# Patient Record
Sex: Female | Born: 1955
Health system: Southern US, Community
[De-identification: ages and names within clinical notes are randomized; demographics above are authoritative.]

## PROBLEM LIST (undated history)

## (undated) DIAGNOSIS — E039 Hypothyroidism, unspecified: Secondary | ICD-10-CM

## (undated) DIAGNOSIS — E7800 Pure hypercholesterolemia, unspecified: Secondary | ICD-10-CM

## (undated) DIAGNOSIS — K219 Gastro-esophageal reflux disease without esophagitis: Secondary | ICD-10-CM

## (undated) DIAGNOSIS — Z8719 Personal history of other diseases of the digestive system: Secondary | ICD-10-CM

## (undated) DIAGNOSIS — Z87448 Personal history of other diseases of urinary system: Secondary | ICD-10-CM

## (undated) DIAGNOSIS — O00109 Unspecified tubal pregnancy without intrauterine pregnancy: Secondary | ICD-10-CM

## (undated) DIAGNOSIS — L570 Actinic keratosis: Secondary | ICD-10-CM

## (undated) DIAGNOSIS — D1771 Benign lipomatous neoplasm of kidney: Secondary | ICD-10-CM

## (undated) DIAGNOSIS — R011 Cardiac murmur, unspecified: Secondary | ICD-10-CM

## (undated) DIAGNOSIS — I10 Essential (primary) hypertension: Secondary | ICD-10-CM

## (undated) DIAGNOSIS — K760 Fatty (change of) liver, not elsewhere classified: Secondary | ICD-10-CM

## (undated) DIAGNOSIS — D352 Benign neoplasm of pituitary gland: Secondary | ICD-10-CM

## (undated) DIAGNOSIS — E220 Acromegaly and pituitary gigantism: Secondary | ICD-10-CM

## (undated) DIAGNOSIS — Z973 Presence of spectacles and contact lenses: Secondary | ICD-10-CM

## (undated) HISTORY — DX: Actinic keratosis: L57.0

## (undated) HISTORY — DX: Unspecified tubal pregnancy without intrauterine pregnancy: O00.109

## (undated) HISTORY — DX: Pure hypercholesterolemia, unspecified: E78.00

## (undated) HISTORY — DX: Benign lipomatous neoplasm of kidney: D17.71

## (undated) HISTORY — DX: Benign neoplasm of pituitary gland: D35.2

## (undated) HISTORY — DX: Essential (primary) hypertension: I10

## (undated) HISTORY — DX: Hypothyroidism, unspecified: E03.9

## (undated) HISTORY — PX: DILATION AND CURETTAGE OF UTERUS: SHX78

## (undated) HISTORY — DX: Gastro-esophageal reflux disease without esophagitis: K21.9

## (undated) HISTORY — PX: APPENDECTOMY: SHX54

---

## 2001-01-26 HISTORY — PX: ABDOMINAL HYSTERECTOMY: SHX81

## 2003-01-27 HISTORY — PX: CHOLECYSTECTOMY: SHX55

## 2003-08-28 ENCOUNTER — Other Ambulatory Visit: Payer: Self-pay

## 2004-01-27 HISTORY — PX: OTHER SURGICAL HISTORY: SHX169

## 2004-04-15 ENCOUNTER — Inpatient Hospital Stay: Payer: Self-pay | Admitting: Internal Medicine

## 2004-06-19 ENCOUNTER — Ambulatory Visit: Payer: Self-pay | Admitting: Internal Medicine

## 2004-07-01 ENCOUNTER — Ambulatory Visit: Payer: Self-pay | Admitting: Neurosurgery

## 2004-12-04 ENCOUNTER — Ambulatory Visit: Payer: Self-pay | Admitting: Internal Medicine

## 2005-07-13 ENCOUNTER — Ambulatory Visit: Payer: Self-pay | Admitting: Internal Medicine

## 2005-07-14 ENCOUNTER — Ambulatory Visit: Payer: Self-pay | Admitting: Internal Medicine

## 2005-11-23 ENCOUNTER — Ambulatory Visit: Payer: Self-pay | Admitting: Neurosurgery

## 2006-03-30 ENCOUNTER — Ambulatory Visit: Payer: Self-pay | Admitting: Internal Medicine

## 2006-05-11 ENCOUNTER — Ambulatory Visit: Payer: Self-pay | Admitting: Surgery

## 2006-05-11 ENCOUNTER — Other Ambulatory Visit: Payer: Self-pay

## 2006-06-01 ENCOUNTER — Ambulatory Visit: Payer: Self-pay | Admitting: Surgery

## 2006-08-03 ENCOUNTER — Ambulatory Visit: Payer: Self-pay | Admitting: Internal Medicine

## 2007-01-10 ENCOUNTER — Ambulatory Visit: Payer: Self-pay | Admitting: Internal Medicine

## 2007-01-11 ENCOUNTER — Inpatient Hospital Stay: Payer: Self-pay | Admitting: Specialist

## 2007-02-17 ENCOUNTER — Ambulatory Visit: Payer: Self-pay | Admitting: Gastroenterology

## 2007-08-02 ENCOUNTER — Ambulatory Visit: Payer: Self-pay | Admitting: Neurosurgery

## 2007-08-25 ENCOUNTER — Ambulatory Visit: Payer: Self-pay | Admitting: Internal Medicine

## 2007-08-26 ENCOUNTER — Ambulatory Visit: Payer: Self-pay | Admitting: Internal Medicine

## 2008-03-22 ENCOUNTER — Ambulatory Visit: Payer: Self-pay | Admitting: Internal Medicine

## 2008-09-20 ENCOUNTER — Ambulatory Visit: Payer: Self-pay | Admitting: Unknown Physician Specialty

## 2008-10-24 ENCOUNTER — Ambulatory Visit: Payer: Self-pay | Admitting: Internal Medicine

## 2009-03-01 ENCOUNTER — Ambulatory Visit: Payer: Self-pay | Admitting: Internal Medicine

## 2009-03-26 ENCOUNTER — Ambulatory Visit: Payer: Self-pay | Admitting: Internal Medicine

## 2009-04-26 ENCOUNTER — Ambulatory Visit: Payer: Self-pay | Admitting: Internal Medicine

## 2009-05-26 ENCOUNTER — Ambulatory Visit: Payer: Self-pay | Admitting: Internal Medicine

## 2009-06-26 ENCOUNTER — Ambulatory Visit: Payer: Self-pay | Admitting: Internal Medicine

## 2009-07-26 ENCOUNTER — Ambulatory Visit: Payer: Self-pay | Admitting: Internal Medicine

## 2009-08-30 ENCOUNTER — Ambulatory Visit: Payer: Self-pay | Admitting: Internal Medicine

## 2009-09-26 ENCOUNTER — Ambulatory Visit: Payer: Self-pay | Admitting: Internal Medicine

## 2009-11-06 ENCOUNTER — Ambulatory Visit: Payer: Self-pay | Admitting: Internal Medicine

## 2009-12-04 ENCOUNTER — Ambulatory Visit: Payer: Self-pay | Admitting: Gastroenterology

## 2010-01-23 ENCOUNTER — Ambulatory Visit: Payer: Self-pay | Admitting: Internal Medicine

## 2010-07-28 ENCOUNTER — Ambulatory Visit: Payer: Self-pay | Admitting: Urology

## 2010-09-10 ENCOUNTER — Ambulatory Visit: Payer: Self-pay

## 2010-11-27 ENCOUNTER — Ambulatory Visit: Payer: Self-pay | Admitting: Internal Medicine

## 2011-04-22 ENCOUNTER — Emergency Department: Payer: Self-pay | Admitting: *Deleted

## 2011-04-22 LAB — CBC
HCT: 38.9 % (ref 35.0–47.0)
HGB: 13.3 g/dL (ref 12.0–16.0)
MCH: 31.4 pg (ref 26.0–34.0)
MCHC: 34.2 g/dL (ref 32.0–36.0)
RBC: 4.23 10*6/uL (ref 3.80–5.20)
RDW: 14.1 % (ref 11.5–14.5)

## 2011-04-22 LAB — BASIC METABOLIC PANEL
Anion Gap: 11 (ref 7–16)
Calcium, Total: 9.4 mg/dL (ref 8.5–10.1)
EGFR (Non-African Amer.): 51 — ABNORMAL LOW
Glucose: 100 mg/dL — ABNORMAL HIGH (ref 65–99)
Sodium: 143 mmol/L (ref 136–145)

## 2011-04-22 LAB — CK TOTAL AND CKMB (NOT AT ARMC): CK-MB: 1.6 ng/mL (ref 0.5–3.6)

## 2011-04-22 LAB — TROPONIN I: Troponin-I: <0.02 ng/mL

## 2011-10-27 ENCOUNTER — Telehealth: Payer: Self-pay | Admitting: Internal Medicine

## 2011-10-27 NOTE — Telephone Encounter (Signed)
Pt called wanting to know if you could call her in something for a sinus infection or does she need to be seen [facility]

## 2011-10-28 NOTE — Telephone Encounter (Signed)
Pt is wondering about refill request and she just went over to the walk-in clinic today.

## 2011-10-28 NOTE — Telephone Encounter (Signed)
I do rec an evaluation.  I can see her tomorrow afternoon at 2:30 - to be worked in for this problem.  I would like for her to bring in a list of her meds and while she is here if we can go ahead and give her a new pt packet.  Thanks.

## 2011-10-29 ENCOUNTER — Telehealth: Payer: Self-pay

## 2011-10-29 MED ORDER — ESTROGENS CONJUGATED 0.625 MG PO TABS: 0.6250 mg | ORAL_TABLET | Freq: Every day | ORAL | Status: DC

## 2011-10-29 NOTE — Telephone Encounter (Signed)
RX faxed- [facility]

## 2011-10-29 NOTE — Telephone Encounter (Signed)
Left a message for patient to return our call.

## 2011-10-29 NOTE — Telephone Encounter (Signed)
What meds does she need.  She has an appt scheduled with me, so i am ok to refill her regular meds until her appt. thanks

## 2011-11-05 NOTE — Telephone Encounter (Signed)
Did a follow up call on the patient medication request. Left vm for patient to call me back.

## 2011-11-24 ENCOUNTER — Other Ambulatory Visit: Payer: Self-pay | Admitting: *Deleted

## 2011-11-24 ENCOUNTER — Ambulatory Visit (INDEPENDENT_AMBULATORY_CARE_PROVIDER_SITE_OTHER): Payer: PRIVATE HEALTH INSURANCE | Admitting: Internal Medicine

## 2011-11-24 ENCOUNTER — Encounter: Payer: Self-pay | Admitting: Internal Medicine

## 2011-11-24 VITALS — BP 100/62 | HR 60 | Temp 98.2°F | Ht 65.0 in | Wt 176.0 lb

## 2011-11-24 DIAGNOSIS — Z139 Encounter for screening, unspecified: Secondary | ICD-10-CM

## 2011-11-24 DIAGNOSIS — D3000 Benign neoplasm of unspecified kidney: Secondary | ICD-10-CM

## 2011-11-24 DIAGNOSIS — D1771 Benign lipomatous neoplasm of kidney: Secondary | ICD-10-CM

## 2011-11-24 DIAGNOSIS — K7689 Other specified diseases of liver: Secondary | ICD-10-CM

## 2011-11-24 DIAGNOSIS — E7800 Pure hypercholesterolemia, unspecified: Secondary | ICD-10-CM

## 2011-11-24 DIAGNOSIS — K760 Fatty (change of) liver, not elsewhere classified: Secondary | ICD-10-CM

## 2011-11-24 DIAGNOSIS — E039 Hypothyroidism, unspecified: Secondary | ICD-10-CM

## 2011-11-24 DIAGNOSIS — D352 Benign neoplasm of pituitary gland: Secondary | ICD-10-CM

## 2011-11-24 DIAGNOSIS — I10 Essential (primary) hypertension: Secondary | ICD-10-CM

## 2011-11-24 MED ORDER — ESTRADIOL 0.5 MG PO TABS: 0.5000 mg | ORAL_TABLET | Freq: Every day | ORAL | Status: DC

## 2011-11-24 MED ORDER — PRAVASTATIN SODIUM 40 MG PO TABS: 40.0000 mg | ORAL_TABLET | Freq: Every day | ORAL | Status: DC

## 2011-11-24 NOTE — Patient Instructions (Addendum)
It ws nice seeing you today.  I am glad you have been doing well.  We will get you mammogram scheduled and I will give you the form for your labs.  We will notify you of the results - when they are available.

## 2011-11-25 ENCOUNTER — Telehealth: Payer: Self-pay | Admitting: *Deleted

## 2011-11-25 NOTE — Telephone Encounter (Signed)
[facility] pharmacy with refill request.

## 2011-12-05 ENCOUNTER — Encounter: Payer: Self-pay | Admitting: Internal Medicine

## 2011-12-05 DIAGNOSIS — D1771 Benign lipomatous neoplasm of kidney: Secondary | ICD-10-CM | POA: Insufficient documentation

## 2011-12-05 DIAGNOSIS — K760 Fatty (change of) liver, not elsewhere classified: Secondary | ICD-10-CM | POA: Insufficient documentation

## 2011-12-05 DIAGNOSIS — E039 Hypothyroidism, unspecified: Secondary | ICD-10-CM | POA: Insufficient documentation

## 2011-12-05 DIAGNOSIS — D352 Benign neoplasm of pituitary gland: Secondary | ICD-10-CM | POA: Insufficient documentation

## 2011-12-05 NOTE — Assessment & Plan Note (Signed)
Blood pressure under good control.  Same meds.  Check metabolic panel with next labs.    

## 2011-12-05 NOTE — Assessment & Plan Note (Signed)
On Synthroid.  Check tsh with next labs.  

## 2011-12-05 NOTE — Progress Notes (Signed)
  Subjective:    Patient ID: Marie Colon, female    DOB: [birth year], 56 y.o.   MRN: [id number]  HPI 56 year old female with past history of hypercholesterolemia, hypertension, hypothyroidism, abnormal liver function and a pituitary macroadenoma with associated acromegaly (elevated growth hormone).  She comes in today to follow up on these issues as well as for a complete physical exam.  Overally doing well.  Recently had a sinus infection.  Treated with a Zpak.  Doing better. No increased cough or congestion currently.  Breathing stable.   Still seeing Dr Tedd Sias. Last visit 8/13.  Has follow up planned in 2/14.  Labs stable - per report.  Wants to change premarin to estradiol.  Cheaper.  Handling stress relatively well.  Overall she feels she is doing well.   Past Medical History  Diagnosis Date  . Hypertension   . Hypercholesterolemia   . Hypothyroidism   . Pituitary macroadenoma     s/p removal (elevated growth hormone)  . GERD (gastroesophageal reflux disease)   . Tubal pregnancy     s/p rupture  . Angiomyolipoma of kidney     evaluated by Dr Lonna Cobb    Review of Systems Patient denies any headache, lightheadedness or dizziness.  No significant sinus or allergy symptoms currently.   No chest pain, tightness or palpitations.  No increased shortness of breath, cough or congestion.  Reflux controlled.   No nausea or vomiting.  No abdominal pain or cramping.  No bowel change, such as diarrhea, constipation, BRBPR or melana.  No urine change.        Objective:   Physical Exam Filed Vitals:   11/24/11 0855  BP: 100/62  Pulse: 60  Temp: 98.2 F (34.60 C)   56 year old female in no acute distress.   HEENT:  Nares- clear.  Oropharynx - without lesions. NECK:  Supple.  Nontender.  No audible bruit.  HEART:  Appears to be regular. LUNGS:  No crackles or wheezing audible.  Respirations even and unlabored.  RADIAL PULSE:  Equal bilaterally.    BREASTS:  No nipple discharge or nipple  retraction present.  Could not appreciate any distinct nodules or axillary adenopathy.  ABDOMEN:  Soft, nontender.  Bowel sounds present and normal.  No audible abdominal bruit.  GU:  Normal external genitalia.  Vaginal vault without lesions.  S/P hysterectomy.  Could not appreciate any adnexal masses or tenderness.   RECTAL:  Heme negative.   EXTREMITIES:  No increased edema present.  DP pulses palpable and equal bilaterally.           Assessment & Plan:  CARDIOVASCULAR.  Currently asymptomatic.  Continue risk factor modification.  Stress echo 07/29/11-  Normal stress ECHO with no evidence of ischemia.    INCREASED PSYCHOSOCIAL STRESSORS.  Doing well.  Follow.   GYN.  Wants to remain on estrogen.  Wants to change premarin to Estradiol.  Will change to .5mg  of Estradiol.  Follow.     HEALTH MAINTENANCE.  Last mammogram 11/27/10 - BiRADS I.  Schedule mammogram when due.  Is s/p hysterectomy.  Will notify me when agreeable for bone density.

## 2011-12-05 NOTE — Assessment & Plan Note (Signed)
Liver function has been stable.  Follows with GI.  Abdominal ultrasound 12/04/09 - fatty liver.  Follow liver function.

## 2011-12-05 NOTE — Assessment & Plan Note (Signed)
Found incidentally on a previous CT/ultrasound.  Last CT (07/28/10) revealed - lower pole right renal angiomyolipoma.  Reviewed by Dr Stoioff.  Rec follow up periodic ultrasound.   

## 2011-12-05 NOTE — Assessment & Plan Note (Addendum)
S/P removal (Dr Dwyane Luo) at [facility].  Elevated growth hormone.  History of acromegaly.  Followed by Dr Tedd Sias.  On Cabergoline.  Doing well.  Last MRI (pituitary) - revealed no evidence of residual or recurrent disease - (09/10/10).  Has follow up planned with Dr Tedd Sias 2/14.

## 2011-12-05 NOTE — Assessment & Plan Note (Signed)
On Pravastatin.  Low cholesterol diet and exercise.  Follow lipid panel and liver function.    

## 2011-12-14 ENCOUNTER — Encounter: Payer: Self-pay | Admitting: Internal Medicine

## 2011-12-14 DIAGNOSIS — Z8371 Family history of colonic polyps: Secondary | ICD-10-CM

## 2011-12-14 DIAGNOSIS — K2970 Gastritis, unspecified, without bleeding: Secondary | ICD-10-CM

## 2011-12-18 ENCOUNTER — Telehealth: Payer: Self-pay | Admitting: Internal Medicine

## 2011-12-18 NOTE — Telephone Encounter (Signed)
Triamterene tctz 37.5-25 90 tab [facility]

## 2011-12-18 NOTE — Telephone Encounter (Signed)
metoprolol tartrate 50mg  180 tab Take one tablet by mouth 2 times day [facility]

## 2011-12-21 ENCOUNTER — Ambulatory Visit: Payer: Self-pay | Admitting: Internal Medicine

## 2011-12-21 ENCOUNTER — Telehealth: Payer: Self-pay | Admitting: General Practice

## 2011-12-21 NOTE — Telephone Encounter (Signed)
Pt called stating she needs Rx for Metroprolol and Traimterene/HCTZ sent to [facility]. Also states that if possible could she go ahead and get refills for the remainder of her meds as well. Please send Faxed meds to [phone].

## 2011-12-22 ENCOUNTER — Other Ambulatory Visit: Payer: Self-pay | Admitting: *Deleted

## 2011-12-22 ENCOUNTER — Telehealth: Payer: Self-pay | Admitting: *Deleted

## 2011-12-22 MED ORDER — TRIAMTERENE-HCTZ 37.5-25 MG PO CAPS: 1.0000 | ORAL_CAPSULE | Freq: Every day | ORAL | Status: DC

## 2011-12-22 MED ORDER — LEVOTHYROXINE SODIUM 100 MCG PO TABS: 100.0000 ug | ORAL_TABLET | Freq: Every day | ORAL | Status: DC

## 2011-12-22 MED ORDER — PRAVASTATIN SODIUM 40 MG PO TABS: 40.0000 mg | ORAL_TABLET | Freq: Every day | ORAL | Status: DC

## 2011-12-22 MED ORDER — LANSOPRAZOLE 15 MG PO CPDR: 15.0000 mg | DELAYED_RELEASE_CAPSULE | Freq: Every day | ORAL | Status: DC

## 2011-12-22 MED ORDER — ESTRADIOL 0.5 MG PO TABS: 0.5000 mg | ORAL_TABLET | Freq: Every day | ORAL | Status: DC

## 2011-12-22 MED ORDER — METOPROLOL TARTRATE 50 MG PO TABS: 50.0000 mg | ORAL_TABLET | Freq: Two times a day (BID) | ORAL | Status: DC

## 2011-12-22 NOTE — Telephone Encounter (Signed)
Refilled Lopressor 50 mg bid and Dyazide 37.5 - 25 mg daily

## 2011-12-22 NOTE — Telephone Encounter (Signed)
Refilled patient scripts

## 2011-12-23 ENCOUNTER — Telehealth: Payer: Self-pay | Admitting: Internal Medicine

## 2011-12-23 MED ORDER — METOPROLOL TARTRATE 50 MG PO TABS: 50.0000 mg | ORAL_TABLET | Freq: Two times a day (BID) | ORAL | Status: DC

## 2011-12-23 MED ORDER — TRIAMTERENE-HCTZ 37.5-25 MG PO CAPS: 1.0000 | ORAL_CAPSULE | Freq: Every day | ORAL | Status: DC

## 2011-12-23 NOTE — Telephone Encounter (Signed)
Called and gave lab results to patient.  

## 2011-12-23 NOTE — Telephone Encounter (Signed)
Notify pt mammo ok.  

## 2012-01-21 ENCOUNTER — Encounter: Payer: Self-pay | Admitting: Internal Medicine

## 2012-02-03 ENCOUNTER — Ambulatory Visit (INDEPENDENT_AMBULATORY_CARE_PROVIDER_SITE_OTHER): Payer: 59 | Admitting: Adult Health

## 2012-02-03 ENCOUNTER — Encounter: Payer: Self-pay | Admitting: Adult Health

## 2012-02-03 VITALS — BP 109/74 | HR 60 | Temp 97.8°F | Resp 16 | Ht 64.0 in | Wt 180.0 lb

## 2012-02-03 DIAGNOSIS — N390 Urinary tract infection, site not specified: Secondary | ICD-10-CM

## 2012-02-03 DIAGNOSIS — R109 Unspecified abdominal pain: Secondary | ICD-10-CM

## 2012-02-03 LAB — POCT URINALYSIS DIPSTICK
Blood, UA: NEGATIVE
Glucose, UA: NEGATIVE
Ketones, UA: NEGATIVE
Spec Grav, UA: 1.02

## 2012-02-03 MED ORDER — CIPROFLOXACIN HCL 500 MG PO TABS: ORAL_TABLET | ORAL | Status: DC

## 2012-02-03 MED ORDER — POLYETHYLENE GLYCOL 3350 17 GM/SCOOP PO POWD: ORAL | Status: DC

## 2012-02-03 NOTE — Progress Notes (Signed)
Subjective:    Patient ID: Marie Colon, female    DOB: [birth year], 57 y.o.   MRN: [id number]  HPI  Patient presents to clinic with c/o mild right flank pain. This morning pain has started to radiate towards the front. Denies hx of kidney stones or UTI. She reports a kidney infection back when she was in her early 50s. Reports slight nausea last night. Denies fever, chills. Patient has not had a BM in 4 days. She is voiding without any difficulty.    Current Outpatient Prescriptions on File Prior to Visit  Medication Sig Dispense Refill  . aspirin 81 MG chewable tablet Chew 81 mg by mouth daily.      . cabergoline (DOSTINEX) 0.5 MG tablet Take 0.5 mg by mouth Nightly.       . docusate sodium (COLACE) 100 MG capsule Take 100 mg by mouth daily.      . lansoprazole (PREVACID) 15 MG capsule Take 1 capsule (15 mg total) by mouth daily.  90 capsule  3  . levothyroxine (SYNTHROID, LEVOTHROID) 100 MCG tablet Take 1 tablet (100 mcg total) by mouth daily.  90 tablet  3  . metoprolol (LOPRESSOR) 50 MG tablet Take 1 tablet (50 mg total) by mouth 2 (two) times daily.  60 tablet  3  . Multiple Vitamin (MULTIVITAMIN) tablet Take 1 tablet by mouth daily.      . pravastatin (PRAVACHOL) 40 MG tablet Take 1 tablet (40 mg total) by mouth daily.  90 tablet  3  . ranitidine (ZANTAC) 75 MG tablet Take 75 mg by mouth at bedtime.      . triamterene-hydrochlorothiazide (DYAZIDE) 37.5-25 MG per capsule Take 1 each (1 capsule total) by mouth daily.  30 capsule  3  . estradiol (ESTRACE) 0.5 MG tablet Take 1 tablet (0.5 mg total) by mouth daily.  90 tablet  3  . pravastatin (PRAVACHOL) 40 MG tablet Take 1 tablet (40 mg total) by mouth daily.  90 tablet  2     Review of Systems  Constitutional: Positive for appetite change. Negative for fever and chills.  HENT: Negative.   Eyes: Negative.   Respiratory: Negative.   Cardiovascular: Negative.   Gastrointestinal: Positive for nausea. Negative for vomiting.    Genitourinary: Positive for frequency and flank pain. Negative for dysuria, urgency and pelvic pain.  Musculoskeletal: Negative.        Denies any activity that may have contributed to a pulled or sprained muscle.  Neurological: Negative.   Psychiatric/Behavioral: Negative.     Pulse 60  Temp 97.8 F (36.6 C) (Oral)  Ht 5\' 4"  (1.626 m)  Wt 180 lb (81.647 kg)  BMI 30.90 kg/m2  SpO2 100%      Objective:   Physical Exam  Constitutional: She appears well-developed and well-nourished. No distress.  HENT:  Head: Normocephalic and atraumatic.  Eyes: Pupils are equal, round, and reactive to light.  Cardiovascular: Normal rate, regular rhythm and normal heart sounds.  Exam reveals no gallop.   No murmur heard. Pulmonary/Chest: Effort normal and breath sounds normal. She has no wheezes. She has no rales.  Abdominal: Soft. Bowel sounds are normal. There is tenderness.       Right lower quadrant tenderness upon palpation. Last BM was Saturday.  Musculoskeletal: Normal range of motion.  Skin: Skin is warm and dry.  Psychiatric: She has a normal mood and affect. Her behavior is normal. Judgment and thought content normal.       Assessment & Plan:

## 2012-02-03 NOTE — Assessment & Plan Note (Signed)
Kidney infection (?) Patient has not had a BM in 4 days. Her mild symptoms could be from bowel fullness. Suggested miralax to help get her bowels moving and see if this helps. Also gave patient prescription for Cipro in the event she develops a fever, chills, intensification of pain. UA dipstick was negative. Urine also sent for culture.

## 2012-02-03 NOTE — Patient Instructions (Addendum)
  Miralax is safe to take daily. It is not habit forming and it is completely tasteless. Add the appropriate dose to your fluid of choice. Try to drink fluid throughout the day to also help with constipation.  I have given you a prescription for Cipro 500 mg. Fill this if you start to develop a fever, chills, nausea, vomiting.   If you do start to take the Cipro, please call if your symptoms do not improve within 3-4 days.

## 2012-02-05 LAB — URINE CULTURE
Colony Count: NO GROWTH
Organism ID, Bacteria: NO GROWTH

## 2012-03-01 ENCOUNTER — Other Ambulatory Visit: Payer: Self-pay | Admitting: Internal Medicine

## 2012-03-02 LAB — BASIC METABOLIC PANEL
BUN: 16 mg/dL (ref 6–24)
Calcium: 9.6 mg/dL (ref 8.7–10.2)
Creatinine, Ser: 1.14 mg/dL — ABNORMAL HIGH (ref 0.57–1.00)
GFR calc non Af Amer: 54 mL/min/{1.73_m2} — ABNORMAL LOW (ref >59)
Glucose: 92 mg/dL (ref 65–99)
Potassium: 3.7 mmol/L (ref 3.5–5.2)

## 2012-03-02 LAB — CBC WITH DIFFERENTIAL
Basos: 0 % (ref 0–3)
Eos: 2 % (ref 0–5)
Hemoglobin: 13.9 g/dL (ref 11.1–15.9)
Immature Grans (Abs): 0 10*3/uL (ref 0.0–0.1)
Lymphs: 25 % (ref 14–46)
Monocytes: 9 % (ref 4–12)
Neutrophils Relative %: 64 % (ref 40–74)
RBC: 4.44 x10E6/uL (ref 3.77–5.28)

## 2012-03-02 LAB — HEPATIC FUNCTION PANEL
ALT: 68 IU/L — ABNORMAL HIGH (ref 0–32)
AST: 44 IU/L — ABNORMAL HIGH (ref 0–40)
Total Bilirubin: 0.3 mg/dL (ref 0.0–1.2)

## 2012-03-02 LAB — LIPID PANEL W/O CHOL/HDL RATIO
Cholesterol, Total: 216 mg/dL — ABNORMAL HIGH (ref 100–199)
HDL: 40 mg/dL (ref >39)
Triglycerides: 227 mg/dL — ABNORMAL HIGH (ref 0–149)
VLDL Cholesterol Cal: 45 mg/dL — ABNORMAL HIGH (ref 5–40)

## 2012-03-02 LAB — TSH: TSH: 3.85 u[IU]/mL (ref 0.450–4.500)

## 2012-03-06 ENCOUNTER — Telehealth: Payer: Self-pay | Admitting: Internal Medicine

## 2012-03-06 NOTE — Telephone Encounter (Signed)
Notified of labs via my chart 

## 2012-03-12 ENCOUNTER — Other Ambulatory Visit: Payer: Self-pay

## 2012-03-25 ENCOUNTER — Ambulatory Visit (INDEPENDENT_AMBULATORY_CARE_PROVIDER_SITE_OTHER): Payer: 59 | Admitting: Internal Medicine

## 2012-03-25 ENCOUNTER — Encounter: Payer: Self-pay | Admitting: Internal Medicine

## 2012-03-25 VITALS — BP 120/74 | HR 58 | Temp 98.4°F | Ht 65.0 in | Wt 179.8 lb

## 2012-03-25 DIAGNOSIS — K7689 Other specified diseases of liver: Secondary | ICD-10-CM

## 2012-03-25 DIAGNOSIS — E7800 Pure hypercholesterolemia, unspecified: Secondary | ICD-10-CM

## 2012-03-25 DIAGNOSIS — E039 Hypothyroidism, unspecified: Secondary | ICD-10-CM

## 2012-03-25 DIAGNOSIS — D352 Benign neoplasm of pituitary gland: Secondary | ICD-10-CM

## 2012-03-25 DIAGNOSIS — K760 Fatty (change of) liver, not elsewhere classified: Secondary | ICD-10-CM

## 2012-03-25 DIAGNOSIS — I10 Essential (primary) hypertension: Secondary | ICD-10-CM

## 2012-03-27 ENCOUNTER — Encounter: Payer: Self-pay | Admitting: Internal Medicine

## 2012-03-27 NOTE — Assessment & Plan Note (Signed)
Found incidentally on a previous CT/ultrasound.  Last CT (07/28/10) revealed - lower pole right renal angiomyolipoma.  Reviewed by Dr Stoioff.  Rec follow up periodic ultrasound.   

## 2012-03-27 NOTE — Assessment & Plan Note (Signed)
On Synthroid.  TSH 03/01/12 - wnl.

## 2012-03-27 NOTE — Assessment & Plan Note (Signed)
On Pravastatin.  Low cholesterol diet and exercise.  Follow lipid panel and liver function.    

## 2012-03-27 NOTE — Progress Notes (Signed)
Subjective:    Patient ID: Marie Colon, female    DOB: July 27, 1955, 57 y.o.   MRN: [id number]  HPI 57 year old female with past history of hypercholesterolemia, hypertension, hypothyroidism, abnormal liver function and a pituitary macroadenoma with associated acromegaly (elevated growth hormone).  She comes in today for a scheduled follow up.  Overally doing well.  No increased cough or congestion currently.  Breathing stable.   Still seeing Dr Tedd Sias.  Just evaluated 2/14.  Labs stable. Handling stress relatively well.  Overall she feels she is doing well.  She reports having a rash under her breast.  Itches sometimes.     Past Medical History  Diagnosis Date  . Hypertension   . Hypercholesterolemia   . Hypothyroidism   . Pituitary macroadenoma     s/p removal (elevated growth hormone)  . GERD (gastroesophageal reflux disease)   . Tubal pregnancy     s/p rupture  . Angiomyolipoma of kidney     evaluated by Dr Lonna Cobb    Outpatient Encounter Prescriptions as of 03/25/2012  Medication Sig Dispense Refill  . aspirin 81 MG chewable tablet Chew 81 mg by mouth daily.      . cabergoline (DOSTINEX) 0.5 MG tablet Take 0.5 mg by mouth Nightly.       . docusate sodium (COLACE) 100 MG capsule Take 100 mg by mouth daily.      Marland Kitchen estradiol (ESTRACE) 0.5 MG tablet Take 1 tablet (0.5 mg total) by mouth daily.  90 tablet  3  . lansoprazole (PREVACID) 15 MG capsule Take 1 capsule (15 mg total) by mouth daily.  90 capsule  3  . levothyroxine (SYNTHROID, LEVOTHROID) 100 MCG tablet Take 1 tablet (100 mcg total) by mouth daily.  90 tablet  3  . metoprolol (LOPRESSOR) 50 MG tablet Take 1 tablet (50 mg total) by mouth 2 (two) times daily.  60 tablet  3  . Multiple Vitamin (MULTIVITAMIN) tablet Take 1 tablet by mouth daily.      . pravastatin (PRAVACHOL) 40 MG tablet Take 1 tablet (40 mg total) by mouth daily.  90 tablet  2  . ranitidine (ZANTAC) 75 MG tablet Take 75 mg by mouth at bedtime.      .  triamterene-hydrochlorothiazide (DYAZIDE) 37.5-25 MG per capsule Take 1 each (1 capsule total) by mouth daily.  30 capsule  3  . ciprofloxacin (CIPRO) 500 MG tablet Take 1 table twice daily for 7 days.  14 tablet  0  . polyethylene glycol powder (GLYCOLAX/MIRALAX) powder Take with your choice of fluid. This is safe to use on a daily basis if needed.  3350 g  1  . pravastatin (PRAVACHOL) 40 MG tablet Take 1 tablet (40 mg total) by mouth daily.  90 tablet  3   No facility-administered encounter medications on file as of 03/25/2012.    Review of Systems Patient denies any headache, lightheadedness or dizziness.  No significant sinus or allergy symptoms currently.   No chest pain, tightness or palpitations.  No increased shortness of breath, cough or congestion.  Reflux controlled.   No nausea or vomiting.  No abdominal pain or cramping.  No bowel change, such as diarrhea, constipation, BRBPR or melana.  No urine change.        Objective:   Physical Exam  Filed Vitals:   03/25/12 0806  BP: 120/74  Pulse: 58  Temp: 98.4 F (36.9 C)   Blood pressure recheck:  116/74, pulse 51  57 year old female in  no acute distress.   HEENT:  Nares- clear.  Oropharynx - without lesions. NECK:  Supple.  Nontender.  No audible bruit.  HEART:  Appears to be regular. LUNGS:  No crackles or wheezing audible.  Respirations even and unlabored.  RADIAL PULSE:  Equal bilaterally.    BREASTS:  Minimal erythematous based rash beneath both breasts.  Appears to be c/w yeast.   ABDOMEN:  Soft, nontender.  Bowel sounds present and normal.  No audible abdominal bruit.  EXTREMITIES:  No increased edema present.  DP pulses palpable and equal bilaterally.           Assessment & Plan:  CARDIOVASCULAR.  Currently asymptomatic.  Continue risk factor modification.  Stress echo 07/29/11-  Normal stress ECHO with no evidence of ischemia.    INCREASED PSYCHOSOCIAL STRESSORS.  Doing well.  Follow.   GYN.  Wants to remain on  estrogen.  On estradiol now.  Follow.  RASH.  Appears to be consistent with a yeast infection.  Nystatin cream and powder as directed.  Follow.       HEALTH MAINTENANCE.  Last mammogram 12/21/11 - BiRADS II.  Is s/p hysterectomy.  Will notify me when agreeable for bone density.

## 2012-03-27 NOTE — Assessment & Plan Note (Signed)
S/P removal (Dr Dwyane Luo) at [facility].  Elevated growth hormone.  History of acromegaly.  Followed by Dr Tedd Sias.  On Cabergoline.  Doing well.  Last MRI (pituitary) - revealed no evidence of residual or recurrent disease - (09/10/10).  Just evaluated by Dr Tedd Sias.  Stable.

## 2012-03-27 NOTE — Assessment & Plan Note (Signed)
Blood pressure under good control.  Same meds.  Follow metabolic panel.  Cr 03/01/12 - 1.14.

## 2012-03-27 NOTE — Assessment & Plan Note (Signed)
Liver function has been stable.  Follows with GI.  Abdominal ultrasound 12/04/09 - fatty liver.  Follow liver function.  Liver panel just checked 03/01/12 revealed ast 44 and alt 65.

## 2012-04-12 ENCOUNTER — Encounter: Payer: Self-pay | Admitting: Internal Medicine

## 2012-04-12 NOTE — Telephone Encounter (Signed)
Patient to be worked in tomorrow 04/13/12 at 10:45 am. Patient notified.

## 2012-04-13 ENCOUNTER — Other Ambulatory Visit: Payer: Self-pay | Admitting: *Deleted

## 2012-04-13 ENCOUNTER — Ambulatory Visit (INDEPENDENT_AMBULATORY_CARE_PROVIDER_SITE_OTHER): Payer: 59 | Admitting: Internal Medicine

## 2012-04-13 ENCOUNTER — Encounter: Payer: Self-pay | Admitting: Internal Medicine

## 2012-04-13 VITALS — BP 110/70 | HR 64 | Temp 98.3°F | Ht 65.0 in | Wt 182.2 lb

## 2012-04-13 DIAGNOSIS — I10 Essential (primary) hypertension: Secondary | ICD-10-CM

## 2012-04-13 MED ORDER — TRIAMTERENE-HCTZ 37.5-25 MG PO CAPS: 1.0000 | ORAL_CAPSULE | Freq: Every day | ORAL | Status: DC

## 2012-04-13 MED ORDER — AZITHROMYCIN 250 MG PO TABS: ORAL_TABLET | ORAL | Status: DC

## 2012-04-13 MED ORDER — METOPROLOL TARTRATE 50 MG PO TABS: 50.0000 mg | ORAL_TABLET | Freq: Two times a day (BID) | ORAL | Status: DC

## 2012-04-13 NOTE — Progress Notes (Signed)
Subjective:    Patient ID: Marie Colon, female    DOB: 1955/10/14, 57 y.o.   MRN: [id number]  Sinusitis  57 year old female with past history of hypercholesterolemia, hypertension, hypothyroidism, abnormal liver function and a pituitary macroadenoma with associated acromegaly (elevated growth hormone).  She comes in today as a work in with concerns regarding a possible sinus infection.  States symptoms started several days ago.  Increased sinus pressure.  Ears feel stopped up.  Hurts into her teeth.  Tickling in her throat.  Minimal cough.  No increased chest congestion or sob.  Has been taking an otc sinus/allergy pill.     Past Medical History  Diagnosis Date  . Hypertension   . Hypercholesterolemia   . Hypothyroidism   . Pituitary macroadenoma     s/p removal (elevated growth hormone)  . GERD (gastroesophageal reflux disease)   . Tubal pregnancy     s/p rupture  . Angiomyolipoma of kidney     evaluated by Dr Lonna Cobb    Outpatient Encounter Prescriptions as of 04/13/2012  Medication Sig Dispense Refill  . aspirin 81 MG chewable tablet Chew 81 mg by mouth daily.      . cabergoline (DOSTINEX) 0.5 MG tablet Take 0.5 mg by mouth Nightly.       . docusate sodium (COLACE) 100 MG capsule Take 100 mg by mouth daily.      Marland Kitchen estradiol (ESTRACE) 0.5 MG tablet Take 1 tablet (0.5 mg total) by mouth daily.  90 tablet  3  . lansoprazole (PREVACID) 15 MG capsule Take 1 capsule (15 mg total) by mouth daily.  90 capsule  3  . levothyroxine (SYNTHROID, LEVOTHROID) 100 MCG tablet Take 1 tablet (100 mcg total) by mouth daily.  90 tablet  3  . Multiple Vitamin (MULTIVITAMIN) tablet Take 1 tablet by mouth daily.      . polyethylene glycol powder (GLYCOLAX/MIRALAX) powder Take with your choice of fluid. This is safe to use on a daily basis if needed.  3350 g  1  . pravastatin (PRAVACHOL) 40 MG tablet Take 1 tablet (40 mg total) by mouth daily.  90 tablet  3  . ranitidine (ZANTAC) 150 MG capsule Take  150 mg by mouth every evening.      . [DISCONTINUED] metoprolol (LOPRESSOR) 50 MG tablet Take 1 tablet (50 mg total) by mouth 2 (two) times daily.  60 tablet  3  . [DISCONTINUED] triamterene-hydrochlorothiazide (DYAZIDE) 37.5-25 MG per capsule Take 1 each (1 capsule total) by mouth daily.  30 capsule  3  . azithromycin (ZITHROMAX) 250 MG tablet Take 2 pills x 1 day and then 1 pill q day x 4 more days.  6 tablet  0  . ciprofloxacin (CIPRO) 500 MG tablet Take 1 table twice daily for 7 days.  14 tablet  0  . [DISCONTINUED] pravastatin (PRAVACHOL) 40 MG tablet Take 1 tablet (40 mg total) by mouth daily.  90 tablet  2  . [DISCONTINUED] ranitidine (ZANTAC) 75 MG tablet Take 75 mg by mouth at bedtime.       No facility-administered encounter medications on file as of 04/13/2012.    Review of Systems Patient denies any headache, lightheadedness or dizziness.  Does report the sinus congestion and pressure as outlined.  Increased nasal congestion.   No chest pain, tightness or palpitations.  No increased shortness of breath.  Minimal cough.  No chest congestion.  Reflux controlled.   No nausea or vomiting.  No abdominal pain or cramping.  Decreased appetite.  No bowel change, such as diarrhea, constipation, BRBPR or melana.  No urine change.        Objective:   Physical Exam  Filed Vitals:   04/13/12 1042  BP: 110/70  Pulse: 64  Temp: 98.3 F (31.69 C)   57 year old female in no acute distress.   HEENT:  Nares- slightly erythematous turbinates.  Increased tenderness to palpation over the maxillary sinus > frontal sinus.  Oropharynx - without lesions. NECK:  Supple.  Minimal tenderness to palpation.  No audible bruit.  HEART:  Appears to be regular. LUNGS:  No crackles or wheezing audible.  Respirations even and unlabored.  RADIAL PULSE:  Equal bilaterally.             Assessment & Plan:  SINUSITIS.  Takes Zpak and this works well for her.  Will treat with Zpak as directed.  Saline nasal flushes  and nasonex as directed.  mucinex in the am and robitussin in the evening.  Rest.  Fluids.  Explained to her if symptoms changed or worsened - she was to be reevaluated.    CARDIOVASCULAR.  Currently asymptomatic.  Continue risk factor modification.  Stress echo 07/29/11-  Normal stress ECHO with no evidence of ischemia.   HEALTH MAINTENANCE.  Last mammogram 12/21/11 - BiRADS II.  Is s/p hysterectomy.  Will notify me when agreeable for bone density.

## 2012-04-13 NOTE — Assessment & Plan Note (Signed)
Blood pressure under good control.  Same meds.  Follow metabolic panel.  Cr 03/01/12 - 1.14.

## 2012-04-13 NOTE — Patient Instructions (Signed)
I am going to give you a Zpak to take as directed.  Use your saline nasal spray - flush nose at least 2-3x/day.   nasonex - 2 sprays each nostril one time per day - do in the evening.  mucinex in the am and robitussin in the evening.  Let me know if any problems.

## 2012-04-13 NOTE — Telephone Encounter (Signed)
Sent in to pharmacy.  

## 2012-06-21 ENCOUNTER — Encounter: Payer: Self-pay | Admitting: Internal Medicine

## 2012-06-21 ENCOUNTER — Telehealth: Payer: Self-pay | Admitting: *Deleted

## 2012-06-21 ENCOUNTER — Ambulatory Visit (INDEPENDENT_AMBULATORY_CARE_PROVIDER_SITE_OTHER): Payer: 59 | Admitting: Internal Medicine

## 2012-06-21 VITALS — BP 112/80 | HR 73 | Temp 98.9°F | Ht 65.0 in | Wt 175.2 lb

## 2012-06-21 DIAGNOSIS — R109 Unspecified abdominal pain: Secondary | ICD-10-CM

## 2012-06-21 LAB — CBC WITH DIFFERENTIAL/PLATELET
Basophils Absolute: 0 10*3/uL (ref 0.0–0.1)
HCT: 43.1 % (ref 36.0–46.0)
Lymphs Abs: 1.3 10*3/uL (ref 0.7–4.0)
MCV: 93.1 fl (ref 78.0–100.0)
Monocytes Absolute: 0.5 10*3/uL (ref 0.1–1.0)
Platelets: 210 10*3/uL (ref 150.0–400.0)
RDW: 14.3 % (ref 11.5–14.6)

## 2012-06-21 MED ORDER — ONDANSETRON HCL 8 MG PO TABS: 8.0000 mg | ORAL_TABLET | Freq: Two times a day (BID) | ORAL | Status: DC | PRN

## 2012-06-21 NOTE — Telephone Encounter (Signed)
Pt stopped by office, had nausea and loose, "runny" stools yesterday, increased fatigue. This morning, has had 4 loose stools, almost black, drinking small amounts of water. Complaining of bloating and upper abdominal discomfort. Appointment scheduled with Dr. Lorin Picket today at 10:15. BP 100/70, HR 74, temp 98.2.

## 2012-06-23 ENCOUNTER — Telehealth: Payer: Self-pay

## 2012-06-23 NOTE — Telephone Encounter (Signed)
MyChart Message: Your complete blood count was normal. (hemoglobin and white blood cell count normal). Let me know if you continue to have problems.   Left message for patient to call office back

## 2012-06-27 ENCOUNTER — Encounter: Payer: Self-pay | Admitting: Internal Medicine

## 2012-06-27 NOTE — Progress Notes (Signed)
Subjective:    Patient ID: Marie Colon, female    DOB: 1955/11/09, 57 y.o.   MRN: [id number]  Diarrhea   57 year old female with past history of hypercholesterolemia, hypertension, hypothyroidism, abnormal liver function and a pituitary macroadenoma with associated acromegaly (elevated growth hormone).  She comes in today as a work in with concerns regarding nausea and diarrhea.  States symptoms started yesterday.  Had nausea and diarrhea.  Took some pepto bismol.  Had approximately eight stools yesterday.  Decreased appetite.  Felt bloated.  Nausea is better today.  Has eaten some yogurt. Still with diarrhea.  She was concerned because her stool turned dark.  She states it is now getting back to a lighter brown.      Past Medical History  Diagnosis Date  . Hypertension   . Hypercholesterolemia   . Hypothyroidism   . Pituitary macroadenoma     s/p removal (elevated growth hormone)  . GERD (gastroesophageal reflux disease)   . Tubal pregnancy     s/p rupture  . Angiomyolipoma of kidney     evaluated by Dr Lonna Cobb    Outpatient Encounter Prescriptions as of 06/21/2012  Medication Sig Dispense Refill  . aspirin 81 MG chewable tablet Chew 81 mg by mouth daily.      . cabergoline (DOSTINEX) 0.5 MG tablet Take 0.5 mg by mouth Nightly.       . docusate sodium (COLACE) 100 MG capsule Take 100 mg by mouth daily.      Marland Kitchen estradiol (ESTRACE) 0.5 MG tablet Take 1 tablet (0.5 mg total) by mouth daily.  90 tablet  3  . levothyroxine (SYNTHROID, LEVOTHROID) 100 MCG tablet Take 1 tablet (100 mcg total) by mouth daily.  90 tablet  3  . metoprolol (LOPRESSOR) 50 MG tablet Take 1 tablet (50 mg total) by mouth 2 (two) times daily.  60 tablet  5  . Multiple Vitamin (MULTIVITAMIN) tablet Take 1 tablet by mouth daily.      . polyethylene glycol powder (GLYCOLAX/MIRALAX) powder Take with your choice of fluid. This is safe to use on a daily basis if needed.  3350 g  1  . pravastatin (PRAVACHOL) 40 MG tablet  Take 1 tablet (40 mg total) by mouth daily.  90 tablet  3  . ranitidine (ZANTAC) 150 MG capsule Take 150 mg by mouth every evening.      . triamterene-hydrochlorothiazide (DYAZIDE) 37.5-25 MG per capsule Take 1 each (1 capsule total) by mouth daily.  30 capsule  5  . [DISCONTINUED] azithromycin (ZITHROMAX) 250 MG tablet Take 2 pills x 1 day and then 1 pill q day x 4 more days.  6 tablet  0  . [DISCONTINUED] ciprofloxacin (CIPRO) 500 MG tablet Take 1 table twice daily for 7 days.  14 tablet  0  . lansoprazole (PREVACID) 15 MG capsule Take 1 capsule (15 mg total) by mouth daily.  90 capsule  3  . ondansetron (ZOFRAN) 8 MG tablet Take 1 tablet (8 mg total) by mouth every 12 (twelve) hours as needed for nausea.  10 tablet  0   No facility-administered encounter medications on file as of 06/21/2012.    Review of Systems  Gastrointestinal: Positive for diarrhea.  Patient denies any headache, lightheadedness or dizziness.  No significant sinus or allergy symptoms currently.   No chest pain, tightness or palpitations.  No increased shortness of breath, cough or congestion.  Reflux controlled.   No vomiting. Nausea is better today.  Still with decreased  appetite. No abdominal pain or cramping.  Has felt bloated.   No BRBPR or melana.  Diarrhea as outlined.          Objective:   Physical Exam  Filed Vitals:   06/21/12 1037  BP: 112/80  Pulse: 73  Temp: 98.9 F (76.3 C)   57 year old female in no acute distress.  NECK:  Supple.  Nontender.  HEART:  Appears to be regular. LUNGS:  No crackles or wheezing audible.  Respirations even and unlabored.   ABDOMEN:  Soft, nontender.  Bowel sounds present and normal.  No audible abdominal bruit.  RECTAL:  Heme negative.           Assessment & Plan:  PROBABLE VIRAL GASTROENTERITIS.  No vomiting. Nausea has improved.  Some persistent diarrhea.  No blood.  The black stool probably came from the pepto bismol.  Bland foods.  Advance slowly.  Stay hydrated.   Kaopectate as directed.  Follow.  Check cbc. Notify me if persistent problems.   CARDIOVASCULAR.  Currently asymptomatic.  Continue risk factor modification.  Stress echo 07/29/11-  Normal stress ECHO with no evidence of ischemia.    HEALTH MAINTENANCE.  Last mammogram 12/21/11 - BiRADS II.  Is s/p hysterectomy.  Will notify me when agreeable for bone density.

## 2012-08-04 ENCOUNTER — Encounter: Payer: Self-pay | Admitting: Internal Medicine

## 2012-08-04 ENCOUNTER — Ambulatory Visit (INDEPENDENT_AMBULATORY_CARE_PROVIDER_SITE_OTHER): Payer: 59 | Admitting: Internal Medicine

## 2012-08-04 VITALS — BP 110/70 | HR 69 | Temp 98.8°F | Ht 65.0 in | Wt 175.5 lb

## 2012-08-04 DIAGNOSIS — I10 Essential (primary) hypertension: Secondary | ICD-10-CM

## 2012-08-04 DIAGNOSIS — K760 Fatty (change of) liver, not elsewhere classified: Secondary | ICD-10-CM

## 2012-08-04 DIAGNOSIS — K7689 Other specified diseases of liver: Secondary | ICD-10-CM

## 2012-08-04 DIAGNOSIS — E039 Hypothyroidism, unspecified: Secondary | ICD-10-CM

## 2012-08-04 DIAGNOSIS — E7800 Pure hypercholesterolemia, unspecified: Secondary | ICD-10-CM

## 2012-08-04 DIAGNOSIS — D353 Benign neoplasm of craniopharyngeal duct: Secondary | ICD-10-CM

## 2012-08-04 DIAGNOSIS — D352 Benign neoplasm of pituitary gland: Secondary | ICD-10-CM

## 2012-08-04 NOTE — Assessment & Plan Note (Addendum)
Liver function has been stable.  Follows with GI.  Abdominal ultrasound 12/04/09 - fatty liver.  Follow liver function.  Liver panel just checked 07/20/12 revealed ast 47 and alt 83.

## 2012-08-04 NOTE — Progress Notes (Signed)
Subjective:    Patient ID: Marie Colon, female    DOB: 1955-06-12, 57 y.o.   MRN: [id number]  HPI 57 year old female with past history of hypercholesterolemia, hypertension, hypothyroidism, abnormal liver function and a pituitary macroadenoma with associated acromegaly (elevated growth hormone).  She comes in today for a scheduled follow up.   No increased cough or congestion.  Breathing stable.  Still seeing Dr Tedd Sias.  Due to follow up at the end of august.  Handling stress relatively well.  Overall she feels she is doing well.  Eating and drinking well.  Bowels stable.     Past Medical History  Diagnosis Date  . Hypertension   . Hypercholesterolemia   . Hypothyroidism   . Pituitary macroadenoma     s/p removal (elevated growth hormone)  . GERD (gastroesophageal reflux disease)   . Tubal pregnancy     s/p rupture  . Angiomyolipoma of kidney     evaluated by Dr Lonna Cobb    Current Outpatient Prescriptions on File Prior to Visit  Medication Sig Dispense Refill  . aspirin 81 MG chewable tablet Chew 81 mg by mouth daily.      . cabergoline (DOSTINEX) 0.5 MG tablet Take 0.5 mg by mouth Nightly.       . docusate sodium (COLACE) 100 MG capsule Take 100 mg by mouth daily.      Marland Kitchen estradiol (ESTRACE) 0.5 MG tablet Take 1 tablet (0.5 mg total) by mouth daily.  90 tablet  3  . lansoprazole (PREVACID) 15 MG capsule Take 1 capsule (15 mg total) by mouth daily.  90 capsule  3  . levothyroxine (SYNTHROID, LEVOTHROID) 100 MCG tablet Take 1 tablet (100 mcg total) by mouth daily.  90 tablet  3  . metoprolol (LOPRESSOR) 50 MG tablet Take 1 tablet (50 mg total) by mouth 2 (two) times daily.  60 tablet  5  . Multiple Vitamin (MULTIVITAMIN) tablet Take 1 tablet by mouth daily.      . polyethylene glycol powder (GLYCOLAX/MIRALAX) powder Take with your choice of fluid. This is safe to use on a daily basis if needed.  3350 g  1  . pravastatin (PRAVACHOL) 40 MG tablet Take 1 tablet (40 mg total) by mouth  daily.  90 tablet  3  . ranitidine (ZANTAC) 150 MG capsule Take 150 mg by mouth every evening.      . triamterene-hydrochlorothiazide (DYAZIDE) 37.5-25 MG per capsule Take 1 each (1 capsule total) by mouth daily.  30 capsule  5   No current facility-administered medications on file prior to visit.    Review of Systems Patient denies any headache, lightheadedness or dizziness.  No significant sinus or allergy symptoms currently.   No chest pain, tightness or palpitations.  No increased shortness of breath, cough or congestion.  Still some occasional reflux.  Overall controlled.  No nausea or vomiting.  No abdominal pain or cramping.  No bowel change, such as diarrhea, constipation, BRBPR or melana.  No urine change.        Objective:   Physical Exam  Filed Vitals:   08/04/12 0803  BP: 110/70  Pulse: 69  Temp: 98.8 F (41.27 C)   57 year old female in no acute distress.   HEENT:  Nares- clear.  Oropharynx - without lesions. NECK:  Supple.  Nontender.  No audible bruit.  HEART:  Appears to be regular. LUNGS:  No crackles or wheezing audible.  Respirations even and unlabored.  RADIAL PULSE:  Equal bilaterally.  ABDOMEN:  Soft, nontender.  Bowel sounds present and normal.  No audible abdominal bruit.  EXTREMITIES:  No increased edema present.  DP pulses palpable and equal bilaterally.           Assessment & Plan:  CARDIOVASCULAR.  Currently asymptomatic.  Continue risk factor modification.  Stress echo 07/29/11-  Normal stress ECHO with no evidence of ischemia.    INCREASED PSYCHOSOCIAL STRESSORS.  Doing well.  Follow.   GYN.  Stable.        HEALTH MAINTENANCE.  Physical 11/24/11.  Last mammogram 12/21/11 - BiRADS II.  Is s/p hysterectomy.

## 2012-08-04 NOTE — Assessment & Plan Note (Signed)
Found incidentally on a previous CT/ultrasound.  Last CT (07/28/10) revealed - lower pole right renal angiomyolipoma.  Reviewed by Dr Stoioff.  Rec follow up periodic ultrasound.   

## 2012-08-07 ENCOUNTER — Encounter: Payer: Self-pay | Admitting: Internal Medicine

## 2012-08-07 NOTE — Assessment & Plan Note (Signed)
On Synthroid.  TSH 03/01/12 - wnl.

## 2012-08-07 NOTE — Assessment & Plan Note (Signed)
S/P removal (Dr Dwyane Luo) at [facility].  Elevated growth hormone.  History of acromegaly.  Followed by Dr Tedd Sias.  On Cabergoline.  Doing well.  Last MRI (pituitary) - revealed no evidence of residual or recurrent disease - (09/10/10).  Seeing Dr Tedd Sias.  Stable.

## 2012-08-07 NOTE — Assessment & Plan Note (Signed)
Blood pressure under good control.  Same meds.  Follow metabolic panel.   

## 2012-08-07 NOTE — Assessment & Plan Note (Signed)
On Pravastatin.  Low cholesterol diet and exercise.  Follow lipid panel and liver function.    

## 2012-08-08 ENCOUNTER — Encounter: Payer: Self-pay | Admitting: Adult Health

## 2012-08-08 ENCOUNTER — Ambulatory Visit (INDEPENDENT_AMBULATORY_CARE_PROVIDER_SITE_OTHER): Payer: 59 | Admitting: Adult Health

## 2012-08-08 ENCOUNTER — Other Ambulatory Visit: Payer: Self-pay | Admitting: *Deleted

## 2012-08-08 VITALS — BP 108/68 | HR 69 | Temp 98.2°F | Resp 12 | Wt 177.0 lb

## 2012-08-08 DIAGNOSIS — L989 Disorder of the skin and subcutaneous tissue, unspecified: Secondary | ICD-10-CM

## 2012-08-08 DIAGNOSIS — R238 Other skin changes: Secondary | ICD-10-CM | POA: Insufficient documentation

## 2012-08-08 MED ORDER — METOPROLOL TARTRATE 50 MG PO TABS: 50.0000 mg | ORAL_TABLET | Freq: Two times a day (BID) | ORAL | Status: DC

## 2012-08-08 MED ORDER — TRIAMTERENE-HCTZ 37.5-25 MG PO CAPS: 1.0000 | ORAL_CAPSULE | Freq: Every day | ORAL | Status: DC

## 2012-08-08 NOTE — Progress Notes (Signed)
  Subjective:    Patient ID: Marie Colon, female    DOB: [birth year], 57 y.o.   MRN: [id number]  HPI  Patient is a pleasant 57 year old female who presents to clinic with area on her left breast that is irritated and itchy. She first noticed this a few days ago. She has been scratching the area on the left outer portion of her breast. She denies drainage from her nipple, pain or feeling any lumps.   Current Outpatient Prescriptions on File Prior to Visit  Medication Sig Dispense Refill  . aspirin 81 MG chewable tablet Chew 81 mg by mouth daily.      . cabergoline (DOSTINEX) 0.5 MG tablet Take 0.5 mg by mouth Nightly.       . docusate sodium (COLACE) 100 MG capsule Take 100 mg by mouth daily.      Marland Kitchen estradiol (ESTRACE) 0.5 MG tablet Take 1 tablet (0.5 mg total) by mouth daily.  90 tablet  3  . lansoprazole (PREVACID) 15 MG capsule Take 1 capsule (15 mg total) by mouth daily.  90 capsule  3  . levothyroxine (SYNTHROID, LEVOTHROID) 100 MCG tablet Take 1 tablet (100 mcg total) by mouth daily.  90 tablet  3  . metoprolol (LOPRESSOR) 50 MG tablet Take 1 tablet (50 mg total) by mouth 2 (two) times daily.  60 tablet  5  . Multiple Vitamin (MULTIVITAMIN) tablet Take 1 tablet by mouth daily.      . polyethylene glycol powder (GLYCOLAX/MIRALAX) powder Take with your choice of fluid. This is safe to use on a daily basis if needed.  3350 g  1  . pravastatin (PRAVACHOL) 40 MG tablet Take 1 tablet (40 mg total) by mouth daily.  90 tablet  3  . ranitidine (ZANTAC) 150 MG capsule Take 150 mg by mouth every evening.      . triamterene-hydrochlorothiazide (DYAZIDE) 37.5-25 MG per capsule Take 1 each (1 capsule total) by mouth daily.  30 capsule  5   No current facility-administered medications on file prior to visit.    Review of Systems  Skin:       Irritation on left breast. Itching.   BP 108/68  Pulse 69  Temp(Src) 98.2 F (36.8 C) (Oral)  Resp 12  Wt 177 lb (80.287 kg)  BMI 29.45 kg/m2  SpO2  97%    Objective:   Physical Exam  Genitourinary: No breast swelling, tenderness, discharge or bleeding.  There is not breast dimpling. The left breast is larger than the right. There are no palpable nodules. Skin: on left outer breast with slight erythema, micro ecchymosis;  no swelling.       Assessment & Plan:

## 2012-08-08 NOTE — Patient Instructions (Addendum)
  You have a benign skin irritation on your left breast.   There was no lumps noted.  Apply benadryl cream or hydrocortisone cream to the affected area twice daily.  If there is no improvement please call the office.

## 2012-08-08 NOTE — Assessment & Plan Note (Signed)
Dime size skin irritation on the left outer breast. Patient has been scratching the area and has formed micro-ecchymotic area from broken capillaries. There is no induration of the skin, no palpable lesions, nodules. Apply benadryl for itch or hydrocortisone cream to the affected area twice daily. Please call if no improvement within 1 week.

## 2012-08-18 ENCOUNTER — Encounter: Payer: Self-pay | Admitting: Internal Medicine

## 2012-09-21 ENCOUNTER — Ambulatory Visit: Payer: Self-pay | Admitting: Gastroenterology

## 2012-09-22 LAB — PATHOLOGY REPORT

## 2012-10-28 ENCOUNTER — Encounter: Payer: Self-pay | Admitting: Adult Health

## 2012-10-28 ENCOUNTER — Ambulatory Visit (INDEPENDENT_AMBULATORY_CARE_PROVIDER_SITE_OTHER): Payer: 59 | Admitting: Adult Health

## 2012-10-28 VITALS — BP 100/68 | HR 64 | Temp 98.5°F | Resp 12 | Wt 175.0 lb

## 2012-10-28 DIAGNOSIS — J329 Chronic sinusitis, unspecified: Secondary | ICD-10-CM

## 2012-10-28 MED ORDER — GUAIFENESIN-CODEINE 100-10 MG/5ML PO SOLN: 5.0000 mL | Freq: Three times a day (TID) | ORAL | Status: DC | PRN

## 2012-10-28 MED ORDER — FLUTICASONE PROPIONATE 50 MCG/ACT NA SUSP: 2.0000 | Freq: Every day | NASAL | Status: DC

## 2012-10-28 MED ORDER — AZITHROMYCIN 250 MG PO TABS: ORAL_TABLET | ORAL | Status: DC

## 2012-10-28 NOTE — Assessment & Plan Note (Signed)
Start azithromycin and Flonase. Saline spray to irrigate sinuses. May continue to use Mucinex and over-the-counter Robitussin for cough. Robitussin a.c. prescribed for severe cough. Return to clinic if no improvement by Tuesday.

## 2012-10-28 NOTE — Patient Instructions (Addendum)
Start Azithromycin today as directed.  Flonase nasal spray 2 sprays into each nostril daily  Continue saline spray  Robitussin AC for severe cough. Can cause sedation. Do not drive, drink alcohol while taking   Sinusitis is a condition that can cause a stuffy nose, pain in the face, and yellow or green discharge (mucus) from the nose. The sinuses are hollow areas in the bones of the face. They have a thin lining that normally makes a small amount of mucus. When this lining gets infected, it swells and makes extra mucus. This causes symptoms.   Sinusitis can occur when a person gets sick with a cold. The germs causing the cold can also infect the sinuses. Many times, a person feels like his or her cold is getting better. But then he or she gets sinusitis and begins to feel sick again.  What are the symptoms of sinusitis? - Common symptoms of sinusitis include:  Stuffy or blocked nose  Thick yellow or green discharge from the nose  Pain in the teeth  Pain or pressure in the face - This often feels worse when a person bends forward.   People with sinusitis can also have other symptoms that include:  Fever  Cough  Trouble smelling  Ear pressure or fullness  Headache  Bad breath  Feeling tired   Most of the time, symptoms start to improve in 7 to 10 days.  See your doctor or nurse if your symptoms last more than 7 days, or if your symptoms get better at first but then get worse.  Sometimes, sinusitis can lead to serious problems. See your doctor or nurse right away (do not wait 7 days) if you have:  Fever higher than 102.59F (39.2C)  Sudden and severe pain in the face and head  Trouble seeing or seeing double  Trouble thinking clearly  Swelling or redness around 1 or both eyes  Trouble breathing or a stiff neck   Is there anything I can do on my own to feel better? - Yes. To reduce your symptoms, you can: Take an over-the-counter pain reliever to reduce the pain  Rinse  your nose and sinuses with salt water a few times a day - Ask your doctor or nurse about the best way to do this.  Use a decongestant nose spray - These sprays are sold in a pharmacy. But do not use decongestant nose sprays for more than 2 to 3 days in a row. Using them more than 3 days in a row can make symptoms worse.   You should NOT take an antihistamine for sinusitis. Common antihistamines include diphenhydramine (sample brand name: Benadryl), chlorpheniramine (sample brand name: Chlor-Trimeton), loratadine (sample brand name: Claritin), and cetirizine (sample brand name: Zyrtec). They can treat allergies, but not sinus infections, and could increase your discomfort by drying the lining of your nose and sinuses, or making you tired.   Your doctor might also prescribe a steroid nose spray to reduce the swelling in your nose. (Steroid nose sprays do not contain the same steroids that athletes take to build muscle.)  How is sinusitis treated? - Most of the time, sinusitis does not need to be treated with antibiotic medicines. This is because most sinusitis is caused by viruses - not bacteria - and antibiotics do not kill viruses. Many people get over sinus infections without antibiotics.  Some people with sinusitis do need treatment with antibiotics. If your symptoms have not improved after 7 to 10 days, ask your  doctor if you should take antibiotics. Your doctor might recommend that you wait 1 more week to see if your symptoms improve. But if you have symptoms such as a fever or a lot of pain, he or she might prescribe antibiotics. It is important to follow your doctor's instructions about taking your antibiotics.

## 2012-10-28 NOTE — Progress Notes (Signed)
  Subjective:    Patient ID: Marie Colon, female    DOB: 05-12-1955, 57 y.o.   MRN: [id number]  HPI  Pt is pleasant 57 yo female, presents to clinic for c/o cough, nasal congestion and bilateral ear pressure since yesterday.  Pt reports "crackling" in ears when she coughs.  Pt denies SOB, fever, or chills. Pt states she took 1 Mucinex-D yesterday with no relief. Patient's daughter is getting married a week from tomorrow. She wants to make sure that she is well for the wedding.   Current Outpatient Prescriptions on File Prior to Visit  Medication Sig Dispense Refill  . aspirin 81 MG chewable tablet Chew 81 mg by mouth daily.      . cabergoline (DOSTINEX) 0.5 MG tablet Take 0.5 mg by mouth Nightly.       . docusate sodium (COLACE) 100 MG capsule Take 100 mg by mouth daily.      Marland Kitchen estradiol (ESTRACE) 0.5 MG tablet Take 1 tablet (0.5 mg total) by mouth daily.  90 tablet  3  . lansoprazole (PREVACID) 15 MG capsule Take 1 capsule (15 mg total) by mouth daily.  90 capsule  3  . levothyroxine (SYNTHROID, LEVOTHROID) 100 MCG tablet Take 1 tablet (100 mcg total) by mouth daily.  90 tablet  3  . metoprolol (LOPRESSOR) 50 MG tablet Take 1 tablet (50 mg total) by mouth 2 (two) times daily.  60 tablet  5  . Multiple Vitamin (MULTIVITAMIN) tablet Take 1 tablet by mouth daily.      . polyethylene glycol powder (GLYCOLAX/MIRALAX) powder Take with your choice of fluid. This is safe to use on a daily basis if needed.  3350 g  1  . pravastatin (PRAVACHOL) 40 MG tablet Take 1 tablet (40 mg total) by mouth daily.  90 tablet  3  . ranitidine (ZANTAC) 150 MG capsule Take 150 mg by mouth every evening.      . triamterene-hydrochlorothiazide (DYAZIDE) 37.5-25 MG per capsule Take 1 each (1 capsule total) by mouth daily.  30 capsule  5   No current facility-administered medications on file prior to visit.     Review of Systems  Constitutional: Negative.  Negative for fever and chills.  HENT: Positive for ear  pain, congestion and postnasal drip.   Respiratory: Positive for cough. Negative for shortness of breath and wheezing.   Cardiovascular: Negative.  Negative for chest pain.  Skin: Negative.   Psychiatric/Behavioral: Negative.        Objective:   Physical Exam  Constitutional: She is oriented to person, place, and time. She appears well-developed and well-nourished.  HENT:  Mouth/Throat: Oropharynx is clear and moist and mucous membranes are normal.  Post oropharyngeal drainage  Cardiovascular: Normal rate, regular rhythm and normal heart sounds.   Pulmonary/Chest: Breath sounds normal. She has no wheezes.  Neurological: She is alert and oriented to person, place, and time.  Skin: Skin is warm and dry.  Psychiatric: She has a normal mood and affect. Her behavior is normal. Judgment and thought content normal.    BP 100/68  Pulse 64  Temp(Src) 98.5 F (36.9 C) (Oral)  Resp 12  Wt 175 lb (79.379 kg)  BMI 29.12 kg/m2  SpO2 97%       Assessment & Plan:

## 2012-11-15 ENCOUNTER — Telehealth: Payer: Self-pay | Admitting: Internal Medicine

## 2012-11-15 NOTE — Telephone Encounter (Signed)
Pt notified liver panel stable via my chart.

## 2012-11-17 NOTE — Telephone Encounter (Signed)
Mailed unread message to pt  

## 2012-11-28 ENCOUNTER — Encounter: Payer: Self-pay | Admitting: Internal Medicine

## 2012-11-28 DIAGNOSIS — K2970 Gastritis, unspecified, without bleeding: Secondary | ICD-10-CM | POA: Insufficient documentation

## 2012-11-28 DIAGNOSIS — Z8371 Family history of colonic polyps: Secondary | ICD-10-CM | POA: Insufficient documentation

## 2012-12-01 ENCOUNTER — Encounter: Payer: Self-pay | Admitting: Internal Medicine

## 2012-12-03 NOTE — Telephone Encounter (Signed)
Responded to pt. See other mychart message.

## 2012-12-08 ENCOUNTER — Ambulatory Visit (INDEPENDENT_AMBULATORY_CARE_PROVIDER_SITE_OTHER): Payer: 59 | Admitting: Internal Medicine

## 2012-12-08 ENCOUNTER — Encounter: Payer: Self-pay | Admitting: Internal Medicine

## 2012-12-08 VITALS — BP 110/70 | HR 62 | Temp 98.7°F | Ht 63.0 in | Wt 182.0 lb

## 2012-12-08 DIAGNOSIS — Z803 Family history of malignant neoplasm of breast: Secondary | ICD-10-CM

## 2012-12-08 DIAGNOSIS — D352 Benign neoplasm of pituitary gland: Secondary | ICD-10-CM

## 2012-12-08 DIAGNOSIS — D3000 Benign neoplasm of unspecified kidney: Secondary | ICD-10-CM

## 2012-12-08 DIAGNOSIS — Z8371 Family history of colonic polyps: Secondary | ICD-10-CM

## 2012-12-08 DIAGNOSIS — I10 Essential (primary) hypertension: Secondary | ICD-10-CM

## 2012-12-08 DIAGNOSIS — K760 Fatty (change of) liver, not elsewhere classified: Secondary | ICD-10-CM

## 2012-12-08 DIAGNOSIS — K2970 Gastritis, unspecified, without bleeding: Secondary | ICD-10-CM

## 2012-12-08 DIAGNOSIS — K7689 Other specified diseases of liver: Secondary | ICD-10-CM

## 2012-12-08 DIAGNOSIS — Z1239 Encounter for other screening for malignant neoplasm of breast: Secondary | ICD-10-CM

## 2012-12-08 DIAGNOSIS — E039 Hypothyroidism, unspecified: Secondary | ICD-10-CM

## 2012-12-08 DIAGNOSIS — E7800 Pure hypercholesterolemia, unspecified: Secondary | ICD-10-CM

## 2012-12-08 NOTE — Progress Notes (Signed)
Subjective:    Patient ID: Marie Colon, female    DOB: 17-Feb-1955, 57 y.o.   MRN: [id number]  HPI 57 year old female with past history of hypercholesterolemia, hypertension, hypothyroidism, abnormal liver function and a pituitary macroadenoma with associated acromegaly (elevated growth hormone).  She comes in today to follow up on these issues as well as for a complete physical exam.    No increased cough or congestion.  Breathing stable.  Still seeing Dr Tedd Sias.  Handling stress relatively well.  Overall she feels she is doing well.  Eating and drinking well.  Bowels stable.  Just saw GI.  Instructed to take prevacid 30mg  q day.  She has not started this yet.  Plans to start.  Still has some increased gas.  Discussed taking a probiotic.  Liver panel stable.     Past Medical History  Diagnosis Date  . Hypertension   . Hypercholesterolemia   . Hypothyroidism   . Pituitary macroadenoma     s/p removal (elevated growth hormone)  . GERD (gastroesophageal reflux disease)   . Tubal pregnancy     s/p rupture  . Angiomyolipoma of kidney     evaluated by Dr Lonna Cobb    Current Outpatient Prescriptions on File Prior to Visit  Medication Sig Dispense Refill  . aspirin 81 MG chewable tablet Chew 81 mg by mouth daily.      . cabergoline (DOSTINEX) 0.5 MG tablet Take 0.5 mg by mouth Nightly.       . docusate sodium (COLACE) 100 MG capsule Take 100 mg by mouth daily.      Marland Kitchen estradiol (ESTRACE) 0.5 MG tablet Take 1 tablet (0.5 mg total) by mouth daily.  90 tablet  3  . lansoprazole (PREVACID) 15 MG capsule Take 1 capsule (15 mg total) by mouth daily.  90 capsule  3  . levothyroxine (SYNTHROID, LEVOTHROID) 100 MCG tablet Take 1 tablet (100 mcg total) by mouth daily.  90 tablet  3  . metoprolol (LOPRESSOR) 50 MG tablet Take 1 tablet (50 mg total) by mouth 2 (two) times daily.  60 tablet  5  . Multiple Vitamin (MULTIVITAMIN) tablet Take 1 tablet by mouth daily.      . polyethylene glycol powder  (GLYCOLAX/MIRALAX) powder Take with your choice of fluid. This is safe to use on a daily basis if needed.  3350 g  1  . pravastatin (PRAVACHOL) 40 MG tablet Take 1 tablet (40 mg total) by mouth daily.  90 tablet  3  . triamterene-hydrochlorothiazide (DYAZIDE) 37.5-25 MG per capsule Take 1 each (1 capsule total) by mouth daily.  30 capsule  5   No current facility-administered medications on file prior to visit.    Review of Systems Patient denies any headache, lightheadedness or dizziness.  No significant sinus or allergy symptoms currently.   No chest pain, tightness or palpitations.  No increased shortness of breath, cough or congestion.  Still some occasional reflux.  Overall controlled.  No nausea or vomiting.  No abdominal pain or cramping.  Some increased gas.  No bowel change, such as diarrhea, constipation, BRBPR or melana.  No urine change.         Objective:   Physical Exam  Filed Vitals:   12/08/12 0832  BP: 110/70  Pulse: 62  Temp: 98.7 F (71.62 C)   57 year old female in no acute distress.   HEENT:  Nares- clear.  Oropharynx - without lesions. NECK:  Supple.  Nontender.  No audible bruit.  HEART:  Appears to be regular. LUNGS:  No crackles or wheezing audible.  Respirations even and unlabored.  RADIAL PULSE:  Equal bilaterally.    BREASTS:  No nipple discharge or nipple retraction present.  Could not appreciate any distinct nodules or axillary adenopathy.  ABDOMEN:  Soft, nontender.  Bowel sounds present and normal.  No audible abdominal bruit.  GU:  Normal external genitalia.  Vaginal vault without lesions.  S/p hysterectomy.   Could not appreciate any adnexal masses or tenderness.   RECTAL:  Heme negative.   EXTREMITIES:  No increased edema present.  DP pulses palpable and equal bilaterally.          Assessment & Plan:  CARDIOVASCULAR.  Currently asymptomatic.  Continue risk factor modification.  Stress echo 07/29/11-  Normal stress ECHO with no evidence of ischemia.     INCREASED PSYCHOSOCIAL STRESSORS.  Doing well.  Follow.   GYN.  Stable.        HEALTH MAINTENANCE.  Physical today.  Last mammogram 12/21/11 - BiRADS II.  Schedule a follow up mammogram.  Will see if we can schedule a diagnostic mammogram.  Is s/p hysterectomy.

## 2012-12-08 NOTE — Progress Notes (Signed)
Pre-visit discussion using our clinic review tool. No additional management support is needed unless otherwise documented below in the visit note.  

## 2012-12-11 ENCOUNTER — Encounter: Payer: Self-pay | Admitting: Internal Medicine

## 2012-12-11 DIAGNOSIS — Z803 Family history of malignant neoplasm of breast: Secondary | ICD-10-CM | POA: Insufficient documentation

## 2012-12-11 NOTE — Assessment & Plan Note (Signed)
Found incidentally on a previous CT/ultrasound.  Last CT (07/28/10) revealed - lower pole right renal angiomyolipoma.  Reviewed by Dr Stoioff.  Rec follow up periodic ultrasound.   

## 2012-12-11 NOTE — Assessment & Plan Note (Signed)
On Synthroid.  TSH 03/01/12 - wnl.  Follow.

## 2012-12-11 NOTE — Assessment & Plan Note (Signed)
EGD as outlined.  Just saw GI.  Instructed to take prevacid 30mg  q day.  Was only on 15mg .  Also can try align.  See if this helps with gas.  Follow.

## 2012-12-11 NOTE — Assessment & Plan Note (Signed)
S/P removal (Dr Dwyane Luo) at [facility].  Elevated growth hormone.  History of acromegaly.  Followed by Dr Tedd Sias.  On Cabergoline.  Doing well.  Last MRI (pituitary) - revealed no evidence of residual or recurrent disease - (09/10/10).  Seeing Dr Tedd Sias.  Stable.

## 2012-12-11 NOTE — Assessment & Plan Note (Signed)
Colonoscopy as outlined.  F/u colonoscopy in five years.   

## 2012-12-11 NOTE — Assessment & Plan Note (Signed)
Blood pressure under good control.  Same meds.  Follow metabolic panel.   

## 2012-12-11 NOTE — Assessment & Plan Note (Signed)
On Pravastatin.  Low cholesterol diet and exercise.  Follow lipid panel and liver function.    

## 2012-12-11 NOTE — Assessment & Plan Note (Signed)
Two sisters recently diagnosed with breast cancer.  Will see if we can schedule a diagnostic mammogram.

## 2012-12-11 NOTE — Assessment & Plan Note (Signed)
Liver function has been stable.  Follows with GI.  Abdominal ultrasound 12/04/09 - fatty liver.  Follow liver function.  Liver panel just checked revealed ast 43 and alt 62.  Stable.  Follow.  Per GI, she will let them know when she is able to have abdominal ultrasound.

## 2012-12-12 ENCOUNTER — Encounter: Payer: Self-pay | Admitting: Internal Medicine

## 2012-12-28 ENCOUNTER — Ambulatory Visit: Payer: Self-pay | Admitting: Internal Medicine

## 2012-12-28 LAB — HM MAMMOGRAPHY

## 2012-12-29 ENCOUNTER — Encounter: Payer: Self-pay | Admitting: Internal Medicine

## 2013-01-09 ENCOUNTER — Ambulatory Visit: Payer: Self-pay | Admitting: Internal Medicine

## 2013-01-09 LAB — HM MAMMOGRAPHY: HM Mammogram: NEGATIVE

## 2013-01-11 ENCOUNTER — Encounter: Payer: Self-pay | Admitting: Internal Medicine

## 2013-02-06 ENCOUNTER — Other Ambulatory Visit: Payer: Self-pay | Admitting: Internal Medicine

## 2013-03-09 ENCOUNTER — Other Ambulatory Visit: Payer: Self-pay | Admitting: Internal Medicine

## 2013-03-10 LAB — LIPID PANEL W/O CHOL/HDL RATIO
Cholesterol, Total: 206 mg/dL — ABNORMAL HIGH (ref 100–199)
HDL: 42 mg/dL (ref >39)
LDL Calculated: 123 mg/dL — ABNORMAL HIGH (ref 0–99)
TRIGLYCERIDES: 204 mg/dL — AB (ref 0–149)
VLDL Cholesterol Cal: 41 mg/dL — ABNORMAL HIGH (ref 5–40)

## 2013-03-10 LAB — CBC WITH DIFFERENTIAL
BASOS: 0 %
Basophils Absolute: 0 10*3/uL (ref 0.0–0.2)
EOS: 2 %
Eosinophils Absolute: 0.1 10*3/uL (ref 0.0–0.4)
HEMATOCRIT: 41.1 % (ref 34.0–46.6)
Hemoglobin: 14.3 g/dL (ref 11.1–15.9)
Immature Grans (Abs): 0 10*3/uL (ref 0.0–0.1)
Immature Granulocytes: 0 %
LYMPHS ABS: 1.6 10*3/uL (ref 0.7–3.1)
Lymphs: 29 %
MCH: 30.7 pg (ref 26.6–33.0)
MCHC: 34.8 g/dL (ref 31.5–35.7)
MCV: 88 fL (ref 79–97)
Monocytes Absolute: 0.5 10*3/uL (ref 0.1–0.9)
Monocytes: 8 %
Neutrophils Absolute: 3.4 10*3/uL (ref 1.4–7.0)
Neutrophils Relative %: 61 %
PLATELETS: 210 10*3/uL (ref 150–379)
RBC: 4.66 x10E6/uL (ref 3.77–5.28)
RDW: 14 % (ref 12.3–15.4)
WBC: 5.6 10*3/uL (ref 3.4–10.8)

## 2013-03-10 LAB — BASIC METABOLIC PANEL
BUN / CREAT RATIO: 16 (ref 9–23)
BUN: 19 mg/dL (ref 6–24)
CO2: 25 mmol/L (ref 18–29)
CREATININE: 1.21 mg/dL — AB (ref 0.57–1.00)
Calcium: 9.9 mg/dL (ref 8.7–10.2)
Chloride: 100 mmol/L (ref 97–108)
GFR calc Af Amer: 57 mL/min/{1.73_m2} — ABNORMAL LOW (ref >59)
GFR, EST NON AFRICAN AMERICAN: 50 mL/min/{1.73_m2} — AB (ref >59)
Glucose: 97 mg/dL (ref 65–99)
Potassium: 3.7 mmol/L (ref 3.5–5.2)
Sodium: 143 mmol/L (ref 134–144)

## 2013-03-14 ENCOUNTER — Encounter: Payer: Self-pay | Admitting: Internal Medicine

## 2013-03-16 NOTE — Telephone Encounter (Signed)
Mailed unread MyChart message

## 2013-03-27 ENCOUNTER — Other Ambulatory Visit: Payer: Self-pay | Admitting: Internal Medicine

## 2013-03-27 ENCOUNTER — Encounter: Payer: Self-pay | Admitting: Internal Medicine

## 2013-03-27 MED ORDER — METOPROLOL TARTRATE 50 MG PO TABS: ORAL_TABLET | ORAL | Status: DC

## 2013-03-27 MED ORDER — TRIAMTERENE-HCTZ 37.5-25 MG PO CAPS: ORAL_CAPSULE | ORAL | Status: DC

## 2013-03-27 MED ORDER — LEVOTHYROXINE SODIUM 100 MCG PO TABS: 100.0000 ug | ORAL_TABLET | Freq: Every day | ORAL | Status: DC

## 2013-04-04 ENCOUNTER — Ambulatory Visit (INDEPENDENT_AMBULATORY_CARE_PROVIDER_SITE_OTHER): Payer: 59 | Admitting: Internal Medicine

## 2013-04-04 ENCOUNTER — Encounter: Payer: Self-pay | Admitting: Internal Medicine

## 2013-04-04 VITALS — BP 136/82 | HR 70 | Temp 98.3°F | Ht 63.0 in | Wt 181.0 lb

## 2013-04-04 DIAGNOSIS — D3000 Benign neoplasm of unspecified kidney: Secondary | ICD-10-CM

## 2013-04-04 DIAGNOSIS — K7689 Other specified diseases of liver: Secondary | ICD-10-CM

## 2013-04-04 DIAGNOSIS — I10 Essential (primary) hypertension: Secondary | ICD-10-CM

## 2013-04-04 DIAGNOSIS — Z8371 Family history of colonic polyps: Secondary | ICD-10-CM

## 2013-04-04 DIAGNOSIS — D1771 Benign lipomatous neoplasm of kidney: Secondary | ICD-10-CM

## 2013-04-04 DIAGNOSIS — D353 Benign neoplasm of craniopharyngeal duct: Secondary | ICD-10-CM

## 2013-04-04 DIAGNOSIS — Z83719 Family history of colon polyps, unspecified: Secondary | ICD-10-CM

## 2013-04-04 DIAGNOSIS — E039 Hypothyroidism, unspecified: Secondary | ICD-10-CM

## 2013-04-04 DIAGNOSIS — E7800 Pure hypercholesterolemia, unspecified: Secondary | ICD-10-CM

## 2013-04-04 DIAGNOSIS — D352 Benign neoplasm of pituitary gland: Secondary | ICD-10-CM

## 2013-04-04 DIAGNOSIS — K2990 Gastroduodenitis, unspecified, without bleeding: Secondary | ICD-10-CM

## 2013-04-04 DIAGNOSIS — N289 Disorder of kidney and ureter, unspecified: Secondary | ICD-10-CM

## 2013-04-04 DIAGNOSIS — K2970 Gastritis, unspecified, without bleeding: Secondary | ICD-10-CM

## 2013-04-04 DIAGNOSIS — K760 Fatty (change of) liver, not elsewhere classified: Secondary | ICD-10-CM

## 2013-04-04 DIAGNOSIS — Z803 Family history of malignant neoplasm of breast: Secondary | ICD-10-CM

## 2013-04-04 NOTE — Progress Notes (Signed)
Pre-visit discussion using our clinic review tool. No additional management support is needed unless otherwise documented below in the visit note.  

## 2013-04-04 NOTE — Progress Notes (Signed)
Subjective:    Patient ID: Marie Colon, female    DOB: 06-Jan-1956, 58 y.o.   MRN: [id number]  HPI 58 year old female with past history of hypercholesterolemia, hypertension, hypothyroidism, abnormal liver function and a pituitary macroadenoma with associated acromegaly (elevated growth hormone).  She comes in today for a scheduled follow up.  No increased cough or congestion.  Breathing stable.  Still seeing Dr Gabriel Carina.  Handling stress relatively well.  Overall she feels she is doing well.  Eating and drinking well.  Bowels stable.  Taking a probiotic.  Following with GI.  Taking prevacid 30mg  q day.     Past Medical History  Diagnosis Date  . Hypertension   . Hypercholesterolemia   . Hypothyroidism   . Pituitary macroadenoma     s/p removal (elevated growth hormone)  . GERD (gastroesophageal reflux disease)   . Tubal pregnancy     s/p rupture  . Angiomyolipoma of kidney     evaluated by Dr Bernardo Heater    Current Outpatient Prescriptions on File Prior to Visit  Medication Sig Dispense Refill  . aspirin 81 MG chewable tablet Chew 81 mg by mouth daily.      . cabergoline (DOSTINEX) 0.5 MG tablet Take 0.5 mg by mouth Nightly.       . docusate sodium (COLACE) 100 MG capsule Take 100 mg by mouth daily.      Marland Kitchen levothyroxine (SYNTHROID, LEVOTHROID) 100 MCG tablet Take 1 tablet (100 mcg total) by mouth daily.  90 tablet  1  . metoprolol (LOPRESSOR) 50 MG tablet TAKE ONE TABLET BY MOUTH 2 TIMES A DAY  180 tablet  1  . Multiple Vitamin (MULTIVITAMIN) tablet Take 1 tablet by mouth daily.      . polyethylene glycol powder (GLYCOLAX/MIRALAX) powder Take with your choice of fluid. This is safe to use on a daily basis if needed.  3350 g  1  . pravastatin (PRAVACHOL) 40 MG tablet TAKE ONE TABLET BY MOUTH ONCE DAILY  90 tablet  1  . triamterene-hydrochlorothiazide (DYAZIDE) 37.5-25 MG per capsule TAKE ONE CAPSULE BY MOUTH DAILY  90 capsule  1   No current facility-administered medications on file  prior to visit.    Review of Systems Patient denies any headache, lightheadedness or dizziness.  No significant sinus or allergy symptoms currently.   No chest pain, tightness or palpitations.  No increased shortness of breath, cough or congestion.  No acid reflux reported.   No nausea or vomiting.  No abdominal pain or cramping.   No bowel change, such as diarrhea, constipation, BRBPR or melana.  Bowels better on probiotic.   No urine change.         Objective:   Physical Exam  Filed Vitals:   04/04/13 0800  BP: 136/82  Pulse: 70  Temp: 98.3 F (36.8 C)   Blood pressure recheck:  50/33  58 year old female in no acute distress.   HEENT:  Nares- clear.  Oropharynx - without lesions. NECK:  Supple.  Nontender.  No audible bruit.  HEART:  Appears to be regular. LUNGS:  No crackles or wheezing audible.  Respirations even and unlabored.  RADIAL PULSE:  Equal bilaterally.   ABDOMEN:  Soft, nontender.  Bowel sounds present and normal.  No audible abdominal bruit.    EXTREMITIES:  No increased edema present.  DP pulses palpable and equal bilaterally.          Assessment & Plan:  CARDIOVASCULAR.  Currently asymptomatic.  Continue risk  factor modification.  Stress echo 07/29/11-  Normal stress ECHO with no evidence of ischemia.    INCREASED PSYCHOSOCIAL STRESSORS.  Doing well.  Follow.   GYN.  Stable.        HEALTH MAINTENANCE.  Physical 12/08/12.  Last mammogram 01/09/13 - BiRADS I.   Is s/p hysterectomy.

## 2013-04-05 ENCOUNTER — Encounter: Payer: Self-pay | Admitting: Internal Medicine

## 2013-04-05 DIAGNOSIS — N289 Disorder of kidney and ureter, unspecified: Secondary | ICD-10-CM | POA: Insufficient documentation

## 2013-04-05 NOTE — Assessment & Plan Note (Signed)
S/P removal (Dr Jorene Minors) at [facility].  Elevated growth hormone.  History of acromegaly.  Followed by Dr Gabriel Carina.  On Cabergoline.  Doing well.  Last MRI (pituitary) - revealed no evidence of residual or recurrent disease - (09/10/10).  Seeing Dr Gabriel Carina.  Stable.  Discussed f/u with Dr Tommi Rumps.  She will check with her insurance.

## 2013-04-05 NOTE — Assessment & Plan Note (Signed)
EGD as outlined.  Followed by GI.  On prevacid 30mg  q day.  No acid reflux problems reported today.  Follow.

## 2013-04-05 NOTE — Assessment & Plan Note (Signed)
Liver function has been stable.  Follows with GI.  Abdominal ultrasound 12/04/09 - fatty liver.  Follow liver function  Has been stable.

## 2013-04-05 NOTE — Assessment & Plan Note (Signed)
Blood pressure under good control.  Same meds.  Follow metabolic panel.   

## 2013-04-05 NOTE — Assessment & Plan Note (Signed)
Mammogram 01/09/13 - Birads I.    

## 2013-04-05 NOTE — Assessment & Plan Note (Signed)
Colonoscopy as outlined.  F/u colonoscopy in five years.

## 2013-04-05 NOTE — Assessment & Plan Note (Signed)
On Pravastatin.  Low cholesterol diet and exercise.  Follow lipid panel and liver function.    

## 2013-04-05 NOTE — Assessment & Plan Note (Signed)
Found incidentally on a previous CT/ultrasound.  Last CT (07/28/10) revealed - lower pole right renal angiomyolipoma.  Reviewed by Dr Stoioff.  Rec follow up periodic ultrasound.   

## 2013-04-05 NOTE — Assessment & Plan Note (Signed)
Cr recently checked and was slightly elevated.  Was instructed to stay hydrated.  Recent Cr 1.15.  Stable.  Follow.

## 2013-04-05 NOTE — Assessment & Plan Note (Signed)
On Synthroid.  Follow TSH.   

## 2013-04-20 ENCOUNTER — Encounter: Payer: Self-pay | Admitting: Internal Medicine

## 2013-04-24 ENCOUNTER — Encounter: Payer: Self-pay | Admitting: Internal Medicine

## 2013-08-07 ENCOUNTER — Other Ambulatory Visit: Payer: Self-pay | Admitting: Internal Medicine

## 2013-08-30 LAB — HEPATIC FUNCTION PANEL
ALT: 92 U/L — AB (ref 7–35)
AST: 58 U/L — AB (ref 13–35)
Alkaline Phosphatase: 84 U/L (ref 25–125)
BILIRUBIN DIRECT: 0.12 mg/dL (ref 0.01–0.4)
BILIRUBIN, TOTAL: 0.5 mg/dL

## 2013-08-30 LAB — LIPID PANEL
CHOLESTEROL: 202 mg/dL — AB (ref 0–200)
HDL: 40 mg/dL (ref 35–70)
LDL Cholesterol: 114 mg/dL
Triglycerides: 240 mg/dL — AB (ref 40–160)

## 2013-08-30 LAB — BASIC METABOLIC PANEL
BUN: 16 mg/dL (ref 4–21)
CREATININE: 1.2 mg/dL — AB (ref 0.5–1.1)
Glucose: 90 mg/dL
POTASSIUM: 3.8 mmol/L (ref 3.4–5.3)
Sodium: 142 mmol/L (ref 137–147)

## 2013-09-01 ENCOUNTER — Encounter: Payer: Self-pay | Admitting: Internal Medicine

## 2013-09-01 ENCOUNTER — Telehealth: Payer: Self-pay | Admitting: Internal Medicine

## 2013-09-01 NOTE — Telephone Encounter (Signed)
Reviewed pts [employer] labs.  My chart message sent with results.  Low carb diet and exercise.  Has been w/up for liver.

## 2013-09-04 NOTE — Telephone Encounter (Signed)
Unread mychart message mailed to patient 

## 2013-09-11 ENCOUNTER — Ambulatory Visit (INDEPENDENT_AMBULATORY_CARE_PROVIDER_SITE_OTHER): Payer: 59 | Admitting: Internal Medicine

## 2013-09-11 ENCOUNTER — Encounter: Payer: Self-pay | Admitting: Internal Medicine

## 2013-09-11 VITALS — BP 110/80 | HR 64 | Temp 98.5°F | Ht 63.0 in | Wt 177.2 lb

## 2013-09-11 DIAGNOSIS — I10 Essential (primary) hypertension: Secondary | ICD-10-CM

## 2013-09-11 DIAGNOSIS — D353 Benign neoplasm of craniopharyngeal duct: Secondary | ICD-10-CM

## 2013-09-11 DIAGNOSIS — K760 Fatty (change of) liver, not elsewhere classified: Secondary | ICD-10-CM

## 2013-09-11 DIAGNOSIS — Z803 Family history of malignant neoplasm of breast: Secondary | ICD-10-CM

## 2013-09-11 DIAGNOSIS — E7800 Pure hypercholesterolemia, unspecified: Secondary | ICD-10-CM

## 2013-09-11 DIAGNOSIS — D3000 Benign neoplasm of unspecified kidney: Secondary | ICD-10-CM

## 2013-09-11 DIAGNOSIS — N289 Disorder of kidney and ureter, unspecified: Secondary | ICD-10-CM

## 2013-09-11 DIAGNOSIS — E039 Hypothyroidism, unspecified: Secondary | ICD-10-CM

## 2013-09-11 DIAGNOSIS — Z83719 Family history of colon polyps, unspecified: Secondary | ICD-10-CM

## 2013-09-11 DIAGNOSIS — D352 Benign neoplasm of pituitary gland: Secondary | ICD-10-CM

## 2013-09-11 DIAGNOSIS — Z8371 Family history of colonic polyps: Secondary | ICD-10-CM

## 2013-09-11 DIAGNOSIS — K7689 Other specified diseases of liver: Secondary | ICD-10-CM

## 2013-09-11 MED ORDER — METOPROLOL TARTRATE 50 MG PO TABS: ORAL_TABLET | ORAL | Status: DC

## 2013-09-11 MED ORDER — LANSOPRAZOLE 30 MG PO CPDR: 30.0000 mg | DELAYED_RELEASE_CAPSULE | Freq: Every day | ORAL | Status: DC

## 2013-09-11 MED ORDER — LEVOTHYROXINE SODIUM 100 MCG PO TABS: 100.0000 ug | ORAL_TABLET | Freq: Every day | ORAL | Status: DC

## 2013-09-11 MED ORDER — TRIAMTERENE-HCTZ 37.5-25 MG PO CAPS: ORAL_CAPSULE | ORAL | Status: DC

## 2013-09-11 NOTE — Assessment & Plan Note (Signed)
Cr recently checked and was stable - 1.18.   Was instructed to stay hydrated.  Follow.

## 2013-09-11 NOTE — Assessment & Plan Note (Signed)
Colonoscopy 09/21/12.   F/u colonoscopy in five years.

## 2013-09-11 NOTE — Assessment & Plan Note (Signed)
S/P removal (Dr Jorene Minors) at [facility].  Elevated growth hormone.  History of acromegaly.  Followed by Dr Gabriel Carina.  On Cabergoline.  Doing well.  Last MRI (pituitary) - revealed no evidence of residual or recurrent disease - (09/10/10).  Seeing Dr Gabriel Carina.  Stable.  Have discussed f/u with Dr Tommi Rumps.  She is scheduled for f/u with Dr Gabriel Carina next month.

## 2013-09-11 NOTE — Progress Notes (Signed)
Subjective:    Patient ID: Marie Colon, female    DOB: 08-Apr-1955, 58 y.o.   MRN: [id number]  HPI 58 year old female with past history of hypercholesterolemia, hypertension, hypothyroidism, abnormal liver function and a pituitary macroadenoma with associated acromegaly (elevated growth hormone).  She comes in today for a scheduled follow up.  No increased cough or congestion.  Breathing stable.  Still seeing Dr Gabriel Carina.  Due to follow up with her next month.  Handling stress relatively well.  Overall she feels she is doing well.  Eating and drinking well.  Triglycerides are elevated.  She prefers referral back to [facility] for nutrition f/u and education.   Bowels stable.  Taking a probiotic.  Following with GI.  Taking prevacid 30mg  q day.  Recent liver enzymes are elevated.  Has been worked up and followed by GI.     Past Medical History  Diagnosis Date  . Hypertension   . Hypercholesterolemia   . Hypothyroidism   . Pituitary macroadenoma     s/p removal (elevated growth hormone)  . GERD (gastroesophageal reflux disease)   . Tubal pregnancy     s/p rupture  . Angiomyolipoma of kidney     evaluated by Dr Bernardo Heater    Current Outpatient Prescriptions on File Prior to Visit  Medication Sig Dispense Refill  . aspirin 81 MG chewable tablet Chew 81 mg by mouth daily.      . cabergoline (DOSTINEX) 0.5 MG tablet Take 0.5 mg by mouth Nightly.       . docusate sodium (COLACE) 100 MG capsule Take 100 mg by mouth daily.      . Multiple Vitamin (MULTIVITAMIN) tablet Take 1 tablet by mouth daily.      . polyethylene glycol powder (GLYCOLAX/MIRALAX) powder Take with your choice of fluid. This is safe to use on a daily basis if needed.  3350 g  1  . pravastatin (PRAVACHOL) 40 MG tablet TAKE ONE TABLET BY MOUTH ONCE DAILY  90 tablet  1  . Probiotic Product (PROBIOTIC DAILY PO) Take by mouth.       No current facility-administered medications on file prior to visit.    Review of Systems Patient  denies any headache, lightheadedness or dizziness.  No significant sinus or allergy symptoms currently.   No chest pain, tightness or palpitations.  No increased shortness of breath, cough or congestion.  No acid reflux reported.   No nausea or vomiting.  No abdominal pain or cramping.   No bowel change, such as diarrhea, constipation, BRBPR or melana.  Bowels better on probiotic.   No urine change.  Triglycerides elevated.  Discussed diet and exercise.        Objective:   Physical Exam  Filed Vitals:   09/11/13 0759  BP: 110/80  Pulse: 64  Temp: 98.5 F (36.9 C)   Blood pressure recheck:  37/12  58 year old female in no acute distress.   HEENT:  Nares- clear.  Oropharynx - without lesions. NECK:  Supple.  Nontender.  No audible bruit.  HEART:  Appears to be regular. LUNGS:  No crackles or wheezing audible.  Respirations even and unlabored.  RADIAL PULSE:  Equal bilaterally.   ABDOMEN:  Soft, nontender.  Bowel sounds present and normal.  No audible abdominal bruit.    EXTREMITIES:  No increased edema present.  DP pulses palpable and equal bilaterally.          Assessment & Plan:  CARDIOVASCULAR.  Currently asymptomatic.  Continue  risk factor modification.  Stress echo 07/29/11-  Normal stress ECHO with no evidence of ischemia.    INCREASED PSYCHOSOCIAL STRESSORS.  Doing well.  Follow.   GYN.  Stable.        HEALTH MAINTENANCE.  Physical 12/08/12.  Last mammogram 01/09/13 - BiRADS I.   Is s/p hysterectomy.

## 2013-09-11 NOTE — Assessment & Plan Note (Signed)
On Synthroid.  Follow TSH.

## 2013-09-11 NOTE — Assessment & Plan Note (Signed)
Mammogram 01/09/13 - Birads I.

## 2013-09-11 NOTE — Assessment & Plan Note (Signed)
Blood pressure under good control.  Same meds.  Follow metabolic panel.   

## 2013-09-11 NOTE — Assessment & Plan Note (Signed)
Liver function has been stable.  Follows with GI.  Recent liver panel check revealed AST/ALT increased (58/92).  Will recheck soon just to confirm stable.

## 2013-09-11 NOTE — Assessment & Plan Note (Signed)
Triglycerides increased (240 most recent check).  Discussed diet and exercise.  She request referral to [facility] for further evaluation and treatment recommendations.  Form completed.

## 2013-09-11 NOTE — Assessment & Plan Note (Signed)
Found incidentally on a previous CT/ultrasound.  Last CT (07/28/10) revealed - lower pole right renal angiomyolipoma.  Reviewed by Dr Bernardo Heater.  Rec follow up periodic ultrasound.

## 2013-09-11 NOTE — Progress Notes (Signed)
Pre visit review using our clinic review tool, if applicable. No additional management support is needed unless otherwise documented below in the visit note. 

## 2013-09-20 ENCOUNTER — Encounter: Payer: Self-pay | Admitting: Internal Medicine

## 2013-09-26 ENCOUNTER — Ambulatory Visit: Payer: Self-pay | Admitting: Internal Medicine

## 2013-10-26 ENCOUNTER — Ambulatory Visit: Payer: Self-pay | Admitting: Internal Medicine

## 2013-11-25 LAB — HEPATIC FUNCTION PANEL
ALT: 74 U/L — AB (ref 7–35)
AST: 45 U/L — AB (ref 13–35)
Alkaline Phosphatase: 88 U/L (ref 25–125)
Bilirubin, Direct: 0.07 mg/dL (ref 0.01–0.4)
Bilirubin, Total: 0.3 mg/dL

## 2013-11-28 ENCOUNTER — Encounter: Payer: Self-pay | Admitting: Internal Medicine

## 2013-11-28 ENCOUNTER — Telehealth: Payer: Self-pay | Admitting: Internal Medicine

## 2013-11-28 NOTE — Telephone Encounter (Signed)
Pt notified of liver panel results via my chart.

## 2013-11-30 NOTE — Telephone Encounter (Signed)
Unread mychart message mailed to patient 

## 2013-12-15 ENCOUNTER — Ambulatory Visit (INDEPENDENT_AMBULATORY_CARE_PROVIDER_SITE_OTHER): Payer: 59 | Admitting: Internal Medicine

## 2013-12-15 ENCOUNTER — Encounter: Payer: Self-pay | Admitting: Internal Medicine

## 2013-12-15 VITALS — BP 110/70 | HR 64 | Temp 98.9°F | Wt 175.8 lb

## 2013-12-15 DIAGNOSIS — Z8371 Family history of colonic polyps: Secondary | ICD-10-CM

## 2013-12-15 DIAGNOSIS — K760 Fatty (change of) liver, not elsewhere classified: Secondary | ICD-10-CM

## 2013-12-15 DIAGNOSIS — I10 Essential (primary) hypertension: Secondary | ICD-10-CM

## 2013-12-15 DIAGNOSIS — E7800 Pure hypercholesterolemia, unspecified: Secondary | ICD-10-CM

## 2013-12-15 DIAGNOSIS — E039 Hypothyroidism, unspecified: Secondary | ICD-10-CM

## 2013-12-15 DIAGNOSIS — D3000 Benign neoplasm of unspecified kidney: Secondary | ICD-10-CM

## 2013-12-15 DIAGNOSIS — Z6833 Body mass index (BMI) 33.0-33.9, adult: Secondary | ICD-10-CM | POA: Insufficient documentation

## 2013-12-15 DIAGNOSIS — D352 Benign neoplasm of pituitary gland: Secondary | ICD-10-CM

## 2013-12-15 DIAGNOSIS — E669 Obesity, unspecified: Secondary | ICD-10-CM

## 2013-12-15 NOTE — Progress Notes (Signed)
Pre visit review using our clinic review tool, if applicable. No additional management support is needed unless otherwise documented below in the visit note. 

## 2013-12-15 NOTE — Progress Notes (Signed)
Subjective:    Patient ID: Marie Colon, female    DOB: 13-May-1955, 58 y.o.   MRN: [id number]  HPI 58 year old female with past history of hypercholesterolemia, hypertension, hypothyroidism, abnormal liver function and a pituitary macroadenoma with associated acromegaly (elevated growth hormone).  She comes in today to follow up on these issues as well as for a complete physical exam.   No increased cough or congestion.  Breathing stable.  Recently saw Dr Gabriel Carina.  Planning for MRI 01/02/14.  Handling stress relatively well.  Overall she feels she is doing well.  Eating and drinking well.  Trying to watch her diet.  Plans to do better.  Plans to start walking more.   Bowels stable.  Following with GI.   Recent liver enzymes are still elevated but improved.  Has been worked up and followed by GI.     Past Medical History  Diagnosis Date  . Hypertension   . Hypercholesterolemia   . Hypothyroidism   . Pituitary macroadenoma     s/p removal (elevated growth hormone)  . GERD (gastroesophageal reflux disease)   . Tubal pregnancy     s/p rupture  . Angiomyolipoma of kidney     evaluated by Dr Bernardo Heater    Current Outpatient Prescriptions on File Prior to Visit  Medication Sig Dispense Refill  . aspirin 81 MG chewable tablet Chew 81 mg by mouth daily.    . cabergoline (DOSTINEX) 0.5 MG tablet Take 0.5 mg by mouth Nightly.     . docusate sodium (COLACE) 100 MG capsule Take 100 mg by mouth daily.    . lansoprazole (PREVACID) 30 MG capsule Take 1 capsule (30 mg total) by mouth daily at 12 noon. 90 capsule 1  . levothyroxine (SYNTHROID, LEVOTHROID) 100 MCG tablet Take 1 tablet (100 mcg total) by mouth daily. 90 tablet 3  . metoprolol (LOPRESSOR) 50 MG tablet TAKE ONE TABLET BY MOUTH 2 TIMES A DAY 180 tablet 3  . Multiple Vitamin (MULTIVITAMIN) tablet Take 1 tablet by mouth daily.    . polyethylene glycol powder (GLYCOLAX/MIRALAX) powder Take with your choice of fluid. This is safe to use on a daily  basis if needed. 3350 g 1  . pravastatin (PRAVACHOL) 40 MG tablet TAKE ONE TABLET BY MOUTH ONCE DAILY 90 tablet 1  . Probiotic Product (PROBIOTIC DAILY PO) Take by mouth.    . triamterene-hydrochlorothiazide (DYAZIDE) 37.5-25 MG per capsule TAKE ONE CAPSULE BY MOUTH DAILY 90 capsule 1   No current facility-administered medications on file prior to visit.    Review of Systems Patient denies any headache, lightheadedness or dizziness.  No significant sinus or allergy symptoms currently.   No chest pain, tightness or palpitations.  No increased shortness of breath, cough or congestion.  No acid reflux reported.   No nausea or vomiting.  No abdominal pain or cramping.   No bowel change, such as diarrhea, constipation, BRBPR or melana.  No urine change.  Discussed diet and exercise.   Plans to start walking more.       Objective:   Physical Exam  Filed Vitals:   12/15/13 0802  BP: 110/70  Pulse: 64  Temp: 98.9 F (37.2 C)   Blood pressure recheck:  41/59  58 year old female in no acute distress.   HEENT:  Nares- clear.  Oropharynx - without lesions. NECK:  Supple.  Nontender.  No audible bruit.  HEART:  Appears to be regular. LUNGS:  No crackles or wheezing audible.  Respirations even and unlabored.  RADIAL PULSE:  Equal bilaterally.    BREASTS:  No nipple discharge or nipple retraction present.  Could not appreciate any distinct nodules or axillary adenopathy.  ABDOMEN:  Soft, nontender.  Bowel sounds present and normal.  No audible abdominal bruit.  GU:  Not performed.    EXTREMITIES:  No increased edema present.  DP pulses palpable and equal bilaterally.          Assessment & Plan:  CARDIOVASCULAR.  Currently asymptomatic.  Continue risk factor modification.  Stress echo 07/29/11-  Normal stress ECHO with no evidence of ischemia.    INCREASED PSYCHOSOCIAL STRESSORS.  Doing well.  Follow.   GYN.  Stable.    Essential hypertension Blood pressure doing well.  Follow.  Follow  metabolic panel.   Fatty liver Diet and exercise.  Follow liver function.  Has been worked up by GI.   Pituitary macroadenoma Planning for MRI 01/02/14.  Seeing Dr Gabriel Carina.   Hypothyroidism, unspecified hypothyroidism type On thyroid replacement.  Follow. Tsh.   Angiomyolipoma of kidney, unspecified laterality Found incidentally on a previous CT/ultrasound.  Last CT (07/28/10) revealed lower pole right renal angiomyloipoma.  Reviewed by Dr Bernardo Heater.  Recommended f/u periodic ultrasound.   6. Hypercholesteremia Low cholesterol diet and exercise.  Plans to start exercising more.  On pravastatin.  Follow lipid panel and liver function tests.  Lab Results  Component Value Date   CHOL 202* 08/30/2013   HDL 40 08/30/2013   LDLCALC 114 08/30/2013   TRIG 240* 08/30/2013   7. Family history of colonic polyps Colonoscopy 09/21/12 - normal.  Recommended f/u colonoscopy in five years.    8. Obesity (BMI 30-39.9) Diet and exercise.        HEALTH MAINTENANCE.  Physical today.  Last mammogram 01/09/13 - BiRADS I.  She will schedule f/u mammogram.   Is s/p hysterectomy.

## 2013-12-18 ENCOUNTER — Other Ambulatory Visit: Payer: Self-pay | Admitting: Internal Medicine

## 2013-12-18 ENCOUNTER — Encounter: Payer: Self-pay | Admitting: Internal Medicine

## 2013-12-27 ENCOUNTER — Encounter: Payer: Self-pay | Admitting: Internal Medicine

## 2014-01-02 ENCOUNTER — Ambulatory Visit: Payer: Self-pay

## 2014-01-17 ENCOUNTER — Encounter: Payer: Self-pay | Admitting: Internal Medicine

## 2014-01-17 LAB — LIPID PANEL
Cholesterol: 202 mg/dL — AB (ref 0–200)
HDL: 41 mg/dL (ref 35–70)
LDL Cholesterol: 118 mg/dL
Triglycerides: 216 mg/dL — AB (ref 40–160)

## 2014-01-17 LAB — HEPATIC FUNCTION PANEL
ALT: 73 U/L — AB (ref 7–35)
AST: 46 U/L — AB (ref 13–35)
Alkaline Phosphatase: 86 U/L (ref 25–125)
BILIRUBIN DIRECT: 0.1 mg/dL (ref 0.01–0.4)
Bilirubin, Total: 0.3 mg/dL

## 2014-01-17 LAB — BASIC METABOLIC PANEL
BUN: 11 mg/dL (ref 4–21)
Creatinine: 1 mg/dL (ref 0.5–1.1)
GLUCOSE: 95 mg/dL
POTASSIUM: 3.8 mmol/L (ref 3.4–5.3)
Sodium: 144 mmol/L (ref 137–147)

## 2014-01-17 LAB — TSH: TSH: 1.5 u[IU]/mL (ref 0.41–5.90)

## 2014-01-28 ENCOUNTER — Telehealth: Payer: Self-pay | Admitting: Internal Medicine

## 2014-01-28 NOTE — Telephone Encounter (Signed)
Pt notified of lab results via mychart. 

## 2014-01-31 ENCOUNTER — Ambulatory Visit: Payer: Self-pay | Admitting: Internal Medicine

## 2014-01-31 LAB — HM MAMMOGRAPHY

## 2014-02-01 ENCOUNTER — Encounter: Payer: Self-pay | Admitting: Internal Medicine

## 2014-02-02 ENCOUNTER — Encounter: Payer: Self-pay | Admitting: Internal Medicine

## 2014-02-08 ENCOUNTER — Ambulatory Visit: Payer: Self-pay | Admitting: Internal Medicine

## 2014-02-08 LAB — HM MAMMOGRAPHY: HM Mammogram: NEGATIVE

## 2014-02-09 ENCOUNTER — Encounter: Payer: Self-pay | Admitting: Internal Medicine

## 2014-03-26 ENCOUNTER — Other Ambulatory Visit: Payer: Self-pay | Admitting: Internal Medicine

## 2014-04-26 ENCOUNTER — Encounter: Payer: Self-pay | Admitting: Internal Medicine

## 2014-04-27 ENCOUNTER — Ambulatory Visit (INDEPENDENT_AMBULATORY_CARE_PROVIDER_SITE_OTHER): Payer: 59 | Admitting: Internal Medicine

## 2014-04-27 ENCOUNTER — Encounter: Payer: Self-pay | Admitting: Internal Medicine

## 2014-04-27 VITALS — BP 117/73 | HR 66 | Temp 98.0°F | Ht 63.0 in | Wt 178.1 lb

## 2014-04-27 DIAGNOSIS — Z803 Family history of malignant neoplasm of breast: Secondary | ICD-10-CM

## 2014-04-27 DIAGNOSIS — Z83719 Family history of colon polyps, unspecified: Secondary | ICD-10-CM

## 2014-04-27 DIAGNOSIS — D3000 Benign neoplasm of unspecified kidney: Secondary | ICD-10-CM

## 2014-04-27 DIAGNOSIS — I10 Essential (primary) hypertension: Secondary | ICD-10-CM

## 2014-04-27 DIAGNOSIS — E7800 Pure hypercholesterolemia, unspecified: Secondary | ICD-10-CM

## 2014-04-27 DIAGNOSIS — K760 Fatty (change of) liver, not elsewhere classified: Secondary | ICD-10-CM | POA: Diagnosis not present

## 2014-04-27 DIAGNOSIS — E039 Hypothyroidism, unspecified: Secondary | ICD-10-CM | POA: Diagnosis not present

## 2014-04-27 DIAGNOSIS — L989 Disorder of the skin and subcutaneous tissue, unspecified: Secondary | ICD-10-CM | POA: Diagnosis not present

## 2014-04-27 DIAGNOSIS — Z8371 Family history of colonic polyps: Secondary | ICD-10-CM

## 2014-04-27 DIAGNOSIS — E669 Obesity, unspecified: Secondary | ICD-10-CM

## 2014-04-27 DIAGNOSIS — Z0000 Encounter for general adult medical examination without abnormal findings: Secondary | ICD-10-CM

## 2014-04-27 DIAGNOSIS — D352 Benign neoplasm of pituitary gland: Secondary | ICD-10-CM

## 2014-04-27 NOTE — Progress Notes (Signed)
Patient ID: Marie Colon, female   DOB: 1955/05/04, 59 y.o.   MRN: [id number]   Subjective:    Patient ID: Marie Colon, female    DOB: 1955-03-18, 59 y.o.   MRN: [id number]  HPI  Patient here for a scheduled follow up.  Some increased stress with work.  Also with her granddaughter.  Born premature.  Is back in the hospital now.  She feels she is handling things well.  No cardiac symptoms with increased activity or exertion.  Breathing stable.  Discussed diet and exercise.  Bowels stable.  No acid reflux.  No abdominal pain.     Past Medical History  Diagnosis Date  . Hypertension   . Hypercholesterolemia   . Hypothyroidism   . Pituitary macroadenoma     s/p removal (elevated growth hormone)  . GERD (gastroesophageal reflux disease)   . Tubal pregnancy     s/p rupture  . Angiomyolipoma of kidney     evaluated by Dr Bernardo Heater    Current Outpatient Prescriptions on File Prior to Visit  Medication Sig Dispense Refill  . aspirin 81 MG chewable tablet Chew 81 mg by mouth daily.    . cabergoline (DOSTINEX) 0.5 MG tablet Take 0.5 mg by mouth Nightly.     . docusate sodium (COLACE) 100 MG capsule Take 100 mg by mouth daily.    . lansoprazole (PREVACID) 30 MG capsule Take 1 capsule (30 mg total) by mouth daily at 12 noon. 90 capsule 1  . levothyroxine (SYNTHROID, LEVOTHROID) 100 MCG tablet Take 1 tablet (100 mcg total) by mouth daily. 90 tablet 3  . metoprolol (LOPRESSOR) 50 MG tablet TAKE ONE TABLET BY MOUTH 2 TIMES A DAY 180 tablet 3  . Multiple Vitamin (MULTIVITAMIN) tablet Take 1 tablet by mouth daily.    . polyethylene glycol powder (GLYCOLAX/MIRALAX) powder Take with your choice of fluid. This is safe to use on a daily basis if needed. 3350 g 1  . pravastatin (PRAVACHOL) 40 MG tablet TAKE ONE TABLET BY MOUTH ONCE DAILY 90 tablet 1  . Probiotic Product (PROBIOTIC DAILY PO) Take by mouth.    . triamterene-hydrochlorothiazide (DYAZIDE) 37.5-25 MG per capsule TAKE ONE CAPSULE BY  MOUTH DAILY 90 capsule 1   No current facility-administered medications on file prior to visit.    Review of Systems  Constitutional: Negative for appetite change and unexpected weight change.  HENT: Negative for congestion and sinus pressure.   Respiratory: Negative for cough, chest tightness and shortness of breath.   Cardiovascular: Negative for chest pain and palpitations.  Gastrointestinal: Negative for nausea, vomiting, abdominal pain and diarrhea.  Neurological: Negative for dizziness, light-headedness and headaches.       Objective:     Blood pressure recheck:  110/74  Physical Exam  Constitutional: She appears well-developed and well-nourished. No distress.  HENT:  Nose: Nose normal.  Mouth/Throat: Oropharynx is clear and moist.  Neck: Neck supple. No thyromegaly present.  Cardiovascular: Normal rate and regular rhythm.   Pulmonary/Chest: Breath sounds normal. No respiratory distress. She has no wheezes.  Abdominal: Soft. Bowel sounds are normal. There is no tenderness.  Musculoskeletal: She exhibits no edema or tenderness.  Lymphadenopathy:    She has no cervical adenopathy.  Skin: No rash noted. No erythema.    BP 117/73 mmHg  Pulse 66  Temp(Src) 98 F (36.7 C) (Oral)  Ht 5\' 3"  (1.6 m)  Wt 178 lb 2 oz (80.797 kg)  BMI 31.56 kg/m2  SpO2 99% Wt Readings  from Last 3 Encounters:  04/27/14 178 lb 2 oz (80.797 kg)  12/15/13 175 lb 12 oz (79.72 kg)  09/11/13 177 lb 4 oz (80.4 kg)     Lab Results  Component Value Date   WBC 5.6 03/09/2013   HGB 14.3 03/09/2013   HCT 41.1 03/09/2013   PLT 210 03/09/2013   GLUCOSE 97 03/09/2013   CHOL 202* 01/17/2014   TRIG 216* 01/17/2014   HDL 41 01/17/2014   LDLCALC 118 01/17/2014   ALT 73* 01/17/2014   AST 46* 01/17/2014   NA 144 01/17/2014   K 3.8 01/17/2014   CL 100 03/09/2013   CREATININE 1.0 01/17/2014   BUN 11 01/17/2014   CO2 25 03/09/2013   TSH 1.50 01/17/2014       Assessment & Plan:   Problem  List Items Addressed This Visit    Angiomyolipoma of kidney    Found incidentally on previous CT/ultrasound.  2013 abdominal ultrasound commented - kidneys normal.  Follow with periodic ultrasound.        Family history of breast cancer    Mammogram 02/08/14 - Birads I.       Family history of colonic polyps    Colonoscopy 09/21/12 - normal.  Recommended f/u colonoscopy in five years.        Fatty liver    Liver function has been stable.  Follows with GI.  See their notes for details.        Health care maintenance    Physical 12/15/13.  Mammogram 02/08/14 - Birads I.   Colonoscopy 09/21/12 - normal.  Recommended f/u colonoscopy in 5 years.        Hypercholesteremia    Low cholesterol diet and exercise.  Follow lipid panel.  Check lipid panel with next labs.        Hypertension    Blood pressure under good control.  Same medication regimen.  Follow pressures.  Follow metabolic panel.        Hypothyroidism    On thyroid replacement.  Dr Gabriel Carina just checked.  Continue to follow.        Obesity (BMI 30-39.9)    Diet and exercise.        Pituitary macroadenoma    S/p removal (Dr Derrill Memo) at [facility].  Elevated growth hormone.  History of acromegaly.  Followed by Dr Gabriel Carina.  On Cabergoline.  Doing well.  She is following with periodic MRI.  States due 12/2015.        Scalp lesion - Primary    Persistent scalp lesions.  Refer to dermatology for evaluation.       Relevant Orders   Ambulatory referral to Dermatology       Einar Pheasant, MD

## 2014-04-27 NOTE — Progress Notes (Signed)
Pre visit review using our clinic review tool, if applicable. No additional management support is needed unless otherwise documented below in the visit note. 

## 2014-04-29 ENCOUNTER — Encounter: Payer: Self-pay | Admitting: Internal Medicine

## 2014-04-29 DIAGNOSIS — L989 Disorder of the skin and subcutaneous tissue, unspecified: Secondary | ICD-10-CM | POA: Insufficient documentation

## 2014-04-29 DIAGNOSIS — Z0000 Encounter for general adult medical examination without abnormal findings: Secondary | ICD-10-CM | POA: Insufficient documentation

## 2014-04-29 NOTE — Assessment & Plan Note (Signed)
Physical 12/15/13.  Mammogram 02/08/14 - Birads I.   Colonoscopy 09/21/12 - normal.  Recommended f/u colonoscopy in 5 years.

## 2014-04-29 NOTE — Assessment & Plan Note (Signed)
Colonoscopy 09/21/12 - normal.  Recommended f/u colonoscopy in five years.

## 2014-04-29 NOTE — Assessment & Plan Note (Signed)
Mammogram 02/08/14 - Birads I.

## 2014-04-29 NOTE — Assessment & Plan Note (Signed)
Liver function has been stable.  Follows with GI.  See their notes for details.

## 2014-04-29 NOTE — Assessment & Plan Note (Signed)
S/p removal (Dr Derrill Memo) at [facility].  Elevated growth hormone.  History of acromegaly.  Followed by Dr Gabriel Carina.  On Cabergoline.  Doing well.  She is following with periodic MRI.  States due 12/2015.

## 2014-04-29 NOTE — Assessment & Plan Note (Signed)
Blood pressure under good control.  Same medication regimen.  Follow pressures.  Follow metabolic panel.  

## 2014-04-29 NOTE — Assessment & Plan Note (Signed)
Persistent scalp lesions.  Refer to dermatology for evaluation.

## 2014-04-29 NOTE — Assessment & Plan Note (Signed)
Found incidentally on previous CT/ultrasound.  2013 abdominal ultrasound commented - kidneys normal.  Follow with periodic ultrasound.

## 2014-04-29 NOTE — Assessment & Plan Note (Signed)
Diet and exercise.   

## 2014-04-29 NOTE — Assessment & Plan Note (Signed)
On thyroid replacement.  Dr Gabriel Carina just checked.  Continue to follow.

## 2014-04-29 NOTE — Assessment & Plan Note (Signed)
Low cholesterol diet and exercise.  Follow lipid panel.  Check lipid panel with next labs.

## 2014-05-04 ENCOUNTER — Ambulatory Visit: Payer: 59 | Admitting: Internal Medicine

## 2014-06-06 ENCOUNTER — Encounter: Payer: Self-pay | Admitting: Internal Medicine

## 2014-06-12 ENCOUNTER — Telehealth: Payer: Self-pay | Admitting: *Deleted

## 2014-06-12 NOTE — Telephone Encounter (Signed)
Pt states that she needs a referral to Dr. Lucky Cowboy (changed insurance to [facility]). She was seen in April & next appt in September. Needs referral to cover both.

## 2014-06-12 NOTE — Telephone Encounter (Signed)
Need to know reason for f/u, then will get order for referral placed.  Thanks

## 2014-06-13 NOTE — Telephone Encounter (Signed)
Apparently this was an error & documented in the wrong chart. Please disregard this phone note.

## 2014-06-13 NOTE — Telephone Encounter (Signed)
LMTCB

## 2014-06-25 ENCOUNTER — Telehealth: Payer: Self-pay | Admitting: Internal Medicine

## 2014-06-25 NOTE — Telephone Encounter (Signed)
Pt notified of lab results via my chart.  Keep f/u with GI

## 2014-07-20 ENCOUNTER — Other Ambulatory Visit: Payer: Self-pay | Admitting: Internal Medicine

## 2014-07-23 ENCOUNTER — Other Ambulatory Visit: Payer: Self-pay | Admitting: Internal Medicine

## 2014-08-27 ENCOUNTER — Ambulatory Visit (INDEPENDENT_AMBULATORY_CARE_PROVIDER_SITE_OTHER): Payer: 59 | Admitting: Internal Medicine

## 2014-08-27 ENCOUNTER — Encounter: Payer: Self-pay | Admitting: Internal Medicine

## 2014-08-27 VITALS — BP 110/70 | HR 56 | Temp 98.6°F | Ht 63.0 in | Wt 175.4 lb

## 2014-08-27 DIAGNOSIS — E669 Obesity, unspecified: Secondary | ICD-10-CM

## 2014-08-27 DIAGNOSIS — D3000 Benign neoplasm of unspecified kidney: Secondary | ICD-10-CM

## 2014-08-27 DIAGNOSIS — D352 Benign neoplasm of pituitary gland: Secondary | ICD-10-CM

## 2014-08-27 DIAGNOSIS — Z83719 Family history of colon polyps, unspecified: Secondary | ICD-10-CM

## 2014-08-27 DIAGNOSIS — Z803 Family history of malignant neoplasm of breast: Secondary | ICD-10-CM

## 2014-08-27 DIAGNOSIS — E7800 Pure hypercholesterolemia, unspecified: Secondary | ICD-10-CM

## 2014-08-27 DIAGNOSIS — Z0000 Encounter for general adult medical examination without abnormal findings: Secondary | ICD-10-CM

## 2014-08-27 DIAGNOSIS — K760 Fatty (change of) liver, not elsewhere classified: Secondary | ICD-10-CM | POA: Diagnosis not present

## 2014-08-27 DIAGNOSIS — L989 Disorder of the skin and subcutaneous tissue, unspecified: Secondary | ICD-10-CM

## 2014-08-27 DIAGNOSIS — N289 Disorder of kidney and ureter, unspecified: Secondary | ICD-10-CM

## 2014-08-27 DIAGNOSIS — Z8371 Family history of colonic polyps: Secondary | ICD-10-CM

## 2014-08-27 DIAGNOSIS — I10 Essential (primary) hypertension: Secondary | ICD-10-CM | POA: Diagnosis not present

## 2014-08-27 DIAGNOSIS — E039 Hypothyroidism, unspecified: Secondary | ICD-10-CM | POA: Diagnosis not present

## 2014-08-27 MED ORDER — METOPROLOL TARTRATE 50 MG PO TABS: ORAL_TABLET | ORAL | Status: DC

## 2014-08-27 MED ORDER — TRIAMTERENE-HCTZ 37.5-25 MG PO CAPS: 1.0000 | ORAL_CAPSULE | Freq: Every day | ORAL | Status: DC

## 2014-08-27 MED ORDER — LEVOTHYROXINE SODIUM 100 MCG PO TABS: 100.0000 ug | ORAL_TABLET | Freq: Every day | ORAL | Status: DC

## 2014-08-27 MED ORDER — PRAVASTATIN SODIUM 40 MG PO TABS: 40.0000 mg | ORAL_TABLET | Freq: Every day | ORAL | Status: DC

## 2014-08-27 NOTE — Progress Notes (Signed)
Patient ID: Anastasio Champion, female   DOB: 1955-12-31, 59 y.o.   MRN: [id number]   Subjective:    Patient ID: Anastasio Champion, female    DOB: Feb 26, 1955, 59 y.o.   MRN: [id number]  HPI  Patient here for a scheduled follow up.  Tries to stay active.  No sob.  Eating and drinking well.  No abdominal pain or cramping.  Bowels stable.  Has not followed up with GI recently.  Due f/u for her kidney.  Handling stress well.  Discussed diet and exercise.    Past Medical History  Diagnosis Date  . Hypertension   . Hypercholesterolemia   . Hypothyroidism   . Pituitary macroadenoma     s/p removal (elevated growth hormone)  . GERD (gastroesophageal reflux disease)   . Tubal pregnancy     s/p rupture  . Angiomyolipoma of kidney     evaluated by Dr Bernardo Heater    Outpatient Encounter Prescriptions as of 08/27/2014  Medication Sig  . aspirin 81 MG chewable tablet Chew 81 mg by mouth daily.  . cabergoline (DOSTINEX) 0.5 MG tablet Take 0.5 mg by mouth Nightly.   . docusate sodium (COLACE) 100 MG capsule Take 100 mg by mouth daily.  . lansoprazole (PREVACID) 30 MG capsule TAKE 1 CAPSULE BY MOUTH DAILY AT 12 NOON.  Marland Kitchen levothyroxine (SYNTHROID, LEVOTHROID) 100 MCG tablet Take 1 tablet (100 mcg total) by mouth daily.  . metoprolol (LOPRESSOR) 50 MG tablet TAKE ONE TABLET BY MOUTH 2 TIMES A DAY  . Multiple Vitamin (MULTIVITAMIN) tablet Take 1 tablet by mouth daily.  . polyethylene glycol powder (GLYCOLAX/MIRALAX) powder Take with your choice of fluid. This is safe to use on a daily basis if needed.  . pravastatin (PRAVACHOL) 40 MG tablet Take 1 tablet (40 mg total) by mouth daily.  . Probiotic Product (PROBIOTIC DAILY PO) Take by mouth.  . triamterene-hydrochlorothiazide (DYAZIDE) 37.5-25 MG per capsule Take 1 each (1 capsule total) by mouth daily.  . [DISCONTINUED] levothyroxine (SYNTHROID, LEVOTHROID) 100 MCG tablet Take 1 tablet (100 mcg total) by mouth daily.  . [DISCONTINUED] metoprolol (LOPRESSOR) 50  MG tablet TAKE ONE TABLET BY MOUTH 2 TIMES A DAY  . [DISCONTINUED] pravastatin (PRAVACHOL) 40 MG tablet TAKE ONE TABLET BY MOUTH ONCE DAILY  . [DISCONTINUED] triamterene-hydrochlorothiazide (DYAZIDE) 37.5-25 MG per capsule TAKE ONE CAPSULE BY MOUTH DAILY   No facility-administered encounter medications on file as of 08/27/2014.    Review of Systems  Constitutional: Negative for appetite change and unexpected weight change.  HENT: Negative for congestion and sinus pressure.   Respiratory: Negative for cough, chest tightness and shortness of breath.   Cardiovascular: Negative for chest pain, palpitations and leg swelling.  Gastrointestinal: Negative for nausea, vomiting, abdominal pain and diarrhea.  Genitourinary: Negative for dysuria and difficulty urinating.  Skin: Negative for color change and rash.       Saw dermatology for scalp lesion.   Neurological: Negative for dizziness, light-headedness and headaches.  Psychiatric/Behavioral: Negative for dysphoric mood and agitation.       Objective:    Physical Exam  Constitutional: She appears well-developed and well-nourished. No distress.  HENT:  Nose: Nose normal.  Mouth/Throat: Oropharynx is clear and moist.  Neck: Neck supple. No thyromegaly present.  Cardiovascular: Normal rate and regular rhythm.   Pulmonary/Chest: Breath sounds normal. No respiratory distress. She has no wheezes.  Abdominal: Soft. Bowel sounds are normal. There is no tenderness.  Musculoskeletal: She exhibits no edema or tenderness.  Lymphadenopathy:  She has no cervical adenopathy.  Skin: No rash noted. No erythema.  Healing lesion - scalp.  Seeing dermatology.   Psychiatric: She has a normal mood and affect. Her behavior is normal.    BP 110/70 mmHg  Pulse 56  Temp(Src) 98.6 F (37 C) (Oral)  Ht 5\' 3"  (1.6 m)  Wt 175 lb 6 oz (79.55 kg)  BMI 31.07 kg/m2  SpO2 96% Wt Readings from Last 3 Encounters:  08/27/14 175 lb 6 oz (79.55 kg)  04/27/14 178  lb 2 oz (80.797 kg)  12/15/13 175 lb 12 oz (79.72 kg)     Lab Results  Component Value Date   WBC 5.6 03/09/2013   HGB 14.3 03/09/2013   HCT 41.1 03/09/2013   PLT 210 03/09/2013   GLUCOSE 97 03/09/2013   CHOL 202* 01/17/2014   TRIG 216* 01/17/2014   HDL 41 01/17/2014   LDLCALC 118 01/17/2014   ALT 73* 01/17/2014   AST 46* 01/17/2014   NA 144 01/17/2014   K 3.8 01/17/2014   CL 100 03/09/2013   CREATININE 1.0 01/17/2014   BUN 11 01/17/2014   CO2 25 03/09/2013   TSH 1.50 01/17/2014       Assessment & Plan:   Problem List Items Addressed This Visit    Angiomyolipoma of kidney    Found incidentally on previous CT/ultrasound.  2013 ultrasound commented - kidneys normal.  Urology recommended f/u with periodic ultrasound.  Will recheck.  She desires to do the ultrasound, instead of referral back to urology.       Relevant Orders   US Abdomen Complete   Family history of breast cancer    Mammogram 02/08/14 - Birads I.       Family history of colonic polyps    Colonoscopy 09/21/12 - normal.  Recommended f/u colonoscopy in five years.       Fatty liver    Liver function tests have been stable.  Was following with GI.  Has not seen recently.  Discussed referral back.  She wants to hold at this point.  Check abdominal ultrasound.  AST  55 and ALT 84 (05/2014).        Relevant Orders   US Abdomen Complete   Health care maintenance    Physical 12/15/13.  Mammogram 02/08/14 - Birads I.  Colonoscopy 09/21/12 - normal.  Recommended f/u in 5 years.        Hypercholesteremia    On pravastatin.  Low cholesterol diet and exercise.  Follow lipid panel and liver function tests.       Relevant Medications   metoprolol (LOPRESSOR) 50 MG tablet   pravastatin (PRAVACHOL) 40 MG tablet   triamterene-hydrochlorothiazide (DYAZIDE) 37.5-25 MG per capsule   Hypertension - Primary    Blood pressure under good control.  Continue same medication regimen.  Follow pressures.  Follow metabolic  panel.  Cr just checked 1.16.  Follow.       Relevant Medications   metoprolol (LOPRESSOR) 50 MG tablet   pravastatin (PRAVACHOL) 40 MG tablet   triamterene-hydrochlorothiazide (DYAZIDE) 37.5-25 MG per capsule   Hypothyroidism    On thyroid replacement.  Follow tsh.        Relevant Medications   levothyroxine (SYNTHROID, LEVOTHROID) 100 MCG tablet   metoprolol (LOPRESSOR) 50 MG tablet   Obesity (BMI 30-39.9)    Diet and exercise.       Pituitary macroadenoma    S/p removal (Dr Derrill Memo) at [facility].  Elevated growth hormone.  History of  acromegaly.  Followed by Dr Gabriel Carina.  On cabergoline.  Dong well.        Renal insufficiency    Cr 05/2014 stable - 1.16.  Stay hydrated.       Scalp lesion    Removed.  Saw dermatology.  Continues f/u.          I spent 25 minutes with the patient and more than 50% of the time was spent in consultation regarding the above.     Einar Pheasant, MD

## 2014-08-27 NOTE — Progress Notes (Signed)
Pre visit review using our clinic review tool, if applicable. No additional management support is needed unless otherwise documented below in the visit note. 

## 2014-08-28 ENCOUNTER — Encounter: Payer: Self-pay | Admitting: Internal Medicine

## 2014-08-28 NOTE — Assessment & Plan Note (Signed)
Physical 12/15/13.  Mammogram 02/08/14 - Birads I.  Colonoscopy 09/21/12 - normal.  Recommended f/u in 5 years.

## 2014-08-28 NOTE — Assessment & Plan Note (Signed)
On thyroid replacement.  Follow tsh.  

## 2014-08-28 NOTE — Assessment & Plan Note (Signed)
On pravastatin.  Low cholesterol diet and exercise.  Follow lipid panel and liver function tests.   

## 2014-08-28 NOTE — Assessment & Plan Note (Signed)
Removed.  Saw dermatology.  Continues f/u.

## 2014-08-28 NOTE — Assessment & Plan Note (Signed)
Cr 05/2014 stable - 1.16.  Stay hydrated.

## 2014-08-28 NOTE — Assessment & Plan Note (Signed)
S/p removal (Dr Derrill Memo) at [facility].  Elevated growth hormone.  History of acromegaly.  Followed by Dr Gabriel Carina.  On cabergoline.  Dong well.

## 2014-08-28 NOTE — Assessment & Plan Note (Signed)
Liver function tests have been stable.  Was following with GI.  Has not seen recently.  Discussed referral back.  She wants to hold at this point.  Check abdominal ultrasound.  AST  55 and ALT 84 (05/2014).

## 2014-08-28 NOTE — Assessment & Plan Note (Signed)
Blood pressure under good control.  Continue same medication regimen.  Follow pressures.  Follow metabolic panel.  Cr just checked 1.16.  Follow.

## 2014-08-28 NOTE — Assessment & Plan Note (Signed)
Colonoscopy 09/21/12 - normal.  Recommended f/u colonoscopy in five years.

## 2014-08-28 NOTE — Assessment & Plan Note (Signed)
Mammogram 02/08/14 - Birads I.

## 2014-08-28 NOTE — Assessment & Plan Note (Signed)
Diet and exercise.   

## 2014-08-28 NOTE — Assessment & Plan Note (Signed)
Found incidentally on previous CT/ultrasound.  2013 ultrasound commented - kidneys normal.  Urology recommended f/u with periodic ultrasound.  Will recheck.  She desires to do the ultrasound, instead of referral back to urology.

## 2014-09-11 ENCOUNTER — Ambulatory Visit
Admission: RE | Admit: 2014-09-11 | Discharge: 2014-09-11 | Disposition: A | Discharge status: Home / Self Care | Payer: 59 | Source: Ambulatory Visit | Attending: Internal Medicine | Admitting: Internal Medicine

## 2014-09-11 DIAGNOSIS — D1771 Benign lipomatous neoplasm of kidney: Secondary | ICD-10-CM | POA: Diagnosis not present

## 2014-09-11 DIAGNOSIS — D3000 Benign neoplasm of unspecified kidney: Secondary | ICD-10-CM

## 2014-09-11 DIAGNOSIS — K760 Fatty (change of) liver, not elsewhere classified: Secondary | ICD-10-CM | POA: Diagnosis not present

## 2014-09-12 ENCOUNTER — Encounter: Payer: Self-pay | Admitting: Internal Medicine

## 2014-12-14 LAB — BASIC METABOLIC PANEL
BUN: 14 mg/dL (ref 4–21)
Creatinine: 1.1 mg/dL (ref 0.5–1.1)
Glucose: 97 mg/dL
Potassium: 3.7 mmol/L (ref 3.4–5.3)
SODIUM: 142 mmol/L (ref 137–147)

## 2014-12-14 LAB — HEPATIC FUNCTION PANEL
ALT: 67 U/L — AB (ref 7–35)
AST: 49 U/L — AB (ref 13–35)
Alkaline Phosphatase: 89 U/L (ref 25–125)
BILIRUBIN DIRECT: 0.1 mg/dL (ref 0.01–0.4)
Bilirubin, Total: 0.3 mg/dL

## 2014-12-14 LAB — LIPID PANEL
CHOLESTEROL: 216 mg/dL — AB (ref 0–200)
HDL: 40 mg/dL (ref 35–70)
LDL CALC: 133 mg/dL
TRIGLYCERIDES: 216 mg/dL — AB (ref 40–160)

## 2014-12-14 LAB — TSH: TSH: 3.8 u[IU]/mL (ref 0.41–5.90)

## 2014-12-18 ENCOUNTER — Encounter: Payer: Self-pay | Admitting: Internal Medicine

## 2014-12-31 ENCOUNTER — Other Ambulatory Visit: Payer: Self-pay | Admitting: Internal Medicine

## 2015-01-03 ENCOUNTER — Ambulatory Visit (INDEPENDENT_AMBULATORY_CARE_PROVIDER_SITE_OTHER): Payer: 59 | Admitting: Internal Medicine

## 2015-01-03 ENCOUNTER — Encounter: Payer: Self-pay | Admitting: Internal Medicine

## 2015-01-03 ENCOUNTER — Other Ambulatory Visit (HOSPITAL_COMMUNITY)
Admission: RE | Admit: 2015-01-03 | Discharge: 2015-01-03 | Disposition: A | Discharge status: Home / Self Care | Payer: 59 | Source: Ambulatory Visit | Attending: Internal Medicine | Admitting: Internal Medicine

## 2015-01-03 VITALS — BP 110/78 | HR 65 | Temp 98.5°F | Resp 17 | Ht 62.0 in | Wt 178.2 lb

## 2015-01-03 DIAGNOSIS — Z8371 Family history of colonic polyps: Secondary | ICD-10-CM

## 2015-01-03 DIAGNOSIS — M799 Soft tissue disorder, unspecified: Secondary | ICD-10-CM

## 2015-01-03 DIAGNOSIS — Z1151 Encounter for screening for human papillomavirus (HPV): Secondary | ICD-10-CM | POA: Diagnosis not present

## 2015-01-03 DIAGNOSIS — Z124 Encounter for screening for malignant neoplasm of cervix: Secondary | ICD-10-CM | POA: Diagnosis not present

## 2015-01-03 DIAGNOSIS — D3000 Benign neoplasm of unspecified kidney: Secondary | ICD-10-CM

## 2015-01-03 DIAGNOSIS — E039 Hypothyroidism, unspecified: Secondary | ICD-10-CM

## 2015-01-03 DIAGNOSIS — Z01419 Encounter for gynecological examination (general) (routine) without abnormal findings: Secondary | ICD-10-CM | POA: Diagnosis present

## 2015-01-03 DIAGNOSIS — N289 Disorder of kidney and ureter, unspecified: Secondary | ICD-10-CM

## 2015-01-03 DIAGNOSIS — Z1239 Encounter for other screening for malignant neoplasm of breast: Secondary | ICD-10-CM

## 2015-01-03 DIAGNOSIS — D352 Benign neoplasm of pituitary gland: Secondary | ICD-10-CM

## 2015-01-03 DIAGNOSIS — M7989 Other specified soft tissue disorders: Secondary | ICD-10-CM

## 2015-01-03 DIAGNOSIS — I10 Essential (primary) hypertension: Secondary | ICD-10-CM

## 2015-01-03 DIAGNOSIS — K760 Fatty (change of) liver, not elsewhere classified: Secondary | ICD-10-CM

## 2015-01-03 DIAGNOSIS — E7800 Pure hypercholesterolemia, unspecified: Secondary | ICD-10-CM

## 2015-01-03 DIAGNOSIS — E669 Obesity, unspecified: Secondary | ICD-10-CM

## 2015-01-03 DIAGNOSIS — Z0000 Encounter for general adult medical examination without abnormal findings: Secondary | ICD-10-CM

## 2015-01-03 NOTE — Progress Notes (Signed)
Patient ID: Marie Colon, female   DOB: 1955/04/01, 59 y.o.   MRN: [id number]   Subjective:    Patient ID: Marie Colon, female    DOB: 1955/04/17, 59 y.o.   MRN: [id number]  HPI  Patient with past history of hypercholesterolemia, GERD, hypertension, pituitary macroadenoma and hypothyroidism.  She comes in today to follow up on these issues as well as for a complete physical exam.  She reports she is doing relatively well.  Has noticed some soreness in the left mid low back.  No significant pain.  Intermittent.  Appears to be positional.  Discussed stretching, exercise and posture.  Does have right upper thigh mass.  Enlarging.  No pain.  No chest pain or tightness.  No sob.  No acid reflux.  No abdominal pain or cramping.  Bowels stable.  Seeing Dr Gabriel Carina for her f/u elevated GH.     Past Medical History  Diagnosis Date  . Hypertension   . Hypercholesterolemia   . Hypothyroidism   . Pituitary macroadenoma (HCC)     s/p removal (elevated growth hormone)  . GERD (gastroesophageal reflux disease)   . Tubal pregnancy     s/p rupture  . Angiomyolipoma of kidney     evaluated by Dr Bernardo Heater   Past Surgical History  Procedure Laterality Date  . Dilation and curettage of uterus    . Abdominal hysterectomy      abnormal uterine bleeding  . Appendectomy    . Cholecystectomy     Family History  Problem Relation Age of Onset  . Brain cancer Father     died - 44  . Diabetes Mother   . Hypothyroidism Sister   . Sjogren's syndrome Sister   . Diabetes      grandmother   Social History   Social History  . Marital Status: Married    Spouse Name: N/A  . Number of Children: 2  . Years of Education: N/A   Occupational History  .      [employer] - [employer]   Social History Main Topics  . Smoking status: Never Smoker   . Smokeless tobacco: Never Used  . Alcohol Use: No  . Drug Use: No  . Sexual Activity: Not Asked   Other Topics Concern  . None   Social History Narrative     Outpatient Encounter Prescriptions as of 01/03/2015  Medication Sig  . aspirin 81 MG chewable tablet Chew 81 mg by mouth daily.  . cabergoline (DOSTINEX) 0.5 MG tablet Take 0.5 mg by mouth Nightly.   . docusate sodium (COLACE) 100 MG capsule Take 100 mg by mouth daily.  . lansoprazole (PREVACID) 30 MG capsule TAKE 1 CAPSULE BY MOUTH DAILY AT 12 NOON.  Marland Kitchen levothyroxine (SYNTHROID, LEVOTHROID) 100 MCG tablet Take 1 tablet (100 mcg total) by mouth daily.  . metoprolol (LOPRESSOR) 50 MG tablet TAKE ONE TABLET BY MOUTH 2 TIMES A DAY  . Multiple Vitamin (MULTIVITAMIN) tablet Take 1 tablet by mouth daily.  . polyethylene glycol powder (GLYCOLAX/MIRALAX) powder Take with your choice of fluid. This is safe to use on a daily basis if needed.  . pravastatin (PRAVACHOL) 40 MG tablet Take 1 tablet (40 mg total) by mouth daily.  . Probiotic Product (PROBIOTIC DAILY PO) Take by mouth.  . triamterene-hydrochlorothiazide (DYAZIDE) 37.5-25 MG per capsule Take 1 each (1 capsule total) by mouth daily.   No facility-administered encounter medications on file as of 01/03/2015.    Review of Systems  Constitutional: Negative  for appetite change and unexpected weight change.  HENT: Negative for congestion and sinus pressure.   Eyes: Negative for pain and discharge.  Respiratory: Negative for cough, chest tightness and shortness of breath.   Cardiovascular: Negative for chest pain, palpitations and leg swelling.  Gastrointestinal: Negative for nausea, vomiting, abdominal pain and diarrhea.  Genitourinary: Negative for dysuria and difficulty urinating.  Musculoskeletal: Positive for back pain. Negative for joint swelling.  Skin: Negative for color change and rash.  Neurological: Negative for dizziness, light-headedness and headaches.  Psychiatric/Behavioral: Negative for dysphoric mood and agitation.       Objective:     Blood pressure rechecked by me:  120/78  Physical Exam  Constitutional: She is  oriented to person, place, and time. She appears well-developed and well-nourished. No distress.  HENT:  Nose: Nose normal.  Mouth/Throat: Oropharynx is clear and moist.  Eyes: Right eye exhibits no discharge. Left eye exhibits no discharge. No scleral icterus.  Neck: Neck supple. No thyromegaly present.  Cardiovascular: Normal rate and regular rhythm.   Pulmonary/Chest: Breath sounds normal. No accessory muscle usage. No tachypnea. No respiratory distress. She has no decreased breath sounds. She has no wheezes. She has no rhonchi. Right breast exhibits no inverted nipple, no mass, no nipple discharge and no tenderness (no axillary adenopathy). Left breast exhibits no inverted nipple, no mass, no nipple discharge and no tenderness (no axilarry adenopathy).  Abdominal: Soft. Bowel sounds are normal. There is no tenderness.  Genitourinary:  Normal external genitalia.  Vaginal vault without lesions.  Cervix identified.  Pap smear performed.  Could not appreciate any adnexal masses or tenderness.    Musculoskeletal: She exhibits no edema or tenderness.  No back pain with straight leg raise.    Lymphadenopathy:    She has no cervical adenopathy.  Neurological: She is alert and oriented to person, place, and time.  Skin: Skin is warm. No rash noted. No erythema.  Increased soft tissue mass - upper lateral thigh.  No pain.    Psychiatric: She has a normal mood and affect. Her behavior is normal.    BP 110/78 mmHg  Pulse 65  Temp(Src) 98.5 F (36.9 C) (Oral)  Resp 17  Ht 5\' 2"  (1.575 m)  Wt 178 lb 4 oz (80.854 kg)  BMI 32.59 kg/m2  SpO2 98% Wt Readings from Last 3 Encounters:  01/03/15 178 lb 4 oz (80.854 kg)  08/27/14 175 lb 6 oz (79.55 kg)  04/27/14 178 lb 2 oz (80.797 kg)     Lab Results  Component Value Date   WBC 5.6 03/09/2013   HGB 14.3 03/09/2013   HCT 41.1 03/09/2013   PLT 210 03/09/2013   GLUCOSE 97 03/09/2013   CHOL 216* 12/14/2014   TRIG 216* 12/14/2014   HDL 40  12/14/2014   LDLCALC 133 12/14/2014   ALT 67* 12/14/2014   AST 49* 12/14/2014   NA 142 12/14/2014   K 3.7 12/14/2014   CL 100 03/09/2013   CREATININE 1.1 12/14/2014   BUN 14 12/14/2014   CO2 25 03/09/2013   TSH 3.80 12/14/2014    US Abdomen Complete  09/11/2014  CLINICAL DATA:  Abnormal liver function tests. Right renal angiomyolipoma. EXAM: ULTRASOUND ABDOMEN COMPLETE COMPARISON:  CT 07/28/2010. FINDINGS: Gallbladder: No gallstones or wall thickening visualized. No sonographic Murphy sign noted. Common bile duct: Diameter: 4.2 mm Liver: Liver is echogenic consistent with fatty infiltration and/or hepatocellular disease. No focal hepatic abnormality identified. IVC: No abnormality visualized. Pancreas: Visualized portion unremarkable. Spleen:  Size and appearance within normal limits. Right Kidney: Length: 10.6 cm. Echogenicity within normal limits. Tiny hyperechoic 1 cm lesion right lower pole consistent previously identified angiomyolipoma. No hydronephrosis. Left Kidney: Length: 9.1 cm. Echogenicity within normal limits. No mass or hydronephrosis visualized. Abdominal aorta: No aneurysm visualized. Other findings: None. IMPRESSION: 1. Liver is echogenic consistent with fatty infiltration and/or hepatocellular disease. No gallstones or biliary distention. 2.  Stable angiomyolipoma right kidney . Electronically Signed   By: Marcello Moores  Register   On: 09/11/2014 08:26       Assessment & Plan:   Problem List Items Addressed This Visit    Angiomyolipoma of kidney    Recent ultrasound - stable.        Family history of colonic polyps    Colonoscopy 09/21/12 - normal.  Recommended f/u colonoscopy in five years.        Fatty liver    Liver function tests still elevated but stable.  Has been worked up by GI.  Abdominal ultrasound as outlined.  Follow AST and ALT.        Health care maintenance    Physical today 01/03/15.  PAP 01/10/17.  Mammogram 02/08/14 - Birads I.  Schedule f/u mammogram.   Colonoscopy 09/21/12 - normal.  Recommended f/u colonoscopy in five years.        Hypercholesteremia    Low cholesterol diet and exercise.  Follow lipid panel and liver function tests.  On pravastatin.   Lab Results  Component Value Date   CHOL 216* 12/14/2014   HDL 40 12/14/2014   LDLCALC 133 12/14/2014   TRIG 216* 12/14/2014        Hypertension    Blood pressure under good control.  Continue same medication regimen.  Follow pressures.  Follow metabolic panel.        Hypothyroidism    On thyroid replacement.  Follow tsh.        Obesity (BMI 30-39.9)    Diet and exercise.  Follow.       Pituitary macroadenoma [facility])    S/p removal (Dr Derrill Memo) at [facility]. Elevated growth hormone.  History of acromegaly.  Followed by Dr Gabriel Carina.  On cabergoline.  Doing well.  See her note for details.        Renal insufficiency    Cr just checked and stable at 1.1.  Stay hydrated.  Follow.        Soft tissue mass - Primary    Outer thigh mass.  Enlarging.  Refer to surgery for evaluation.        Relevant Orders   Ambulatory referral to General Surgery    Other Visit Diagnoses    Breast cancer screening        Relevant Orders    MM DIGITAL SCREENING BILATERAL    Pap smear for cervical cancer screening        Relevant Orders    Cytology - PAP        Einar Pheasant, MD

## 2015-01-03 NOTE — Progress Notes (Signed)
Pre-visit discussion using our clinic review tool. No additional management support is needed unless otherwise documented below in the visit note.  

## 2015-01-06 ENCOUNTER — Encounter: Payer: Self-pay | Admitting: Internal Medicine

## 2015-01-06 DIAGNOSIS — M7989 Other specified soft tissue disorders: Secondary | ICD-10-CM | POA: Insufficient documentation

## 2015-01-06 NOTE — Assessment & Plan Note (Signed)
Cr just checked and stable at 1.1.  Stay hydrated.  Follow.

## 2015-01-06 NOTE — Assessment & Plan Note (Signed)
Physical today 01/03/15.  PAP 01/10/17.  Mammogram 02/08/14 - Birads I.  Schedule f/u mammogram.  Colonoscopy 09/21/12 - normal.  Recommended f/u colonoscopy in five years.

## 2015-01-06 NOTE — Assessment & Plan Note (Signed)
On thyroid replacement.  Follow tsh.  

## 2015-01-06 NOTE — Assessment & Plan Note (Signed)
Blood pressure under good control.  Continue same medication regimen.  Follow pressures.  Follow metabolic panel.   

## 2015-01-06 NOTE — Assessment & Plan Note (Signed)
Recent ultrasound - stable.   

## 2015-01-06 NOTE — Assessment & Plan Note (Signed)
Outer thigh mass.  Enlarging.  Refer to surgery for evaluation.

## 2015-01-06 NOTE — Assessment & Plan Note (Signed)
S/p removal (Dr Derrill Memo) at [facility]. Elevated growth hormone.  History of acromegaly.  Followed by Dr Gabriel Carina.  On cabergoline.  Doing well.  See her note for details.

## 2015-01-06 NOTE — Assessment & Plan Note (Signed)
Liver function tests still elevated but stable.  Has been worked up by GI.  Abdominal ultrasound as outlined.  Follow AST and ALT.

## 2015-01-06 NOTE — Assessment & Plan Note (Signed)
Colonoscopy 09/21/12 - normal.  Recommended f/u colonoscopy in five years.   

## 2015-01-06 NOTE — Assessment & Plan Note (Signed)
Diet and exercise.  Follow.  

## 2015-01-06 NOTE — Assessment & Plan Note (Signed)
Low cholesterol diet and exercise.  Follow lipid panel and liver function tests.  On pravastatin.   Lab Results  Component Value Date   CHOL 216* 12/14/2014   HDL 40 12/14/2014   LDLCALC 133 12/14/2014   TRIG 216* 12/14/2014

## 2015-01-07 LAB — CYTOLOGY - PAP

## 2015-01-08 ENCOUNTER — Encounter: Payer: Self-pay | Admitting: Internal Medicine

## 2015-02-11 ENCOUNTER — Ambulatory Visit
Admission: RE | Admit: 2015-02-11 | Discharge: 2015-02-11 | Disposition: A | Discharge status: Home / Self Care | Payer: 59 | Source: Ambulatory Visit | Attending: Internal Medicine | Admitting: Internal Medicine

## 2015-02-11 DIAGNOSIS — Z1239 Encounter for other screening for malignant neoplasm of breast: Secondary | ICD-10-CM

## 2015-02-12 ENCOUNTER — Ambulatory Visit
Admission: RE | Admit: 2015-02-12 | Discharge: 2015-02-12 | Disposition: A | Discharge status: Home / Self Care | Payer: 59 | Source: Ambulatory Visit | Attending: Internal Medicine | Admitting: Internal Medicine

## 2015-02-12 ENCOUNTER — Other Ambulatory Visit: Payer: Self-pay | Admitting: Internal Medicine

## 2015-02-12 DIAGNOSIS — Z1231 Encounter for screening mammogram for malignant neoplasm of breast: Secondary | ICD-10-CM | POA: Insufficient documentation

## 2015-02-12 DIAGNOSIS — Z1239 Encounter for other screening for malignant neoplasm of breast: Secondary | ICD-10-CM

## 2015-02-18 ENCOUNTER — Other Ambulatory Visit: Payer: Self-pay | Admitting: Surgery

## 2015-02-18 DIAGNOSIS — IMO0002 Reserved for concepts with insufficient information to code with codable children: Secondary | ICD-10-CM

## 2015-02-18 DIAGNOSIS — R2241 Localized swelling, mass and lump, right lower limb: Secondary | ICD-10-CM | POA: Diagnosis not present

## 2015-02-18 DIAGNOSIS — R229 Localized swelling, mass and lump, unspecified: Principal | ICD-10-CM

## 2015-03-11 ENCOUNTER — Encounter: Payer: Self-pay | Admitting: Internal Medicine

## 2015-03-12 ENCOUNTER — Ambulatory Visit: Payer: 59

## 2015-03-14 ENCOUNTER — Ambulatory Visit
Admission: RE | Admit: 2015-03-14 | Discharge: 2015-03-14 | Disposition: A | Discharge status: Home / Self Care | Payer: 59 | Source: Ambulatory Visit | Attending: Surgery | Admitting: Surgery

## 2015-03-14 DIAGNOSIS — IMO0002 Reserved for concepts with insufficient information to code with codable children: Secondary | ICD-10-CM

## 2015-03-14 DIAGNOSIS — R2241 Localized swelling, mass and lump, right lower limb: Secondary | ICD-10-CM | POA: Insufficient documentation

## 2015-03-14 DIAGNOSIS — R229 Localized swelling, mass and lump, unspecified: Secondary | ICD-10-CM

## 2015-03-14 DIAGNOSIS — M7989 Other specified soft tissue disorders: Secondary | ICD-10-CM | POA: Diagnosis not present

## 2015-03-14 DIAGNOSIS — M25551 Pain in right hip: Secondary | ICD-10-CM | POA: Diagnosis not present

## 2015-03-14 LAB — POCT I-STAT CREATININE: CREATININE: 1.3 mg/dL — AB (ref 0.44–1.00)

## 2015-03-14 MED ORDER — GADOBENATE DIMEGLUMINE 529 MG/ML IV SOLN
15.0000 mL | Freq: Once | INTRAVENOUS | Status: AC | PRN
  Administered 2015-03-14: 15 mL via INTRAVENOUS

## 2015-03-18 ENCOUNTER — Other Ambulatory Visit: Payer: Self-pay | Admitting: Internal Medicine

## 2015-03-29 DIAGNOSIS — E031 Congenital hypothyroidism without goiter: Secondary | ICD-10-CM | POA: Diagnosis not present

## 2015-03-29 DIAGNOSIS — R945 Abnormal results of liver function studies: Secondary | ICD-10-CM | POA: Diagnosis not present

## 2015-03-29 DIAGNOSIS — E220 Acromegaly and pituitary gigantism: Secondary | ICD-10-CM | POA: Diagnosis not present

## 2015-04-01 DIAGNOSIS — D179 Benign lipomatous neoplasm, unspecified: Secondary | ICD-10-CM | POA: Diagnosis not present

## 2015-04-16 DIAGNOSIS — R748 Abnormal levels of other serum enzymes: Secondary | ICD-10-CM | POA: Diagnosis not present

## 2015-04-16 DIAGNOSIS — E039 Hypothyroidism, unspecified: Secondary | ICD-10-CM | POA: Diagnosis not present

## 2015-04-16 DIAGNOSIS — E220 Acromegaly and pituitary gigantism: Secondary | ICD-10-CM | POA: Diagnosis not present

## 2015-04-30 DIAGNOSIS — R2241 Localized swelling, mass and lump, right lower limb: Secondary | ICD-10-CM | POA: Diagnosis not present

## 2015-05-08 ENCOUNTER — Ambulatory Visit (INDEPENDENT_AMBULATORY_CARE_PROVIDER_SITE_OTHER): Payer: 59 | Admitting: Internal Medicine

## 2015-05-08 ENCOUNTER — Encounter: Payer: Self-pay | Admitting: Internal Medicine

## 2015-05-08 VITALS — BP 110/70 | HR 66 | Temp 98.6°F | Resp 18 | Ht 62.0 in | Wt 177.0 lb

## 2015-05-08 DIAGNOSIS — D352 Benign neoplasm of pituitary gland: Secondary | ICD-10-CM | POA: Diagnosis not present

## 2015-05-08 DIAGNOSIS — M7989 Other specified soft tissue disorders: Secondary | ICD-10-CM

## 2015-05-08 DIAGNOSIS — E7800 Pure hypercholesterolemia, unspecified: Secondary | ICD-10-CM

## 2015-05-08 DIAGNOSIS — K760 Fatty (change of) liver, not elsewhere classified: Secondary | ICD-10-CM | POA: Diagnosis not present

## 2015-05-08 DIAGNOSIS — E669 Obesity, unspecified: Secondary | ICD-10-CM

## 2015-05-08 DIAGNOSIS — I10 Essential (primary) hypertension: Secondary | ICD-10-CM

## 2015-05-08 DIAGNOSIS — E039 Hypothyroidism, unspecified: Secondary | ICD-10-CM

## 2015-05-08 DIAGNOSIS — M799 Soft tissue disorder, unspecified: Secondary | ICD-10-CM

## 2015-05-08 DIAGNOSIS — N289 Disorder of kidney and ureter, unspecified: Secondary | ICD-10-CM

## 2015-05-08 MED ORDER — ALPRAZOLAM 0.25 MG PO TABS: 0.2500 mg | ORAL_TABLET | Freq: Two times a day (BID) | ORAL | Status: DC | PRN

## 2015-05-08 MED ORDER — AZITHROMYCIN 250 MG PO TABS: ORAL_TABLET | ORAL | Status: DC

## 2015-05-08 NOTE — Progress Notes (Signed)
Patient ID: Marie Colon, female   DOB: March 25, 1955, 60 y.o.   MRN: [id number]   Subjective:    Patient ID: Marie Colon, female    DOB: Jul 05, 1955, 60 y.o.   MRN: [id number]  HPI  Patient here for a scheduled follow up.  Recently evaluated by endocrinology.  See note.  Recommended continuing cabergoline and thyroid medication increased.  Has f/u thyroid test scheduled.  She saw Dr Tamala Julian.  Referred to Dr Barry Dienes.  Felt to have intramuscular lipoma.  Planning for removal in 07/2015.  She is planning to travel overseas.  Planning to f/u in the travel clinic.  Needs something to help her stay calm on the flight.  Discussed diet and exercise.  Breathing stable.  No chest pain or tightness.  No sob.  No acid reflux.  No abdominal pain or cramping.  Bowels stable.    Past Medical History  Diagnosis Date  . Hypertension   . Hypercholesterolemia   . Hypothyroidism   . Pituitary macroadenoma (HCC)     s/p removal (elevated growth hormone)  . GERD (gastroesophageal reflux disease)   . Tubal pregnancy     s/p rupture  . Angiomyolipoma of kidney     evaluated by Dr Bernardo Heater   Past Surgical History  Procedure Laterality Date  . Dilation and curettage of uterus    . Abdominal hysterectomy      abnormal uterine bleeding  . Appendectomy    . Cholecystectomy     Family History  Problem Relation Age of Onset  . Brain cancer Father     died - 21  . Diabetes Mother   . Hypothyroidism Sister   . Breast cancer Sister 52    2 sisters. Age 38 and 49  . Sjogren's syndrome Sister   . Diabetes      grandmother   Social History   Social History  . Marital Status: Married    Spouse Name: N/A  . Number of Children: 2  . Years of Education: N/A   Occupational History  .      [employer] - [employer]   Social History Main Topics  . Smoking status: Never Smoker   . Smokeless tobacco: Never Used  . Alcohol Use: No  . Drug Use: No  . Sexual Activity: Not Asked   Other Topics Concern  . None    Social History Narrative    Outpatient Encounter Prescriptions as of 05/08/2015  Medication Sig  . aspirin 81 MG chewable tablet Chew 81 mg by mouth daily.  . cabergoline (DOSTINEX) 0.5 MG tablet Take 0.5 mg by mouth Nightly.   . docusate sodium (COLACE) 100 MG capsule Take 100 mg by mouth daily.  . lansoprazole (PREVACID) 30 MG capsule TAKE 1 CAPSULE BY MOUTH DAILY AT 12 NOON.  Marland Kitchen levothyroxine (SYNTHROID, LEVOTHROID) 112 MCG tablet Take by mouth.  . metoprolol (LOPRESSOR) 50 MG tablet TAKE ONE TABLET BY MOUTH 2 TIMES A DAY  . Multiple Vitamin (MULTIVITAMIN) tablet Take 1 tablet by mouth daily.  . polyethylene glycol powder (GLYCOLAX/MIRALAX) powder Take with your choice of fluid. This is safe to use on a daily basis if needed.  . pravastatin (PRAVACHOL) 40 MG tablet Take 1 tablet (40 mg total) by mouth daily.  . Probiotic Product (PROBIOTIC DAILY PO) Take by mouth.  . triamterene-hydrochlorothiazide (DYAZIDE) 37.5-25 MG capsule TAKE 1 CAPSULE BY MOUTH ONCE DAILY  . [DISCONTINUED] levothyroxine (SYNTHROID, LEVOTHROID) 100 MCG tablet Take 1 tablet (100 mcg total) by mouth daily.  Marland Kitchen  ALPRAZolam (XANAX) 0.25 MG tablet Take 1 tablet (0.25 mg total) by mouth 2 (two) times daily as needed for anxiety.  Marland Kitchen azithromycin (ZITHROMAX) 250 MG tablet Take 2 tablets x 1 day and then one tablet per day for four more days.   No facility-administered encounter medications on file as of 05/08/2015.    Review of Systems  Constitutional: Negative for fatigue and unexpected weight change.  HENT: Negative for congestion and sinus pressure.   Respiratory: Negative for cough, chest tightness and shortness of breath.   Cardiovascular: Negative for chest pain, palpitations and leg swelling.  Gastrointestinal: Negative for nausea, vomiting, abdominal pain and diarrhea.  Genitourinary: Negative for dysuria and difficulty urinating.  Musculoskeletal: Negative for back pain and joint swelling.  Skin: Negative for  color change and rash.  Neurological: Negative for dizziness, light-headedness and headaches.  Psychiatric/Behavioral: Negative for dysphoric mood and agitation.       Increased stress regarding flying.        Objective:     Blood pressure rechecked by me:  118/78  Physical Exam  Constitutional: She appears well-developed and well-nourished. No distress.  HENT:  Nose: Nose normal.  Mouth/Throat: Oropharynx is clear and moist.  Neck: Neck supple. No thyromegaly present.  Cardiovascular: Normal rate and regular rhythm.   Pulmonary/Chest: Breath sounds normal. No respiratory distress. She has no wheezes.  Abdominal: Soft. Bowel sounds are normal. There is no tenderness.  Musculoskeletal: She exhibits no edema or tenderness.  Lymphadenopathy:    She has no cervical adenopathy.  Skin: No rash noted. No erythema.  Psychiatric: She has a normal mood and affect. Her behavior is normal.    BP 110/70 mmHg  Pulse 66  Temp(Src) 98.6 F (37 C) (Oral)  Resp 18  Ht 5\' 2"  (1.575 m)  Wt 177 lb (80.287 kg)  BMI 32.37 kg/m2  SpO2 97% Wt Readings from Last 3 Encounters:  05/08/15 177 lb (80.287 kg)  01/03/15 178 lb 4 oz (80.854 kg)  08/27/14 175 lb 6 oz (79.55 kg)     Lab Results  Component Value Date   WBC 5.6 03/09/2013   HGB 14.3 03/09/2013   HCT 41.1 03/09/2013   PLT 210 03/09/2013   GLUCOSE 97 03/09/2013   CHOL 216* 12/14/2014   TRIG 216* 12/14/2014   HDL 40 12/14/2014   LDLCALC 133 12/14/2014   ALT 67* 12/14/2014   AST 49* 12/14/2014   NA 142 12/14/2014   K 3.7 12/14/2014   CL 100 03/09/2013   CREATININE 1.30* 03/14/2015   BUN 14 12/14/2014   CO2 25 03/09/2013   TSH 3.80 12/14/2014    Mr Hip Right W Wo Contrast  03/14/2015  CLINICAL DATA:  Stiffness and pain after sitting. Soft tissue swelling. EXAM: MRI OF THE RIGHT HIP WITHOUT AND WITH CONTRAST TECHNIQUE: Multiplanar, multisequence MR imaging was performed both before and after administration of intravenous  contrast. CONTRAST:  41mL MULTIHANCE GADOBENATE DIMEGLUMINE 529 MG/ML IV SOLN COMPARISON:  None. FINDINGS: Bones: No focal marrow signal abnormality. No fracture, dislocation or avascular necrosis. Normal sacrum and sacroiliac joints. Articular cartilage and labrum Articular cartilage:  No focal chondral defect. Labrum: Grossly intact, but evaluation is limited by lack of intraarticular fluid. Joint or bursal effusion Joint effusion:  No joint effusion. Bursae:  No bursa formation. Muscles and tendons Flexors: Normal. Extensors: Expansion of the right tensor fascia lata of muscle with interspersed areas of fat likely reflecting lipomatous hypertrophy of the tensor fascia lata muscle. Abductors: Normal. Adductors:  Normal. Rotators: Normal. Hamstrings: Normal. Other findings Miscellaneous: No pelvic fluid collection. No pelvic free fluid. No hematoma. No inguinal lymphadenopathy. No inguinal hernia. IMPRESSION: 1. Expansion of the right tensor fascia lata of muscle with interspersed areas of fat likely reflecting lipomatous hypertrophy of the right tensor fascia lata muscle. Electronically Signed   By: Kathreen Devoid   On: 03/14/2015 09:19       Assessment & Plan:   Problem List Items Addressed This Visit    Fatty liver    Liver function tests have been stable.  Has been worked up by GI.  Recheck liver panel with next labs.       Hypercholesteremia    On pravastatin.  Low cholesterol diet and exercise.  Follow lipid panel and liver function tests.        Hypertension - Primary    Blood pressure under good control.  Continue same medication regimen.  Follow pressures.  Follow metabolic panel.        Hypothyroidism    On thyroid replacement.  Follow tsh.  Just checked by Dr Gabriel Carina.  Thyroid medication adjusted.        Relevant Medications   levothyroxine (SYNTHROID, LEVOTHROID) 112 MCG tablet   Obesity (BMI 30-39.9)    Discussed diet and exercise.        Pituitary macroadenoma ([place])    S/p  removal by Dr Derrill Memo.  Elevated growth hormone.  History of acromegaly.  Followed by Dr Gabriel Carina.  See her note.  On cabergoline.  Stable.  Follow.        Renal insufficiency    Stay hydrated.  Follow renal function.        Soft tissue mass    Seeing Dr Norman Clay.  Planning for removal.  Obtain records.            Einar Pheasant, MD

## 2015-05-08 NOTE — Progress Notes (Signed)
Pre-visit discussion using our clinic review tool. No additional management support is needed unless otherwise documented below in the visit note.  

## 2015-05-09 ENCOUNTER — Encounter: Payer: Self-pay | Admitting: Internal Medicine

## 2015-05-09 NOTE — Assessment & Plan Note (Signed)
On thyroid replacement.  Follow tsh.  Just checked by Dr Gabriel Carina.  Thyroid medication adjusted.

## 2015-05-09 NOTE — Assessment & Plan Note (Signed)
Blood pressure under good control.  Continue same medication regimen.  Follow pressures.  Follow metabolic panel.   

## 2015-05-09 NOTE — Assessment & Plan Note (Signed)
S/p removal by Dr Derrill Memo.  Elevated growth hormone.  History of acromegaly.  Followed by Dr Gabriel Carina.  See her note.  On cabergoline.  Stable.  Follow.

## 2015-05-09 NOTE — Assessment & Plan Note (Signed)
Stay hydrated.  Follow renal function.  

## 2015-05-09 NOTE — Assessment & Plan Note (Signed)
On pravastatin.  Low cholesterol diet and exercise.  Follow lipid panel and liver function tests.   

## 2015-05-09 NOTE — Assessment & Plan Note (Signed)
Seeing Dr Norman Clay.  Planning for removal.  Obtain records.

## 2015-05-09 NOTE — Assessment & Plan Note (Signed)
Discussed diet and exercise 

## 2015-05-09 NOTE — Assessment & Plan Note (Signed)
Liver function tests have been stable.  Has been worked up by GI.  Recheck liver panel with next labs.

## 2015-05-23 ENCOUNTER — Encounter: Payer: Self-pay | Admitting: Internal Medicine

## 2015-05-24 ENCOUNTER — Ambulatory Visit (INDEPENDENT_AMBULATORY_CARE_PROVIDER_SITE_OTHER): Payer: 59 | Admitting: Internal Medicine

## 2015-05-24 ENCOUNTER — Encounter: Payer: Self-pay | Admitting: Internal Medicine

## 2015-05-24 ENCOUNTER — Telehealth: Payer: Self-pay | Admitting: *Deleted

## 2015-05-24 VITALS — BP 102/64 | HR 77 | Temp 98.3°F | Resp 14 | Ht 62.0 in | Wt 174.6 lb

## 2015-05-24 DIAGNOSIS — M10072 Idiopathic gout, left ankle and foot: Secondary | ICD-10-CM

## 2015-05-24 DIAGNOSIS — M109 Gout, unspecified: Secondary | ICD-10-CM

## 2015-05-24 LAB — URIC ACID: URIC ACID, SERUM: 7.2 mg/dL — AB (ref 2.4–7.0)

## 2015-05-24 LAB — CREATININE, SERUM: Creatinine, Ser: 1.04 mg/dL (ref 0.40–1.20)

## 2015-05-24 MED ORDER — COLCHICINE 0.6 MG PO TABS: 0.6000 mg | ORAL_TABLET | Freq: Two times a day (BID) | ORAL | Status: DC

## 2015-05-24 NOTE — Progress Notes (Signed)
Pre visit review using our clinic review tool, if applicable. No additional management support is needed unless otherwise documented below in the visit note. 

## 2015-05-24 NOTE — Telephone Encounter (Signed)
See if she can come in at 1:15 today.  Work in for this.

## 2015-05-24 NOTE — Progress Notes (Signed)
Patient ID: Marie Colon, female   DOB: 1955/04/22, 60 y.o.   MRN: [id number]   Subjective:    Patient ID: Marie Colon, female    DOB: 09/25/1955, 60 y.o.   MRN: [id number]  HPI  Patient here as a work in with concerns regarding left great toe pain.  States pain started a few days ago.  Has worsened.  States did not want bed sheets to touch.  Is red and swollen.  No injury or trauma.  No acid reflux.  No abdominal pain or cramping.  No other joint pains or rash.    Past Medical History  Diagnosis Date  . Hypertension   . Hypercholesterolemia   . Hypothyroidism   . Pituitary macroadenoma (HCC)     s/p removal (elevated growth hormone)  . GERD (gastroesophageal reflux disease)   . Tubal pregnancy     s/p rupture  . Angiomyolipoma of kidney     evaluated by Dr Bernardo Heater   Past Surgical History  Procedure Laterality Date  . Dilation and curettage of uterus    . Abdominal hysterectomy      abnormal uterine bleeding  . Appendectomy    . Cholecystectomy     Family History  Problem Relation Age of Onset  . Brain cancer Father     died - 2  . Diabetes Mother   . Hypothyroidism Sister   . Breast cancer Sister 56    2 sisters. Age 47 and 8  . Sjogren's syndrome Sister   . Diabetes      grandmother   Social History   Social History  . Marital Status: Married    Spouse Name: N/A  . Number of Children: 2  . Years of Education: N/A   Occupational History  .      [employer] - [employer]   Social History Main Topics  . Smoking status: Never Smoker   . Smokeless tobacco: Never Used  . Alcohol Use: No  . Drug Use: No  . Sexual Activity: Not Asked   Other Topics Concern  . None   Social History Narrative    Outpatient Encounter Prescriptions as of 05/24/2015  Medication Sig  . ALPRAZolam (XANAX) 0.25 MG tablet Take 1 tablet (0.25 mg total) by mouth 2 (two) times daily as needed for anxiety.  Marland Kitchen aspirin 81 MG chewable tablet Chew 81 mg by mouth daily.  . cabergoline  (DOSTINEX) 0.5 MG tablet Take 0.5 mg by mouth Nightly.   . docusate sodium (COLACE) 100 MG capsule Take 100 mg by mouth daily.  . lansoprazole (PREVACID) 30 MG capsule TAKE 1 CAPSULE BY MOUTH DAILY AT 12 NOON.  Marland Kitchen levothyroxine (SYNTHROID, LEVOTHROID) 112 MCG tablet Take by mouth.  . metoprolol (LOPRESSOR) 50 MG tablet TAKE ONE TABLET BY MOUTH 2 TIMES A DAY  . Multiple Vitamin (MULTIVITAMIN) tablet Take 1 tablet by mouth daily.  . polyethylene glycol powder (GLYCOLAX/MIRALAX) powder Take with your choice of fluid. This is safe to use on a daily basis if needed.  . pravastatin (PRAVACHOL) 40 MG tablet Take 1 tablet (40 mg total) by mouth daily.  . Probiotic Product (PROBIOTIC DAILY PO) Take by mouth.  . triamterene-hydrochlorothiazide (DYAZIDE) 37.5-25 MG capsule TAKE 1 CAPSULE BY MOUTH ONCE DAILY  . azithromycin (ZITHROMAX) 250 MG tablet Take 2 tablets x 1 day and then one tablet per day for four more days. (Patient not taking: Reported on 05/24/2015)  . colchicine 0.6 MG tablet Take 1 tablet (0.6 mg total) by  mouth 2 (two) times daily.   No facility-administered encounter medications on file as of 05/24/2015.    Review of Systems  Constitutional: Negative for fever and chills.  Respiratory: Negative for cough and shortness of breath.   Cardiovascular: Negative for chest pain and leg swelling.  Gastrointestinal: Negative for nausea, vomiting and diarrhea.  Musculoskeletal:       Increased pain - left great toe - base of toe.    Skin:       Increased erythema - left toe and base of toe.         Objective:    Physical Exam  Constitutional: She appears well-developed and well-nourished. No distress.  HENT:  Nose: Nose normal.  Mouth/Throat: Oropharynx is clear and moist.  Cardiovascular: Normal rate and regular rhythm.   Pulmonary/Chest: Breath sounds normal. No respiratory distress. She has no wheezes.  Musculoskeletal: She exhibits no edema.  Increased pain - base of left great toe.   Increased erythema.  No pain over metatarsal.  No pain with flexion of toe.      BP 102/64 mmHg  Pulse 77  Temp(Src) 98.3 F (36.8 C) (Oral)  Resp 14  Ht 5\' 2"  (1.575 m)  Wt 174 lb 9.6 oz (79.198 kg)  BMI 31.93 kg/m2  SpO2 97% Wt Readings from Last 3 Encounters:  05/24/15 174 lb 9.6 oz (79.198 kg)  05/08/15 177 lb (80.287 kg)  01/03/15 178 lb 4 oz (80.854 kg)     Lab Results  Component Value Date   WBC 5.6 03/09/2013   HGB 14.3 03/09/2013   HCT 41.1 03/09/2013   PLT 210 03/09/2013   GLUCOSE 97 03/09/2013   CHOL 216* 12/14/2014   TRIG 216* 12/14/2014   HDL 40 12/14/2014   LDLCALC 133 12/14/2014   ALT 67* 12/14/2014   AST 49* 12/14/2014   NA 142 12/14/2014   K 3.7 12/14/2014   CL 100 03/09/2013   CREATININE 1.04 05/24/2015   BUN 14 12/14/2014   CO2 25 03/09/2013   TSH 3.80 12/14/2014        Assessment & Plan:   Problem List Items Addressed This Visit    Gout - Primary    Pain, swelling and redness in left great toe as outlined.  Appears to be c/w gout.  Check uric acid level.  Treat with colchicine as directed.  Follow.  Discussed hctz.  She wants to continue for now.        Relevant Orders   Creatinine (Completed)   Uric acid (Completed)       Einar Pheasant, MD

## 2015-05-24 NOTE — Telephone Encounter (Signed)
Patient would liek to be seen today for her left grand toe pain. She thinks that she may have gout. She stated that the she could not stand the sheets to touch her toe. Please advise, if she can not come, she stated that she could go to [facility]

## 2015-05-24 NOTE — Telephone Encounter (Signed)
Patient to be worked-in on today's schedule at 1:15 per Dr. Nicki Reaper.  Patient aware.

## 2015-05-25 ENCOUNTER — Encounter: Payer: Self-pay | Admitting: Internal Medicine

## 2015-05-26 ENCOUNTER — Encounter: Payer: Self-pay | Admitting: Internal Medicine

## 2015-05-26 DIAGNOSIS — M109 Gout, unspecified: Secondary | ICD-10-CM | POA: Insufficient documentation

## 2015-05-26 NOTE — Assessment & Plan Note (Addendum)
Pain, swelling and redness in left great toe as outlined.  Appears to be c/w gout.  Check uric acid level.  Treat with colchicine as directed.  Follow.  Discussed hctz.  She wants to continue for now.

## 2015-05-29 ENCOUNTER — Other Ambulatory Visit: Payer: Self-pay | Admitting: Internal Medicine

## 2015-05-29 DIAGNOSIS — E039 Hypothyroidism, unspecified: Secondary | ICD-10-CM | POA: Diagnosis not present

## 2015-05-29 DIAGNOSIS — E7800 Pure hypercholesterolemia, unspecified: Secondary | ICD-10-CM | POA: Diagnosis not present

## 2015-05-29 DIAGNOSIS — R7989 Other specified abnormal findings of blood chemistry: Secondary | ICD-10-CM | POA: Diagnosis not present

## 2015-05-29 DIAGNOSIS — I10 Essential (primary) hypertension: Secondary | ICD-10-CM | POA: Diagnosis not present

## 2015-05-30 LAB — CBC WITH DIFFERENTIAL/PLATELET
BASOS: 1 %
Basophils Absolute: 0 10*3/uL (ref 0.0–0.2)
EOS (ABSOLUTE): 0.1 10*3/uL (ref 0.0–0.4)
EOS: 2 %
HEMATOCRIT: 42.7 % (ref 34.0–46.6)
HEMOGLOBIN: 15.1 g/dL (ref 11.1–15.9)
Immature Grans (Abs): 0 10*3/uL (ref 0.0–0.1)
Immature Granulocytes: 0 %
LYMPHS ABS: 1.6 10*3/uL (ref 0.7–3.1)
Lymphs: 28 %
MCH: 30.8 pg (ref 26.6–33.0)
MCHC: 35.4 g/dL (ref 31.5–35.7)
MCV: 87 fL (ref 79–97)
MONOS ABS: 0.5 10*3/uL (ref 0.1–0.9)
Monocytes: 8 %
Neutrophils Absolute: 3.5 10*3/uL (ref 1.4–7.0)
Neutrophils: 61 %
Platelets: 245 10*3/uL (ref 150–379)
RBC: 4.91 x10E6/uL (ref 3.77–5.28)
RDW: 13.7 % (ref 12.3–15.4)
WBC: 5.8 10*3/uL (ref 3.4–10.8)

## 2015-05-30 LAB — LIPID PANEL W/O CHOL/HDL RATIO
Cholesterol, Total: 237 mg/dL — ABNORMAL HIGH (ref 100–199)
HDL: 36 mg/dL — AB (ref >39)
LDL CALC: 147 mg/dL — AB (ref 0–99)
Triglycerides: 270 mg/dL — ABNORMAL HIGH (ref 0–149)
VLDL Cholesterol Cal: 54 mg/dL — ABNORMAL HIGH (ref 5–40)

## 2015-05-30 LAB — HEPATIC FUNCTION PANEL
ALBUMIN: 4.4 g/dL (ref 3.5–5.5)
ALK PHOS: 106 IU/L (ref 39–117)
ALT: 87 IU/L — ABNORMAL HIGH (ref 0–32)
AST: 56 IU/L — ABNORMAL HIGH (ref 0–40)
Bilirubin Total: 0.2 mg/dL (ref 0.0–1.2)
Bilirubin, Direct: 0.09 mg/dL (ref 0.00–0.40)
TOTAL PROTEIN: 6.9 g/dL (ref 6.0–8.5)

## 2015-05-30 LAB — BASIC METABOLIC PANEL
BUN/Creatinine Ratio: 11 (ref 9–23)
BUN: 14 mg/dL (ref 6–24)
CALCIUM: 9.5 mg/dL (ref 8.7–10.2)
CO2: 25 mmol/L (ref 18–29)
CREATININE: 1.25 mg/dL — AB (ref 0.57–1.00)
Chloride: 99 mmol/L (ref 96–106)
GFR, EST AFRICAN AMERICAN: 54 mL/min/{1.73_m2} — AB (ref >59)
GFR, EST NON AFRICAN AMERICAN: 47 mL/min/{1.73_m2} — AB (ref >59)
Glucose: 101 mg/dL — ABNORMAL HIGH (ref 65–99)
POTASSIUM: 3.3 mmol/L — AB (ref 3.5–5.2)
SODIUM: 143 mmol/L (ref 134–144)

## 2015-06-05 ENCOUNTER — Encounter: Payer: Self-pay | Admitting: *Deleted

## 2015-06-05 ENCOUNTER — Other Ambulatory Visit: Payer: Self-pay | Admitting: Internal Medicine

## 2015-06-07 MED ORDER — ALLOPURINOL 100 MG PO TABS: 100.0000 mg | ORAL_TABLET | Freq: Every day | ORAL | Status: DC

## 2015-06-07 NOTE — Telephone Encounter (Signed)
rx sent in for allopurinol.  See my chart message.  Pt wants orders faxed to [facility] [facility].  She provided fax number.

## 2015-06-10 ENCOUNTER — Other Ambulatory Visit: Payer: Self-pay | Admitting: Internal Medicine

## 2015-06-10 DIAGNOSIS — R748 Abnormal levels of other serum enzymes: Secondary | ICD-10-CM | POA: Diagnosis not present

## 2015-06-11 LAB — URINALYSIS, ROUTINE W REFLEX MICROSCOPIC
BILIRUBIN UA: NEGATIVE
GLUCOSE, UA: NEGATIVE
Ketones, UA: NEGATIVE
LEUKOCYTES UA: NEGATIVE
Nitrite, UA: NEGATIVE
PH UA: 6 (ref 5.0–7.5)
PROTEIN UA: NEGATIVE
RBC UA: NEGATIVE
Specific Gravity, UA: 1.021 (ref 1.005–1.030)
UUROB: 0.2 mg/dL (ref 0.2–1.0)

## 2015-06-11 LAB — COMPREHENSIVE METABOLIC PANEL
A/G RATIO: 1.8 (ref 1.2–2.2)
ALK PHOS: 91 IU/L (ref 39–117)
ALT: 144 IU/L — AB (ref 0–32)
AST: 82 IU/L — AB (ref 0–40)
Albumin: 4.7 g/dL (ref 3.5–5.5)
BILIRUBIN TOTAL: 0.6 mg/dL (ref 0.0–1.2)
BUN/Creatinine Ratio: 13 (ref 9–23)
BUN: 14 mg/dL (ref 6–24)
CHLORIDE: 97 mmol/L (ref 96–106)
CO2: 23 mmol/L (ref 18–29)
Calcium: 10 mg/dL (ref 8.7–10.2)
Creatinine, Ser: 1.04 mg/dL — ABNORMAL HIGH (ref 0.57–1.00)
GFR calc Af Amer: 68 mL/min/{1.73_m2} (ref >59)
GFR, EST NON AFRICAN AMERICAN: 59 mL/min/{1.73_m2} — AB (ref >59)
Globulin, Total: 2.6 g/dL (ref 1.5–4.5)
Glucose: 100 mg/dL — ABNORMAL HIGH (ref 65–99)
POTASSIUM: 3.5 mmol/L (ref 3.5–5.2)
SODIUM: 142 mmol/L (ref 134–144)
Total Protein: 7.3 g/dL (ref 6.0–8.5)

## 2015-06-12 ENCOUNTER — Other Ambulatory Visit: Payer: 59

## 2015-06-26 ENCOUNTER — Encounter: Payer: Self-pay | Admitting: *Deleted

## 2015-06-26 DIAGNOSIS — K760 Fatty (change of) liver, not elsewhere classified: Secondary | ICD-10-CM

## 2015-06-26 DIAGNOSIS — R945 Abnormal results of liver function studies: Secondary | ICD-10-CM

## 2015-06-26 DIAGNOSIS — R7989 Other specified abnormal findings of blood chemistry: Secondary | ICD-10-CM

## 2015-07-03 NOTE — Telephone Encounter (Signed)
Order placed for GI referral.   

## 2015-08-13 DIAGNOSIS — R748 Abnormal levels of other serum enzymes: Secondary | ICD-10-CM | POA: Diagnosis not present

## 2015-08-15 DIAGNOSIS — D225 Melanocytic nevi of trunk: Secondary | ICD-10-CM | POA: Diagnosis not present

## 2015-08-15 DIAGNOSIS — L918 Other hypertrophic disorders of the skin: Secondary | ICD-10-CM | POA: Diagnosis not present

## 2015-08-15 DIAGNOSIS — L719 Rosacea, unspecified: Secondary | ICD-10-CM | POA: Diagnosis not present

## 2015-08-15 DIAGNOSIS — L821 Other seborrheic keratosis: Secondary | ICD-10-CM | POA: Diagnosis not present

## 2015-08-15 DIAGNOSIS — L578 Other skin changes due to chronic exposure to nonionizing radiation: Secondary | ICD-10-CM | POA: Diagnosis not present

## 2015-08-15 DIAGNOSIS — D229 Melanocytic nevi, unspecified: Secondary | ICD-10-CM | POA: Diagnosis not present

## 2015-08-15 DIAGNOSIS — Z1283 Encounter for screening for malignant neoplasm of skin: Secondary | ICD-10-CM | POA: Diagnosis not present

## 2015-08-15 DIAGNOSIS — D485 Neoplasm of uncertain behavior of skin: Secondary | ICD-10-CM | POA: Diagnosis not present

## 2015-08-15 DIAGNOSIS — L570 Actinic keratosis: Secondary | ICD-10-CM | POA: Diagnosis not present

## 2015-08-15 DIAGNOSIS — L812 Freckles: Secondary | ICD-10-CM | POA: Diagnosis not present

## 2015-08-22 ENCOUNTER — Encounter (HOSPITAL_COMMUNITY)
Admission: RE | Admit: 2015-08-22 | Discharge: 2015-08-22 | Disposition: A | Discharge status: Home / Self Care | Payer: 59 | Source: Ambulatory Visit | Attending: General Surgery | Admitting: General Surgery

## 2015-08-22 ENCOUNTER — Encounter (HOSPITAL_COMMUNITY): Payer: Self-pay

## 2015-08-22 DIAGNOSIS — R9431 Abnormal electrocardiogram [ECG] [EKG]: Secondary | ICD-10-CM | POA: Insufficient documentation

## 2015-08-22 DIAGNOSIS — Z7982 Long term (current) use of aspirin: Secondary | ICD-10-CM | POA: Diagnosis not present

## 2015-08-22 DIAGNOSIS — Z01812 Encounter for preprocedural laboratory examination: Secondary | ICD-10-CM | POA: Insufficient documentation

## 2015-08-22 DIAGNOSIS — Z79899 Other long term (current) drug therapy: Secondary | ICD-10-CM | POA: Diagnosis not present

## 2015-08-22 DIAGNOSIS — I10 Essential (primary) hypertension: Secondary | ICD-10-CM | POA: Diagnosis not present

## 2015-08-22 DIAGNOSIS — R2241 Localized swelling, mass and lump, right lower limb: Secondary | ICD-10-CM | POA: Insufficient documentation

## 2015-08-22 DIAGNOSIS — K219 Gastro-esophageal reflux disease without esophagitis: Secondary | ICD-10-CM | POA: Insufficient documentation

## 2015-08-22 DIAGNOSIS — E785 Hyperlipidemia, unspecified: Secondary | ICD-10-CM | POA: Insufficient documentation

## 2015-08-22 DIAGNOSIS — Z01818 Encounter for other preprocedural examination: Secondary | ICD-10-CM | POA: Insufficient documentation

## 2015-08-22 DIAGNOSIS — E039 Hypothyroidism, unspecified: Secondary | ICD-10-CM | POA: Insufficient documentation

## 2015-08-22 HISTORY — DX: Cardiac murmur, unspecified: R01.1

## 2015-08-22 HISTORY — DX: Presence of spectacles and contact lenses: Z97.3

## 2015-08-22 HISTORY — DX: Fatty (change of) liver, not elsewhere classified: K76.0

## 2015-08-22 LAB — CBC
HEMATOCRIT: 40 % (ref 36.0–46.0)
Hemoglobin: 13.6 g/dL (ref 12.0–15.0)
MCH: 30 pg (ref 26.0–34.0)
MCHC: 34 g/dL (ref 30.0–36.0)
MCV: 88.3 fL (ref 78.0–100.0)
PLATELETS: 209 10*3/uL (ref 150–400)
RBC: 4.53 MIL/uL (ref 3.87–5.11)
RDW: 13.8 % (ref 11.5–15.5)
WBC: 7.1 10*3/uL (ref 4.0–10.5)

## 2015-08-22 LAB — COMPREHENSIVE METABOLIC PANEL
ALT: 83 U/L — AB (ref 14–54)
AST: 60 U/L — AB (ref 15–41)
Albumin: 4.2 g/dL (ref 3.5–5.0)
Alkaline Phosphatase: 82 U/L (ref 38–126)
Anion gap: 8 (ref 5–15)
BUN: 13 mg/dL (ref 6–20)
CHLORIDE: 102 mmol/L (ref 101–111)
CO2: 29 mmol/L (ref 22–32)
CREATININE: 1.14 mg/dL — AB (ref 0.44–1.00)
Calcium: 9.6 mg/dL (ref 8.9–10.3)
GFR calc Af Amer: 59 mL/min — ABNORMAL LOW (ref >60)
GFR, EST NON AFRICAN AMERICAN: 51 mL/min — AB (ref >60)
Glucose, Bld: 131 mg/dL — ABNORMAL HIGH (ref 65–99)
POTASSIUM: 3.2 mmol/L — AB (ref 3.5–5.1)
SODIUM: 139 mmol/L (ref 135–145)
Total Bilirubin: 0.6 mg/dL (ref 0.3–1.2)
Total Protein: 6.7 g/dL (ref 6.5–8.1)

## 2015-08-22 NOTE — Progress Notes (Signed)
PCP - Dr. Einar Pheasant Cardiologist - Dr. Serafina Royals but has not needed to see in several years Gastroenterologist - Dewayne Hatch at [facility]  EKG - 08/22/15 CXR - denies  Echo - 2013 Stress test - 2008 Cardiac Cath - denies  Patient denies chest pain and shortness of breath at PAT appointment.  Patient states that she has recently been diagnosed with a fatty liver and is being followed by Laurine Blazer at [facility].  Requested recent labs due to elevated liver enzymes.

## 2015-08-22 NOTE — Pre-Procedure Instructions (Signed)
Marie Colon  08/22/2015      [address], [place] [address] Phone: [phone] Fax: [phone]    Your procedure is scheduled on Thursday, August 3rd, 2017  Report to [facility] Admitting at 5:30 A.M.   Call this number if you have problems the morning of surgery:  [phone]   Remember:  Do not eat food or drink liquids after midnight.   Take these medicines the morning of surgery with A SIP OF WATER: Allopurinol (Zyloprim), Alprazolam (Xanax) if needed, Colchicine if needed, Lansoprazole (Prevacid), Levothyroxine (Synthroid), Metoprolol (Lopressor).  Stop taking aspirin, BC's, Goody's, Herbal medications, Fish Oil, Aleve, Ibuprofen, Advil, Motrin, Vitamins   Do not wear jewelry, make-up or nail polish.  Do not wear lotions, powders, or perfumes.  You may NOT wear deoderant.  Do not shave 48 hours prior to surgery.    Do not bring valuables to the hospital.  [facility] is not responsible for any belongings or valuables.  Contacts, dentures or bridgework may not be worn into surgery.  Leave your suitcase in the car.  After surgery it may be brought to your room.  For patients admitted to the hospital, discharge time will be determined by your treatment team.  Patients discharged the day of surgery will not be allowed to drive home.    Special instructions:  [place] - Preparing for Surgery  Before surgery, you can play an important role.  Because skin is not sterile, your skin needs to be as free of germs as possible.  You can reduce the number of germs on you skin by washing with CHG (chlorahexidine gluconate) soap before surgery.  CHG is an antiseptic cleaner which kills germs and bonds with the skin to continue killing germs even after washing.  Please DO NOT use if you have an allergy to CHG or antibacterial soaps.  If your skin becomes reddened/irritated  stop using the CHG and inform your nurse when you arrive at Short Stay.  Do not shave (including legs and underarms) for at least 48 hours prior to the first CHG shower.  You may shave your face.  Please follow these instructions carefully:   1.  Shower with CHG Soap the night before surgery and the  morning of Surgery.  2.  If you choose to wash your hair, wash your hair first as usual with your  normal shampoo.  3.  After you shampoo, rinse your hair and body thoroughly to remove the  Shampoo.  4.  Use CHG as you would any other liquid soap.  You can apply chg directly to the skin and wash gently with scrungie or a clean washcloth.  5.  Apply the CHG Soap to your body ONLY FROM THE NECK DOWN.     Do not use on open wounds or open sores.  Avoid contact with your eyes,  ears, mouth and genitals (private parts).  Wash genitals (private parts) with your normal soap.  6.  Wash thoroughly, paying special attention to the area where your surgery  will be performed.  7.  Thoroughly rinse your body with warm water from the neck down.  8.  DO NOT shower/wash with your normal soap after using and rinsing off   the CHG Soap.  9.  Pat yourself dry with a clean towel.            10.  Wear clean pajamas.            11.  Place clean sheets on your bed the night of your first shower and do not  sleep with pets.  Day of Surgery  Do not apply any lotions/deoderants the morning of surgery.  Please wear clean clothes to the hospital/surgery center.     Please read over the following fact sheets that you were given.

## 2015-08-26 NOTE — Progress Notes (Signed)
Anesthesia Chart Review:  Pt is a 60 year old female scheduled for excision of R thigh subfascial mass on 08/29/2015 with Stark Klein, MD.   PMH includes:  HTN, hyperlipidemia, fatty liver, pituitary macroadenoma (s/p removal 2006), hypothyroidism, post-op N/V, GERD. Never smoker. BMI 32.5  Medications include: ASA, prevacid, levothyroxine, metoprolol, pravastatin, triameterne-hctz.   Preoperative labs reviewed.  AST 60, ALT 83; this is consistent with prior results (Epic and care everywhere); pt has fatty liver.   EKG 08/22/15: NSR. Low voltage QRS. Nonspecific ST and T wave abnormality  Exercise stress test 07/29/11:  1. Normal treadmill ECG without evidence ischemia or dysrhythmia 2. Excellent exercise tolerance for age 67. Normal stress echo images without evidence of ischemia.   If no changes, I anticipate pt can proceed with surgery as scheduled.   Willeen Cass, FNP-BC [facility]/Anesthesiology Phone: [phone] 08/26/2015 1:22 PM

## 2015-08-27 NOTE — H&P (Signed)
Marie Colon Location: [place] Office Patient #: [name] DOB: [birth year] Married / Language: English / Race: White Female   History of Present Illness  Patient words: lipoma.  The patient is a 60 year old female who presents with a complaint of Mass. Patient is a 60 yo F referred by Dr. Einar Pheasant for consultation regarding a right upper thigh mass. This has been present around 1-2 years, but has been enlarging. It is asymptomatic. 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 She had one on the left as well, but it was much smaller. There were not concerning imaging features. She is asymptomatic in terms of pain, but is bothered by the mass. It makes it difficult to fit her clothes.   Imaging was reviewed. mass was around 12 x 7 cm.    Other Problems  Gastroesophageal Reflux Disease Heart murmur High blood pressure Hypercholesterolemia Thyroid Disease Transfusion history Ulcerative Colitis  Past Surgical History Appendectomy Gallbladder Surgery - Laparoscopic Hysterectomy (not due to cancer) - Complete  Diagnostic Studies History  Colonoscopy 1-5 years ago Mammogram within last year Pap Smear 1-5 years ago  Allergies  Penicillins Protonix *ULCER DRUGS*  Medication History Cabergoline (0.5MG  Tablet, Oral) Active. Lansoprazole (30MG  Capsule DR, Oral) Active. Triamterene-HCTZ (37.5-25MG  Capsule, Oral) Active. Levothyroxine Sodium (100MCG Tablet, Oral) Active. Metoprolol Tartrate (50MG  Tablet, Oral) Active. Pravastatin Sodium (40MG  Tablet, Oral) Active. Aspirin (81MG  Tablet Chewable, Oral) Active. Probiotic (Oral) Active. Stool Softener (100MG  Capsule, Oral) Active. Multiple Vitamin (Oral) Active.  Social History  Alcohol use Occasional alcohol use. Caffeine use Carbonated beverages, Coffee, Tea. No drug use Tobacco use Never smoker.  Family History Alcohol Abuse Family Members In General. Arthritis  Mother. Cerebrovascular Accident Father. Colon Polyps Mother. Diabetes Mellitus Family Members In General, Mother. Heart Disease Mother, Sister. Heart disease in female family member before age 58 Hypertension Mother, Sister. Kidney Disease Mother. Respiratory Condition Mother. Thyroid problems Sister.  Pregnancy / Birth History Age at menarche 72 years. Age of menopause 83-50 Contraceptive History Oral contraceptives. Gravida 3 Maternal age 69-25 Para 2    Review of Systems General Not Present- Appetite Loss, Chills, Fatigue, Fever, Night Sweats, Weight Gain and Weight Loss. Skin Not Present- Change in Wart/Mole, Dryness, Hives, Jaundice, New Lesions, Non-Healing Wounds, Rash and Ulcer. HEENT Present- Seasonal Allergies and Wears glasses/contact lenses. Not Present- Earache, Hearing Loss, Hoarseness, Nose Bleed, Oral Ulcers, Ringing in the Ears, Sinus Pain, Sore Throat, Visual Disturbances and Yellow Eyes. Respiratory Present- Snoring. Not Present- Bloody sputum, Chronic Cough, Difficulty Breathing and Wheezing. Breast Not Present- Breast Mass, Breast Pain, Nipple Discharge and Skin Changes. Cardiovascular Present- Leg Cramps. Not Present- Chest Pain, Difficulty Breathing Lying Down, Palpitations, Rapid Heart Rate, Shortness of Breath and Swelling of Extremities. Gastrointestinal Present- Change in Bowel Habits and Indigestion. Not Present- Abdominal Pain, Bloating, Bloody Stool, Chronic diarrhea, Constipation, Difficulty Swallowing, Excessive gas, Gets full quickly at meals, Hemorrhoids, Nausea, Rectal Pain and Vomiting. Female Genitourinary Not Present- Frequency, Nocturia, Painful Urination, Pelvic Pain and Urgency. Musculoskeletal Present- Joint Stiffness. Not Present- Back Pain, Joint Pain, Muscle Pain, Muscle Weakness and Swelling of Extremities. Neurological Not Present- Decreased Memory, Fainting, Headaches, Numbness, Seizures, Tingling, Tremor, Trouble walking  and Weakness. Psychiatric Not Present- Anxiety, Bipolar, Change in Sleep Pattern, Depression, Fearful and Frequent crying. Endocrine Not Present- Cold Intolerance, Excessive Hunger, Hair Changes, Heat Intolerance, Hot flashes and New Diabetes. Hematology Not Present- Easy Bruising, Excessive bleeding, Gland problems, HIV and Persistent Infections.  Vitals  Weight: 176.2 lb Height: 61.5in Body  Surface Area: 1.8 m Body Mass Index: 32.75 kg/m  Temp.: 97.6F(Oral)  Pulse: 82 (Regular)  BP: 130/80 (Sitting, Left Arm, Standard)       Physical Exam General Mental Status-Alert. General Appearance-Consistent with stated age. Hydration-Well hydrated. Voice-Normal.  Integumentary Note: large mobile soft mass on right hip just above greater trochanter.   Head and Neck Head-normocephalic, atraumatic with no lesions or palpable masses. Trachea-midline. Thyroid Gland Characteristics - normal size and consistency.  Eye Eyeball - Bilateral-Extraocular movements intact. Sclera/Conjunctiva - Bilateral-No scleral icterus.  Chest and Lung Exam Chest and lung exam reveals -quiet, even and easy respiratory effort with no use of accessory muscles and on auscultation, normal breath sounds, no adventitious sounds and normal vocal resonance. Inspection Chest Wall - Normal. Back - normal.  Cardiovascular Cardiovascular examination reveals -normal heart sounds, regular rate and rhythm with no murmurs and normal pedal pulses bilaterally.  Abdomen Inspection Inspection of the abdomen reveals - No Hernias. Palpation/Percussion Palpation and Percussion of the abdomen reveal - Soft, Non Tender, No Rebound tenderness, No Rigidity (guarding) and No hepatosplenomegaly. Auscultation Auscultation of the abdomen reveals - Bowel sounds normal.  Neurologic Neurologic evaluation reveals -alert and oriented x 3 with no impairment of recent or remote memory. Mental  Status-Normal.  Musculoskeletal Global Assessment -Note: no gross deformities.  Normal Exam - Left-Upper Extremity Strength Normal and Lower Extremity Strength Normal. Normal Exam - Right-Upper Extremity Strength Normal and Lower Extremity Strength Normal.  Lymphatic Head & Neck  General Head & Neck Lymphatics: Bilateral - Description - Normal. Axillary  General Axillary Region: Bilateral - Description - Normal. Tenderness - Non Tender. Femoral & Inguinal  Generalized Femoral & Inguinal Lymphatics: Bilateral - Description - No Generalized lymphadenopathy.    Assessment & Plan  MASS OF RIGHT THIGH (R22.41) Impression: Patient has mass in right tensor fascia lata. This muscle is expendable. Given that the mass has been enlarging, we will excise it most likely with the proximal portion of the muscle. This should not alter her strength or mobility.  Her biggest risk is bleeding. She has a small risk of weakness on that side.  The imaging characteristics and exam are consistent with lipoma rather than sarcoma.  i reviewed that the risks include bleeding, infection, damage to adjacent structures, permanent numbness on a portion of her thigh, wound dissatisfaction/divot, blood clot, heart or lung problems, or other unanticipated issues.  She will need to be out of work and doing no strenuous activity for around 2 weeks.  We will proceed at the first mutually available time. Current Plans Schedule for Surgery Pt Education - [facility] Free Text Education/Instructions: discussed with patient and provided information.

## 2015-08-29 ENCOUNTER — Ambulatory Visit (HOSPITAL_COMMUNITY): Payer: 59 | Admitting: Certified Registered Nurse Anesthetist

## 2015-08-29 ENCOUNTER — Ambulatory Visit (HOSPITAL_COMMUNITY)
Admission: RE | Admit: 2015-08-29 | Discharge: 2015-08-29 | Disposition: A | Discharge status: Home / Self Care | Payer: 59 | Source: Ambulatory Visit | Attending: General Surgery | Admitting: General Surgery

## 2015-08-29 ENCOUNTER — Ambulatory Visit (HOSPITAL_COMMUNITY): Payer: 59 | Admitting: Emergency Medicine

## 2015-08-29 ENCOUNTER — Encounter (HOSPITAL_COMMUNITY)
Admission: RE | Disposition: A | Discharge status: Home / Self Care | Payer: Self-pay | Source: Ambulatory Visit | Attending: General Surgery

## 2015-08-29 ENCOUNTER — Encounter (HOSPITAL_COMMUNITY): Payer: Self-pay | Admitting: *Deleted

## 2015-08-29 DIAGNOSIS — K219 Gastro-esophageal reflux disease without esophagitis: Secondary | ICD-10-CM | POA: Insufficient documentation

## 2015-08-29 DIAGNOSIS — E7800 Pure hypercholesterolemia, unspecified: Secondary | ICD-10-CM | POA: Insufficient documentation

## 2015-08-29 DIAGNOSIS — D1723 Benign lipomatous neoplasm of skin and subcutaneous tissue of right leg: Secondary | ICD-10-CM | POA: Diagnosis not present

## 2015-08-29 DIAGNOSIS — D179 Benign lipomatous neoplasm, unspecified: Secondary | ICD-10-CM | POA: Diagnosis not present

## 2015-08-29 DIAGNOSIS — I10 Essential (primary) hypertension: Secondary | ICD-10-CM | POA: Insufficient documentation

## 2015-08-29 DIAGNOSIS — K5190 Ulcerative colitis, unspecified, without complications: Secondary | ICD-10-CM | POA: Diagnosis not present

## 2015-08-29 DIAGNOSIS — E039 Hypothyroidism, unspecified: Secondary | ICD-10-CM | POA: Diagnosis not present

## 2015-08-29 DIAGNOSIS — Z880 Allergy status to penicillin: Secondary | ICD-10-CM | POA: Diagnosis not present

## 2015-08-29 DIAGNOSIS — R2241 Localized swelling, mass and lump, right lower limb: Secondary | ICD-10-CM | POA: Diagnosis not present

## 2015-08-29 DIAGNOSIS — Z888 Allergy status to other drugs, medicaments and biological substances status: Secondary | ICD-10-CM | POA: Insufficient documentation

## 2015-08-29 DIAGNOSIS — D1739 Benign lipomatous neoplasm of skin and subcutaneous tissue of other sites: Secondary | ICD-10-CM | POA: Diagnosis not present

## 2015-08-29 DIAGNOSIS — R011 Cardiac murmur, unspecified: Secondary | ICD-10-CM | POA: Diagnosis not present

## 2015-08-29 HISTORY — PX: MASS EXCISION: SHX2000

## 2015-08-29 SURGERY — EXCISION MASS
Anesthesia: General | Site: Thigh | Laterality: Right

## 2015-08-29 MED ORDER — BUPIVACAINE LIPOSOME 1.3 % IJ SUSP
20.0000 mL | INTRAMUSCULAR | Status: AC
  Administered 2015-08-29: 20 mL
  Filled 2015-08-29: qty 20

## 2015-08-29 MED ORDER — PROPOFOL 10 MG/ML IV BOLUS
INTRAVENOUS | Status: AC
  Filled 2015-08-29: qty 20

## 2015-08-29 MED ORDER — LACTATED RINGERS IV SOLN
INTRAVENOUS | Status: DC | PRN
  Administered 2015-08-29: 08:00:00 via INTRAVENOUS

## 2015-08-29 MED ORDER — MIDAZOLAM HCL 5 MG/5ML IJ SOLN
INTRAMUSCULAR | Status: DC | PRN
  Administered 2015-08-29: 2 mg via INTRAVENOUS

## 2015-08-29 MED ORDER — DEXAMETHASONE SODIUM PHOSPHATE 10 MG/ML IJ SOLN
INTRAMUSCULAR | Status: AC
  Filled 2015-08-29: qty 1

## 2015-08-29 MED ORDER — EPHEDRINE SULFATE 50 MG/ML IJ SOLN
INTRAMUSCULAR | Status: DC | PRN
  Administered 2015-08-29 (×2): 5 mg via INTRAVENOUS
  Administered 2015-08-29: 10 mg via INTRAVENOUS
  Administered 2015-08-29: 5 mg via INTRAVENOUS

## 2015-08-29 MED ORDER — FENTANYL CITRATE (PF) 250 MCG/5ML IJ SOLN
INTRAMUSCULAR | Status: AC
  Filled 2015-08-29: qty 5

## 2015-08-29 MED ORDER — ALBUTEROL SULFATE HFA 108 (90 BASE) MCG/ACT IN AERS
INHALATION_SPRAY | RESPIRATORY_TRACT | Status: DC | PRN
  Administered 2015-08-29: 2 via RESPIRATORY_TRACT

## 2015-08-29 MED ORDER — CIPROFLOXACIN IN D5W 400 MG/200ML IV SOLN
400.0000 mg | INTRAVENOUS | Status: AC
  Administered 2015-08-29: 400 mg via INTRAVENOUS
  Filled 2015-08-29: qty 200

## 2015-08-29 MED ORDER — PROPOFOL 10 MG/ML IV BOLUS
INTRAVENOUS | Status: DC | PRN
  Administered 2015-08-29: 170 mg via INTRAVENOUS

## 2015-08-29 MED ORDER — LIDOCAINE HCL (PF) 1 % IJ SOLN
INTRAMUSCULAR | Status: AC
  Filled 2015-08-29: qty 30

## 2015-08-29 MED ORDER — MIDAZOLAM HCL 2 MG/2ML IJ SOLN
INTRAMUSCULAR | Status: AC
  Filled 2015-08-29: qty 2

## 2015-08-29 MED ORDER — FENTANYL CITRATE (PF) 100 MCG/2ML IJ SOLN
INTRAMUSCULAR | Status: DC | PRN
  Administered 2015-08-29 (×2): 50 ug via INTRAVENOUS

## 2015-08-29 MED ORDER — HYDROMORPHONE HCL 1 MG/ML IJ SOLN: 0.2500 mg | INTRAMUSCULAR | Status: DC | PRN

## 2015-08-29 MED ORDER — SODIUM CHLORIDE 0.9 % IJ SOLN
INTRAMUSCULAR | Status: DC | PRN
  Administered 2015-08-29: 20 mL

## 2015-08-29 MED ORDER — BUPIVACAINE-EPINEPHRINE (PF) 0.25% -1:200000 IJ SOLN
INTRAMUSCULAR | Status: DC | PRN
  Administered 2015-08-29: 12 mL via INTRAMUSCULAR

## 2015-08-29 MED ORDER — 0.9 % SODIUM CHLORIDE (POUR BTL) OPTIME
TOPICAL | Status: DC | PRN
  Administered 2015-08-29: 1000 mL

## 2015-08-29 MED ORDER — LIDOCAINE HCL (CARDIAC) 20 MG/ML IV SOLN
INTRAVENOUS | Status: DC | PRN
  Administered 2015-08-29: 100 mg via INTRAVENOUS

## 2015-08-29 MED ORDER — ONDANSETRON HCL 4 MG/2ML IJ SOLN
INTRAMUSCULAR | Status: DC | PRN
  Administered 2015-08-29: 4 mg via INTRAVENOUS

## 2015-08-29 MED ORDER — ONDANSETRON HCL 4 MG/2ML IJ SOLN
INTRAMUSCULAR | Status: AC
  Filled 2015-08-29: qty 4

## 2015-08-29 MED ORDER — DEXAMETHASONE SODIUM PHOSPHATE 10 MG/ML IJ SOLN
INTRAMUSCULAR | Status: DC | PRN
  Administered 2015-08-29: 10 mg via INTRAVENOUS

## 2015-08-29 MED ORDER — BUPIVACAINE-EPINEPHRINE (PF) 0.25% -1:200000 IJ SOLN
INTRAMUSCULAR | Status: AC
  Filled 2015-08-29: qty 30

## 2015-08-29 MED ORDER — OXYCODONE HCL 5 MG PO TABS: 5.0000 mg | ORAL_TABLET | Freq: Four times a day (QID) | ORAL | 0 refills | Status: DC | PRN

## 2015-08-29 MED ORDER — PHENYLEPHRINE HCL 10 MG/ML IJ SOLN
INTRAMUSCULAR | Status: DC | PRN
  Administered 2015-08-29 (×2): 80 ug via INTRAVENOUS

## 2015-08-29 SURGICAL SUPPLY — 57 items
BENZOIN TINCTURE PRP APPL 2/3 (GAUZE/BANDAGES/DRESSINGS) ×2 IMPLANT
BLADE SURG 10 STRL SS (BLADE) ×2 IMPLANT
BLADE SURG 15 STRL LF DISP TIS (BLADE) ×1 IMPLANT
BLADE SURG 15 STRL SS (BLADE) ×1
CANISTER SUCTION 2500CC (MISCELLANEOUS) ×2 IMPLANT
CHLORAPREP W/TINT 26ML (MISCELLANEOUS) ×2 IMPLANT
COVER SURGICAL LIGHT HANDLE (MISCELLANEOUS) ×2 IMPLANT
DRAPE LAPAROSCOPIC ABDOMINAL (DRAPES) IMPLANT
DRAPE LAPAROTOMY T 98X78 PEDS (DRAPES) ×2 IMPLANT
DRAPE UTILITY XL STRL (DRAPES) ×4 IMPLANT
DRSG TEGADERM 4X4.75 (GAUZE/BANDAGES/DRESSINGS) ×2 IMPLANT
ELECT CAUTERY BLADE 6.4 (BLADE) ×2 IMPLANT
ELECT REM PT RETURN 9FT ADLT (ELECTROSURGICAL) ×2
ELECTRODE REM PT RTRN 9FT ADLT (ELECTROSURGICAL) ×1 IMPLANT
GAUZE SPONGE 4X4 12PLY STRL (GAUZE/BANDAGES/DRESSINGS) ×2 IMPLANT
GAUZE SPONGE 4X4 16PLY XRAY LF (GAUZE/BANDAGES/DRESSINGS) ×2 IMPLANT
GLOVE BIO SURGEON STRL SZ 6 (GLOVE) ×2 IMPLANT
GLOVE BIO SURGEON STRL SZ8 (GLOVE) ×2 IMPLANT
GLOVE BIOGEL PI IND STRL 6 (GLOVE) ×1 IMPLANT
GLOVE BIOGEL PI IND STRL 6.5 (GLOVE) ×2 IMPLANT
GLOVE BIOGEL PI IND STRL 8.5 (GLOVE) ×1 IMPLANT
GLOVE BIOGEL PI INDICATOR 6 (GLOVE) ×1
GLOVE BIOGEL PI INDICATOR 6.5 (GLOVE) ×2
GLOVE BIOGEL PI INDICATOR 8.5 (GLOVE) ×1
GOWN STRL REUS W/ TWL LRG LVL3 (GOWN DISPOSABLE) ×1 IMPLANT
GOWN STRL REUS W/ TWL XL LVL3 (GOWN DISPOSABLE) ×1 IMPLANT
GOWN STRL REUS W/TWL 2XL LVL3 (GOWN DISPOSABLE) ×2 IMPLANT
GOWN STRL REUS W/TWL LRG LVL3 (GOWN DISPOSABLE) ×1
GOWN STRL REUS W/TWL XL LVL3 (GOWN DISPOSABLE) ×1
KIT BASIN OR (CUSTOM PROCEDURE TRAY) ×2 IMPLANT
KIT ROOM TURNOVER OR (KITS) ×2 IMPLANT
LIQUID BAND (GAUZE/BANDAGES/DRESSINGS) IMPLANT
NEEDLE 18GX1X1/2 (RX/OR ONLY) (NEEDLE) ×2 IMPLANT
NEEDLE HYPO 25GX1X1/2 BEV (NEEDLE) ×4 IMPLANT
NS IRRIG 1000ML POUR BTL (IV SOLUTION) ×2 IMPLANT
PACK SURGICAL SETUP 50X90 (CUSTOM PROCEDURE TRAY) ×2 IMPLANT
PAD ARMBOARD 7.5X6 YLW CONV (MISCELLANEOUS) ×4 IMPLANT
PENCIL BUTTON HOLSTER BLD 10FT (ELECTRODE) ×2 IMPLANT
SHEARS HARMONIC 9CM CVD (BLADE) ×2 IMPLANT
SPECIMEN JAR SMALL (MISCELLANEOUS) ×2 IMPLANT
SPONGE LAP 18X18 X RAY DECT (DISPOSABLE) ×2 IMPLANT
SPONGE LAP 4X18 X RAY DECT (DISPOSABLE) IMPLANT
STRIP CLOSURE SKIN 1/2X4 (GAUZE/BANDAGES/DRESSINGS) ×2 IMPLANT
SUT MON AB 4-0 PC3 18 (SUTURE) ×2 IMPLANT
SUT SILK 2 0 FS (SUTURE) ×2 IMPLANT
SUT VIC AB 2-0 CT1 18 (SUTURE) ×2 IMPLANT
SUT VIC AB 3-0 SH 27 (SUTURE) ×1
SUT VIC AB 3-0 SH 27X BRD (SUTURE) ×1 IMPLANT
SUT VIC AB 3-0 SH 8-18 (SUTURE) ×4 IMPLANT
SYR 30ML LL (SYRINGE) ×2 IMPLANT
SYR BULB 3OZ (MISCELLANEOUS) ×2 IMPLANT
SYR CONTROL 10ML LL (SYRINGE) ×4 IMPLANT
TAPE STRIPS DRAPE STRL (GAUZE/BANDAGES/DRESSINGS) ×2 IMPLANT
TOWEL OR 17X24 6PK STRL BLUE (TOWEL DISPOSABLE) ×2 IMPLANT
TOWEL OR 17X26 10 PK STRL BLUE (TOWEL DISPOSABLE) ×2 IMPLANT
TUBE CONNECTING 12X1/4 (SUCTIONS) ×2 IMPLANT
YANKAUER SUCT BULB TIP NO VENT (SUCTIONS) ×2 IMPLANT

## 2015-08-29 NOTE — Op Note (Signed)
PRE-OPERATIVE DIAGNOSIS: right thigh mass, intramuscular  POST-OPERATIVE DIAGNOSIS:  Same  PROCEDURE:  Procedure(s): Resection of intramuscular (subfascial) mass right thigh, 20 cm  SURGEON:  Surgeon(s): Stark Klein, MD  ANESTHESIA:   local and general  DRAINS: none   LOCAL MEDICATIONS USED:  MARCAINE   , LIDOCAINE  and OTHER exparel  SPECIMEN:  Source of Specimen:  right thigh mass  DISPOSITION OF SPECIMEN:  PATHOLOGY  COUNTS:  YES  DICTATION: .Dragon Dictation  PLAN OF CARE: Discharge to home after PACU  PATIENT DISPOSITION:  PACU - hemodynamically stable.  FINDINGS:  Right tensor fascia lata with fatty mass distributed throughout proximal muscle belly  EBL: min  PROCEDURE:    Patient was identified in all area and taken operating room where she was placed supine on operating room table. General anesthesia was induced. Her right thigh was performed. The patient's thigh was prepped and draped in sterile fashion. A timeout was performed according to the surgical safety checklist. When all was correct, we continued.  An oblique incision was made on the upper lateral right thigh. The subcutaneous tissues were divided with the cautery. Rake retractors were used to assist with creation of flaps. The fatty portion of the tensor fascia lata was identified. This was resected with a combination of cautery and the harmonic scalpel.   There was an additional bit of fatty tissue that was resected. The wound was then closed in layers. This closed the empty space down so I did not leave a drain. The skin was then reapproximated with 4-0 Monocryl in running subcutaneous 4 fashion. Spleen, dried, and dressed with Steri-Strips and gauze and Tegaderm. The patient was allowed to emerge from anesthesia and taken to the PACU in stable condition. Needle, sponge, and instrument counts were correct 2.

## 2015-08-29 NOTE — Anesthesia Procedure Notes (Deleted)
Date/Time: 08/29/2015 7:47 AM Performed by: Oletta Lamas

## 2015-08-29 NOTE — Transfer of Care (Signed)
Immediate Anesthesia Transfer of Care Note  Patient: Marie Colon  Procedure(s) Performed: Procedure(s): EXCISION OF RIGHT THIGH MASS SUBFASICAL 15 CM (Right)  Patient Location: PACU  Anesthesia Type:General  Level of Consciousness: awake, alert  and oriented  Airway & Oxygen Therapy: Patient Spontanous Breathing and Patient connected to nasal cannula oxygen  Post-op Assessment: Report given to RN and Post -op Vital signs reviewed and stable  Post vital signs: Reviewed and stable  Last Vitals:  Vitals:   08/29/15 0548  BP: 139/66  Pulse: 62  Resp: 18  Temp: 36.8 C   0925 HR 69, RR 15, Sats 93% BP 114/65 Last Pain:  Vitals:   08/29/15 0548  TempSrc: Oral      Patients Stated Pain Goal: 5 ([id number])  Complications: No apparent anesthesia complications

## 2015-08-29 NOTE — Interval H&P Note (Signed)
History and Physical Interval Note:  08/29/2015 7:27 AM  Marie Colon  has presented today for surgery, with the diagnosis of right thigh mass 15 cm   The various methods of treatment have been discussed with the patient and family. After consideration of risks, benefits and other options for treatment, the patient has consented to  Procedure(s): EXCISION OF RIGHT THIGH MASS SUBFASICAL 15 CM (Right) as a surgical intervention .  The patient's history has been reviewed, patient examined, no change in status, stable for surgery.  I have reviewed the patient's chart and labs.  Questions were answered to the patient's satisfaction.     Daelin Haste

## 2015-08-29 NOTE — Anesthesia Procedure Notes (Signed)
Procedure Name: LMA Insertion Date/Time: 08/29/2015 7:45 AM Performed by: Oletta Lamas Pre-anesthesia Checklist: Patient identified, Emergency Drugs available, Suction available and Patient being monitored Patient Re-evaluated:Patient Re-evaluated prior to inductionOxygen Delivery Method: Circle System Utilized Preoxygenation: Pre-oxygenation with 100% oxygen Intubation Type: IV induction Ventilation: Mask ventilation without difficulty LMA: LMA inserted LMA Size: 4.0 Number of attempts: 1 Airway Equipment and Method: Bite block Placement Confirmation: positive ETCO2 Tube secured with: Tape Dental Injury: Teeth and Oropharynx as per pre-operative assessment

## 2015-08-29 NOTE — Discharge Instructions (Addendum)
[place] Office Phone Number [phone]   POST OP INSTRUCTIONS  Always review your discharge instruction sheet given to you by the facility where your surgery was performed.  IF YOU HAVE DISABILITY OR FAMILY LEAVE FORMS, YOU MUST BRING THEM TO THE OFFICE FOR PROCESSING.  DO NOT GIVE THEM TO YOUR DOCTOR.  1. A prescription for pain medication may be given to you upon discharge.  Take your pain medication as prescribed, if needed.  If narcotic pain medicine is not needed, then you may take acetaminophen (Tylenol) or ibuprofen (Advil) as needed. 2. Take your usually prescribed medications unless otherwise directed 3. If you need a refill on your pain medication, please contact your pharmacy.  They will contact our office to request authorization.  Prescriptions will not be filled after 5pm or on week-ends. 4. You should eat very light the first 24 hours after surgery, such as soup, crackers, pudding, etc.  Resume your normal diet the day after surgery 5. It is common to experience some constipation if taking pain medication after surgery.  Increasing fluid intake and taking a stool softener will usually help or prevent this problem from occurring.  A mild laxative (Milk of Magnesia or Miralax) should be taken according to package directions if there are no bowel movements after 48 hours. 6. You may shower in 48 hours.  The surgical glue will flake off in 2-3 weeks.   7. ACTIVITIES:  No strenuous activity or heavy lifting for 1 week.   a. You may drive when you no longer are taking prescription pain medication, you can comfortably wear a seatbelt, and you can safely maneuver your car and apply brakes. b. RETURN TO WORK:  __________as tolerated as long as no strenuous activity or lifting.  May need 1-2 weeks off._______________ You should see your doctor in the office for a follow-up appointment approximately three-four weeks after your surgery.    WHEN TO CALL YOUR  DOCTOR: 1. Fever over 101.0 2. Nausea and/or vomiting. 3. Extreme swelling or bruising. 4. Continued bleeding from incision. 5. Increased pain, redness, or drainage from the incision.  The clinic staff is available to answer your questions during regular business hours.  Please dont hesitate to call and ask to speak to one of the nurses for clinical concerns.  If you have a medical emergency, go to the nearest emergency room or call 911.  A surgeon from [facility] is always on call at the hospital.  For further questions, please visit [email or web address]           General Anesthesia, Adult, Care After Refer to this sheet in the next few weeks. These instructions provide you with information on caring for yourself after your procedure. Your health care provider may also give you more specific instructions. Your treatment has been planned according to current medical practices, but problems sometimes occur. Call your health care provider if you have any problems or questions after your procedure. WHAT TO EXPECT AFTER THE PROCEDURE After the procedure, it is typical to experience:  Sleepiness.  Nausea and vomiting. HOME CARE INSTRUCTIONS  For the first 24 hours after general anesthesia: ? Have a responsible person with you. ? Do not drive a car. If you are alone, do not take public transportation. ? Do not drink alcohol. ? Do not take medicine that has not been prescribed by your health care provider. ? Do not sign important papers or make important decisions. ? You may resume a normal diet and  activities as directed by your health care provider.  Change bandages (dressings) as directed.  If you have questions or problems that seem related to general anesthesia, call the hospital and ask for the anesthetist or anesthesiologist on call. SEEK MEDICAL CARE IF:  You have nausea and vomiting that continue the day after anesthesia.  You develop a rash. SEEK  IMMEDIATE MEDICAL CARE IF:   You have difficulty breathing.  You have chest pain.  You have any allergic problems.  This information is not intended to replace advice given to you by your health care provider. Make sure you discuss any questions you have with your health care provider.

## 2015-08-29 NOTE — Anesthesia Preprocedure Evaluation (Signed)
Anesthesia Evaluation  Patient identified by MRN, date of birth, ID band Patient awake    History of Anesthesia Complications (+) PONV  Airway Mallampati: II  TM Distance: >3 FB     Dental   Pulmonary    breath sounds clear to auscultation       Cardiovascular hypertension, + Valvular Problems/Murmurs  Rhythm:Regular Rate:Normal     Neuro/Psych    GI/Hepatic Neg liver ROS, GERD  ,  Endo/Other  Hypothyroidism   Renal/GU Renal disease     Musculoskeletal   Abdominal   Peds  Hematology   Anesthesia Other Findings   Reproductive/Obstetrics                             Anesthesia Physical Anesthesia Plan  ASA: III  Anesthesia Plan: General   Post-op Pain Management:    Induction: Intravenous  Airway Management Planned: Oral ETT  Additional Equipment:   Intra-op Plan:   Post-operative Plan: Extubation in OR  Informed Consent: I have reviewed the patients History and Physical, chart, labs and discussed the procedure including the risks, benefits and alternatives for the proposed anesthesia with the patient or authorized representative who has indicated his/her understanding and acceptance.   Dental advisory given  Plan Discussed with: CRNA and Anesthesiologist  Anesthesia Plan Comments:         Anesthesia Quick Evaluation

## 2015-08-30 ENCOUNTER — Encounter (HOSPITAL_COMMUNITY): Payer: Self-pay | Admitting: General Surgery

## 2015-09-09 NOTE — Anesthesia Postprocedure Evaluation (Signed)
Anesthesia Post Note  Patient: Marie Colon  Procedure(s) Performed: Procedure(s) (LRB): EXCISION OF RIGHT THIGH MASS SUBFASICAL 15 CM (Right)  Patient location during evaluation: PACU Anesthesia Type: Spinal Pain management: pain level controlled Vital Signs Assessment: post-procedure vital signs reviewed and stable Respiratory status: spontaneous breathing Cardiovascular status: stable Postop Assessment: spinal receding Anesthetic complications: no    Last Vitals:  Vitals:   08/29/15 1000 08/29/15 1006  BP: 135/69 118/75  Pulse: 69   Resp: 16   Temp: 36.6 C     Last Pain:  Vitals:   08/29/15 1006  TempSrc:   PainSc: 0-No pain                 EDWARDS,Jehieli Brassell

## 2015-09-10 ENCOUNTER — Encounter: Payer: Self-pay | Admitting: Internal Medicine

## 2015-09-10 ENCOUNTER — Ambulatory Visit (INDEPENDENT_AMBULATORY_CARE_PROVIDER_SITE_OTHER): Payer: 59 | Admitting: Internal Medicine

## 2015-09-10 VITALS — BP 108/72 | HR 68 | Temp 99.4°F | Resp 12 | Wt 171.0 lb

## 2015-09-10 DIAGNOSIS — K760 Fatty (change of) liver, not elsewhere classified: Secondary | ICD-10-CM

## 2015-09-10 DIAGNOSIS — D352 Benign neoplasm of pituitary gland: Secondary | ICD-10-CM

## 2015-09-10 DIAGNOSIS — E039 Hypothyroidism, unspecified: Secondary | ICD-10-CM

## 2015-09-10 DIAGNOSIS — E7800 Pure hypercholesterolemia, unspecified: Secondary | ICD-10-CM | POA: Diagnosis not present

## 2015-09-10 DIAGNOSIS — N289 Disorder of kidney and ureter, unspecified: Secondary | ICD-10-CM

## 2015-09-10 DIAGNOSIS — I10 Essential (primary) hypertension: Secondary | ICD-10-CM

## 2015-09-10 DIAGNOSIS — E669 Obesity, unspecified: Secondary | ICD-10-CM

## 2015-09-10 NOTE — Assessment & Plan Note (Signed)
Followed by Dr Gabriel Carina.  Has f/u planned in 09/2015.

## 2015-09-10 NOTE — Assessment & Plan Note (Signed)
Diet and exercise.   

## 2015-09-10 NOTE — Progress Notes (Signed)
Pre visit review using our clinic review tool, if applicable. No additional management support is needed unless otherwise documented below in the visit note. 

## 2015-09-10 NOTE — Assessment & Plan Note (Signed)
On pravastatin.  Low cholesterol diet and exercise.  Follow lipid panel and liver function tests.   

## 2015-09-10 NOTE — Assessment & Plan Note (Signed)
Stay hydrated.  Follow renal function.  

## 2015-09-10 NOTE — Assessment & Plan Note (Signed)
Followed by GI.  See their last note.  Last liver panel improved.  Planning for recheck soon.

## 2015-09-10 NOTE — Progress Notes (Signed)
Patient ID: Marie Colon, female   DOB: 13-Dec-1955, 60 y.o.   MRN: [id number]   Subjective:    Patient ID: Marie Colon, female    DOB: Nov 14, 1955, 60 y.o.   MRN: [id number]  HPI  Patient here for a scheduled follow up.   Recently had right thigh mass removed.  Did well with her surgery.  Also is being followed by GI.  Liver enzymes had increased more.  See their note.  Better on last check.  They are following.  Scheduled to have checked at the end of the month.  Cholesterol increased last labs.  Needs to recheck.  Low cholesterol diet and exercise.  She has adjusted her diet.  Has not been able to be as active with recent surgery, but plans to get more active.    Past Medical History:  Diagnosis Date  . Angiomyolipoma of kidney    evaluated by Dr Bernardo Heater  . Fatty liver   . GERD (gastroesophageal reflux disease)   . Heart murmur    per Dr. Einar Pheasant  . Hypercholesterolemia   . Hypertension   . Hypothyroidism   . Pituitary macroadenoma (HCC)    s/p removal (elevated growth hormone)  . PONV (postoperative nausea and vomiting)   . Tubal pregnancy    s/p rupture  . Wears glasses    Past Surgical History:  Procedure Laterality Date  . ABDOMINAL HYSTERECTOMY  2003   abnormal uterine bleeding  . APPENDECTOMY    . CHOLECYSTECTOMY  2005  . DILATION AND CURETTAGE OF UTERUS    . MASS EXCISION Right 08/29/2015   Procedure: EXCISION OF RIGHT THIGH MASS SUBFASICAL 15 CM;  Surgeon: Stark Klein, MD;  Location: [facility];  Service: General;  Laterality: Right;  . pituitary tumor removal  2006   Family History  Problem Relation Age of Onset  . Brain cancer Father     died - 52  . Diabetes Mother   . Hypothyroidism Sister   . Breast cancer Sister 58    2 sisters. Age 72 and 20  . Sjogren's syndrome Sister   . Diabetes      grandmother   Social History   Social History  . Marital status: Married    Spouse name: N/A  . Number of children: 2  . Years of education: N/A    Occupational History  .  [employer]    [employer]   Social History Main Topics  . Smoking status: Never Smoker  . Smokeless tobacco: Never Used  . Alcohol use No  . Drug use: No  . Sexual activity: Not Asked   Other Topics Concern  . None   Social History Narrative  . None    Outpatient Encounter Prescriptions as of 09/10/2015  Medication Sig  . allopurinol (ZYLOPRIM) 100 MG tablet Take 1 tablet (100 mg total) by mouth daily.  Marland Kitchen ALPRAZolam (XANAX) 0.25 MG tablet Take 1 tablet (0.25 mg total) by mouth 2 (two) times daily as needed for anxiety. (Patient taking differently: Take 0.25 mg by mouth as needed for anxiety. )  . aspirin 81 MG chewable tablet Chew 81 mg by mouth daily.  . cabergoline (DOSTINEX) 0.5 MG tablet Take 0.5 mg by mouth Nightly.   . colchicine 0.6 MG tablet Take 1 tablet (0.6 mg total) by mouth 2 (two) times daily. (Patient taking differently: Take 0.6 mg by mouth as needed. )  . lansoprazole (PREVACID) 30 MG capsule TAKE 1 CAPSULE BY MOUTH DAILY AT  12 NOON.  Marland Kitchen levothyroxine (SYNTHROID, LEVOTHROID) 112 MCG tablet Take 112 mcg by mouth daily before breakfast.   . metoprolol (LOPRESSOR) 50 MG tablet TAKE ONE TABLET BY MOUTH 2 TIMES A DAY  . Multiple Vitamin (MULTIVITAMIN) tablet Take 1 tablet by mouth daily.  . polyethylene glycol powder (GLYCOLAX/MIRALAX) powder Take with your choice of fluid. This is safe to use on a daily basis if needed.  . pravastatin (PRAVACHOL) 40 MG tablet TAKE 1 TABLET (40 MG TOTAL) BY MOUTH DAILY.  . Probiotic Product (PROBIOTIC DAILY PO) Take 1 capsule by mouth daily.   Marland Kitchen triamterene-hydrochlorothiazide (DYAZIDE) 37.5-25 MG capsule TAKE 1 CAPSULE BY MOUTH ONCE DAILY  . [DISCONTINUED] oxyCODONE (OXY IR/ROXICODONE) 5 MG immediate release tablet Take 1-2 tablets (5-10 mg total) by mouth every 6 (six) hours as needed for moderate pain, severe pain or breakthrough pain.   No facility-administered encounter medications on file as of 09/10/2015.      Review of Systems  Constitutional: Negative for appetite change and unexpected weight change.  HENT: Negative for congestion and sinus pressure.        Minimal drainage and hoarseness.  Discussed using saline nasal spray.  Just started today.    Respiratory: Negative for cough, chest tightness and shortness of breath.   Cardiovascular: Negative for chest pain, palpitations and leg swelling.  Gastrointestinal: Negative for abdominal pain, diarrhea, nausea and vomiting.  Musculoskeletal: Negative for back pain and joint swelling.  Skin: Negative for color change and rash.  Neurological: Negative for dizziness, light-headedness and headaches.  Psychiatric/Behavioral: Negative for agitation and dysphoric mood.       Objective:    Physical Exam  Constitutional: She appears well-developed and well-nourished. No distress.  HENT:  Nose: Nose normal.  Mouth/Throat: Oropharynx is clear and moist.  Neck: Neck supple. No thyromegaly present.  Cardiovascular: Normal rate and regular rhythm.   Pulmonary/Chest: Breath sounds normal. No respiratory distress. She has no wheezes.  Abdominal: Soft. Bowel sounds are normal. There is no tenderness.  Musculoskeletal: She exhibits no edema or tenderness.  Lymphadenopathy:    She has no cervical adenopathy.  Skin: No rash noted. No erythema.  Psychiatric: She has a normal mood and affect. Her behavior is normal.    BP 108/72   Pulse 68   Temp 99.4 F (37.4 C)   Resp 12   Wt 171 lb (77.6 kg)   BMI 32.31 kg/m  Wt Readings from Last 3 Encounters:  09/10/15 171 lb (77.6 kg)  08/29/15 172 lb (78 kg)  08/22/15 172 lb 6.4 oz (78.2 kg)     Lab Results  Component Value Date   WBC 7.1 08/22/2015   HGB 13.6 08/22/2015   HCT 40.0 08/22/2015   PLT 209 08/22/2015   GLUCOSE 131 (H) 08/22/2015   CHOL 237 (H) 05/29/2015   TRIG 270 (H) 05/29/2015   HDL 36 (L) 05/29/2015   LDLCALC 147 (H) 05/29/2015   ALT 83 (H) 08/22/2015   AST 60 (H)  08/22/2015   NA 139 08/22/2015   K 3.2 (L) 08/22/2015   CL 102 08/22/2015   CREATININE 1.14 (H) 08/22/2015   BUN 13 08/22/2015   CO2 29 08/22/2015   TSH 3.80 12/14/2014       Assessment & Plan:   Problem List Items Addressed This Visit    Fatty liver    Followed by GI.  See their last note.  Last liver panel improved.  Planning for recheck soon.  Hypercholesteremia    On pravastatin.  Low cholesterol diet and exercise.  Follow lipid panel and liver function tests.        Hypertension - Primary    Blood pressure under good control.  Continue same medication regimen.  Follow pressures.  Follow metabolic panel.        Relevant Orders   Basic metabolic panel   Hypothyroidism    On thyroid replacement.  Last tsh wnl.  Follow.       Obesity (BMI 30-39.9)    Diet and exercise.       Pituitary macroadenoma ([place])    Followed by Dr Gabriel Carina.  Has f/u planned in 09/2015.       Renal insufficiency    Stay hydrated.  Follow renal function.        Other Visit Diagnoses   None.      Einar Pheasant, MD

## 2015-09-10 NOTE — Assessment & Plan Note (Signed)
On thyroid replacement.  Last tsh wnl.  Follow.

## 2015-09-10 NOTE — Assessment & Plan Note (Signed)
Blood pressure under good control.  Continue same medication regimen.  Follow pressures.  Follow metabolic panel.   

## 2015-09-11 ENCOUNTER — Telehealth: Payer: Self-pay | Admitting: Internal Medicine

## 2015-09-11 ENCOUNTER — Encounter: Payer: Self-pay | Admitting: *Deleted

## 2015-09-11 DIAGNOSIS — E876 Hypokalemia: Secondary | ICD-10-CM

## 2015-09-11 LAB — BASIC METABOLIC PANEL
BUN/Creatinine Ratio: 15 (ref 12–28)
BUN: 17 mg/dL (ref 8–27)
CHLORIDE: 98 mmol/L (ref 96–106)
CO2: 26 mmol/L (ref 18–29)
Calcium: 9.6 mg/dL (ref 8.7–10.3)
Creatinine, Ser: 1.16 mg/dL — ABNORMAL HIGH (ref 0.57–1.00)
GFR, EST AFRICAN AMERICAN: 59 mL/min/{1.73_m2} — AB (ref >59)
GFR, EST NON AFRICAN AMERICAN: 51 mL/min/{1.73_m2} — AB (ref >59)
Glucose: 122 mg/dL — ABNORMAL HIGH (ref 65–99)
POTASSIUM: 3.3 mmol/L — AB (ref 3.5–5.2)
SODIUM: 142 mmol/L (ref 134–144)

## 2015-09-11 NOTE — Telephone Encounter (Signed)
Order placed for potassium lab to be drawn at [facility].

## 2015-09-12 NOTE — Telephone Encounter (Signed)
[facility] order placed for f/u potassium.

## 2015-09-25 ENCOUNTER — Other Ambulatory Visit: Payer: Self-pay | Admitting: Internal Medicine

## 2015-09-25 NOTE — Telephone Encounter (Signed)
LOV 09/10/2015. Renaldo Fiddler, CMA

## 2015-09-26 DIAGNOSIS — E220 Acromegaly and pituitary gigantism: Secondary | ICD-10-CM | POA: Diagnosis not present

## 2015-09-26 DIAGNOSIS — R748 Abnormal levels of other serum enzymes: Secondary | ICD-10-CM | POA: Diagnosis not present

## 2015-09-26 DIAGNOSIS — E039 Hypothyroidism, unspecified: Secondary | ICD-10-CM | POA: Diagnosis not present

## 2015-10-02 ENCOUNTER — Other Ambulatory Visit: Payer: 59

## 2015-10-05 ENCOUNTER — Encounter: Payer: Self-pay | Admitting: Internal Medicine

## 2015-10-15 DIAGNOSIS — R748 Abnormal levels of other serum enzymes: Secondary | ICD-10-CM | POA: Diagnosis not present

## 2015-10-15 DIAGNOSIS — E220 Acromegaly and pituitary gigantism: Secondary | ICD-10-CM | POA: Diagnosis not present

## 2015-10-15 DIAGNOSIS — E039 Hypothyroidism, unspecified: Secondary | ICD-10-CM | POA: Diagnosis not present

## 2015-10-15 DIAGNOSIS — Z87898 Personal history of other specified conditions: Secondary | ICD-10-CM | POA: Diagnosis not present

## 2015-10-16 ENCOUNTER — Other Ambulatory Visit: Payer: Self-pay | Admitting: Internal Medicine

## 2015-10-23 DIAGNOSIS — E876 Hypokalemia: Secondary | ICD-10-CM | POA: Diagnosis not present

## 2015-10-24 LAB — POTASSIUM: Potassium: 3.9 mmol/L (ref 3.5–5.2)

## 2015-10-25 ENCOUNTER — Encounter: Payer: Self-pay | Admitting: Internal Medicine

## 2015-10-30 ENCOUNTER — Encounter: Payer: Self-pay | Admitting: Internal Medicine

## 2015-12-10 ENCOUNTER — Encounter: Payer: Self-pay | Admitting: Internal Medicine

## 2015-12-10 ENCOUNTER — Ambulatory Visit (INDEPENDENT_AMBULATORY_CARE_PROVIDER_SITE_OTHER): Payer: 59 | Admitting: Internal Medicine

## 2015-12-10 VITALS — BP 126/88 | HR 67 | Temp 98.4°F | Wt 174.2 lb

## 2015-12-10 DIAGNOSIS — M545 Low back pain, unspecified: Secondary | ICD-10-CM

## 2015-12-10 DIAGNOSIS — N289 Disorder of kidney and ureter, unspecified: Secondary | ICD-10-CM

## 2015-12-10 DIAGNOSIS — I10 Essential (primary) hypertension: Secondary | ICD-10-CM

## 2015-12-10 NOTE — Progress Notes (Signed)
Patient ID: Marie Colon, female   DOB: 1955/05/08, 60 y.o.   MRN: [id number]   Subjective:    Patient ID: Marie Colon, female    DOB: 03/25/1955, 60 y.o.   MRN: [id number]  HPI  Patient here as a work in with concerns regarding some left back/flank/side pain.  States symptoms started five days ago.  Had some dull pain left low back.  Some nausea initially.  This has resolved.  Minimal diarrhea over the weekend, but this has resolved.  Eating.  Drinking.  No vomiting.  Urine smelled stronger.  No dysuria.  No hematuria.  Is better today.  No known injury or trauma.     Past Medical History:  Diagnosis Date  . Angiomyolipoma of kidney    evaluated by Dr Bernardo Heater  . Fatty liver   . GERD (gastroesophageal reflux disease)   . Heart murmur    per Dr. Einar Pheasant  . Hypercholesterolemia   . Hypertension   . Hypothyroidism   . Pituitary macroadenoma (HCC)    s/p removal (elevated growth hormone)  . PONV (postoperative nausea and vomiting)   . Tubal pregnancy    s/p rupture  . Wears glasses    Past Surgical History:  Procedure Laterality Date  . ABDOMINAL HYSTERECTOMY  2003   abnormal uterine bleeding  . APPENDECTOMY    . CHOLECYSTECTOMY  2005  . DILATION AND CURETTAGE OF UTERUS    . MASS EXCISION Right 08/29/2015   Procedure: EXCISION OF RIGHT THIGH MASS SUBFASICAL 15 CM;  Surgeon: Stark Klein, MD;  Location: [facility];  Service: General;  Laterality: Right;  . pituitary tumor removal  2006   Family History  Problem Relation Age of Onset  . Brain cancer Father     died - 18  . Diabetes Mother   . Hypothyroidism Sister   . Breast cancer Sister 64    2 sisters. Age 31 and 17  . Sjogren's syndrome Sister   . Diabetes      grandmother   Social History   Social History  . Marital status: Married    Spouse name: N/A  . Number of children: 2  . Years of education: N/A   Occupational History  .  [employer]    [employer]   Social History Main Topics  . Smoking status:  Never Smoker  . Smokeless tobacco: Never Used  . Alcohol use No  . Drug use: No  . Sexual activity: Not Asked   Other Topics Concern  . None   Social History Narrative  . None    Outpatient Encounter Prescriptions as of 12/10/2015  Medication Sig  . allopurinol (ZYLOPRIM) 100 MG tablet TAKE 1 TABLET (100 MG TOTAL) BY MOUTH DAILY.  Marland Kitchen ALPRAZolam (XANAX) 0.25 MG tablet Take 1 tablet (0.25 mg total) by mouth 2 (two) times daily as needed for anxiety. (Patient taking differently: Take 0.25 mg by mouth as needed for anxiety. )  . aspirin 81 MG chewable tablet Chew 81 mg by mouth daily.  . cabergoline (DOSTINEX) 0.5 MG tablet Take 0.5 mg by mouth Nightly.   . colchicine 0.6 MG tablet Take 1 tablet (0.6 mg total) by mouth 2 (two) times daily. (Patient taking differently: Take 0.6 mg by mouth as needed. )  . levothyroxine (SYNTHROID, LEVOTHROID) 112 MCG tablet Take 112 mcg by mouth daily before breakfast.   . metoprolol (LOPRESSOR) 50 MG tablet TAKE ONE TABLET BY MOUTH 2 TIMES A DAY  . Multiple Vitamin (  MULTIVITAMIN) tablet Take 1 tablet by mouth daily.  . polyethylene glycol powder (GLYCOLAX/MIRALAX) powder Take with your choice of fluid. This is safe to use on a daily basis if needed.  . Probiotic Product (PROBIOTIC DAILY PO) Take 1 capsule by mouth daily.   Marland Kitchen triamterene-hydrochlorothiazide (DYAZIDE) 37.5-25 MG capsule TAKE 1 CAPSULE BY MOUTH ONCE DAILY  . [DISCONTINUED] lansoprazole (PREVACID) 30 MG capsule TAKE 1 CAPSULE BY MOUTH DAILY AT 12 NOON.  . [DISCONTINUED] pravastatin (PRAVACHOL) 40 MG tablet TAKE 1 TABLET (40 MG TOTAL) BY MOUTH DAILY.   No facility-administered encounter medications on file as of 12/10/2015.     Review of Systems  Constitutional: Negative for appetite change and fever.  Respiratory: Negative for cough and shortness of breath.   Cardiovascular: Negative for leg swelling.  Gastrointestinal: Negative for vomiting.       Previous nausea.  Resolved.  Minimal  diarrhea.  Resolved.    Genitourinary: Negative for difficulty urinating and dysuria.       Urine smells stronger.    Musculoskeletal: Positive for back pain. Negative for joint swelling and myalgias.  Skin: Negative for rash.  Neurological: Negative for dizziness and headaches.  Psychiatric/Behavioral: Negative for agitation and dysphoric mood.       Objective:    Physical Exam  Constitutional: She appears well-developed and well-nourished. No distress.  Neck: Neck supple.  Cardiovascular: Normal rate and regular rhythm.   Pulmonary/Chest: Breath sounds normal. No respiratory distress. She has no wheezes.  Abdominal: Soft. Bowel sounds are normal. There is no tenderness.  Musculoskeletal: She exhibits no edema or tenderness.  No back tenderness to palpation.  No CVA tenderness.  No pain with straight leg raise.    Lymphadenopathy:    She has no cervical adenopathy.  Skin: No rash noted. No erythema.  Psychiatric: She has a normal mood and affect. Her behavior is normal.    BP 126/88   Pulse 67   Temp 98.4 F (36.9 C) (Oral)   Wt 174 lb 3.2 oz (79 kg)   SpO2 99%   BMI 32.91 kg/m  Wt Readings from Last 3 Encounters:  12/10/15 174 lb 3.2 oz (79 kg)  09/10/15 171 lb (77.6 kg)  08/29/15 172 lb (78 kg)     Lab Results  Component Value Date   WBC 7.1 08/22/2015   HGB 13.6 08/22/2015   HCT 40.0 08/22/2015   PLT 209 08/22/2015   GLUCOSE 122 (H) 12/10/2015   CHOL 237 (H) 05/29/2015   TRIG 270 (H) 05/29/2015   HDL 36 (L) 05/29/2015   LDLCALC 147 (H) 05/29/2015   ALT 83 (H) 08/22/2015   AST 60 (H) 08/22/2015   NA 142 12/10/2015   K 3.5 12/10/2015   CL 102 12/10/2015   CREATININE 1.20 12/10/2015   BUN 17 12/10/2015   CO2 32 12/10/2015   TSH 3.80 12/14/2014       Assessment & Plan:   Problem List Items Addressed This Visit    Back pain - Primary    Back pain as outlined.  Is better now.  No known injury or trauma.  Will check urine to confirm no infection.  Also  recheck metabolic panel.  Follow. Notify me if pain returns, worsens or changes.        Relevant Orders   Urinalysis, Routine w reflex microscopic (not at [facility]) (Completed)   CULTURE, URINE COMPREHENSIVE (Completed)   Basic metabolic panel (Completed)   Hypertension    Blood pressure under good control.  Continue same medication regimen.  Follow pressures.  Follow metabolic panel.        Renal insufficiency    Stay hydrated.  Follow renal function.            Einar Pheasant, MD

## 2015-12-10 NOTE — Progress Notes (Signed)
Pre visit review using our clinic review tool, if applicable. No additional management support is needed unless otherwise documented below in the visit note. 

## 2015-12-11 ENCOUNTER — Encounter: Payer: Self-pay | Admitting: Internal Medicine

## 2015-12-11 LAB — URINALYSIS, ROUTINE W REFLEX MICROSCOPIC
BILIRUBIN URINE: NEGATIVE
HGB URINE DIPSTICK: NEGATIVE
KETONES UR: NEGATIVE
LEUKOCYTES UA: NEGATIVE
NITRITE: NEGATIVE
RBC / HPF: NONE SEEN (ref 0–?)
Specific Gravity, Urine: 1.015 (ref 1.000–1.030)
TOTAL PROTEIN, URINE-UPE24: NEGATIVE
URINE GLUCOSE: NEGATIVE
UROBILINOGEN UA: 0.2 (ref 0.0–1.0)
pH: 6 (ref 5.0–8.0)

## 2015-12-11 LAB — BASIC METABOLIC PANEL
BUN: 17 mg/dL (ref 6–23)
CHLORIDE: 102 meq/L (ref 96–112)
CO2: 32 mEq/L (ref 19–32)
CREATININE: 1.2 mg/dL (ref 0.40–1.20)
Calcium: 9.9 mg/dL (ref 8.4–10.5)
GFR: 48.63 mL/min — ABNORMAL LOW (ref >60.00)
Glucose, Bld: 122 mg/dL — ABNORMAL HIGH (ref 70–99)
POTASSIUM: 3.5 meq/L (ref 3.5–5.1)
Sodium: 142 mEq/L (ref 135–145)

## 2015-12-12 LAB — CULTURE, URINE COMPREHENSIVE: Organism ID, Bacteria: NO GROWTH

## 2015-12-13 ENCOUNTER — Encounter: Payer: Self-pay | Admitting: Internal Medicine

## 2015-12-16 ENCOUNTER — Other Ambulatory Visit: Payer: Self-pay | Admitting: Internal Medicine

## 2015-12-22 ENCOUNTER — Encounter: Payer: Self-pay | Admitting: Internal Medicine

## 2015-12-22 DIAGNOSIS — M549 Dorsalgia, unspecified: Secondary | ICD-10-CM | POA: Insufficient documentation

## 2015-12-22 NOTE — Assessment & Plan Note (Signed)
Stay hydrated.  Follow renal function.  

## 2015-12-22 NOTE — Assessment & Plan Note (Signed)
Back pain as outlined.  Is better now.  No known injury or trauma.  Will check urine to confirm no infection.  Also recheck metabolic panel.  Follow. Notify me if pain returns, worsens or changes.

## 2015-12-22 NOTE — Assessment & Plan Note (Signed)
Blood pressure under good control.  Continue same medication regimen.  Follow pressures.  Follow metabolic panel.   

## 2015-12-29 ENCOUNTER — Encounter: Payer: Self-pay | Admitting: Internal Medicine

## 2015-12-29 DIAGNOSIS — I10 Essential (primary) hypertension: Secondary | ICD-10-CM

## 2015-12-29 DIAGNOSIS — E7800 Pure hypercholesterolemia, unspecified: Secondary | ICD-10-CM

## 2015-12-31 DIAGNOSIS — I10 Essential (primary) hypertension: Secondary | ICD-10-CM | POA: Diagnosis not present

## 2015-12-31 DIAGNOSIS — E7800 Pure hypercholesterolemia, unspecified: Secondary | ICD-10-CM | POA: Diagnosis not present

## 2015-12-31 NOTE — Telephone Encounter (Signed)
Orders placed for labs to be drawn at [facility] 

## 2016-01-01 LAB — HEPATIC FUNCTION PANEL
ALT: 67 IU/L — ABNORMAL HIGH (ref 0–32)
AST: 52 IU/L — ABNORMAL HIGH (ref 0–40)
Albumin: 4.3 g/dL (ref 3.6–4.8)
Alkaline Phosphatase: 90 IU/L (ref 39–117)
BILIRUBIN, DIRECT: 0.11 mg/dL (ref 0.00–0.40)
Bilirubin Total: 0.3 mg/dL (ref 0.0–1.2)
TOTAL PROTEIN: 6.5 g/dL (ref 6.0–8.5)

## 2016-01-01 LAB — BASIC METABOLIC PANEL WITH GFR
BUN/Creatinine Ratio: 12 (ref 12–28)
BUN: 13 mg/dL (ref 8–27)
CO2: 27 mmol/L (ref 18–29)
Calcium: 9.6 mg/dL (ref 8.7–10.3)
Chloride: 99 mmol/L (ref 96–106)
Creatinine, Ser: 1.1 mg/dL — ABNORMAL HIGH (ref 0.57–1.00)
GFR calc Af Amer: 63 mL/min/1.73
GFR calc non Af Amer: 55 mL/min/1.73 — ABNORMAL LOW
Glucose: 93 mg/dL (ref 65–99)
Potassium: 3.9 mmol/L (ref 3.5–5.2)
Sodium: 144 mmol/L (ref 134–144)

## 2016-01-01 LAB — LIPID PANEL
CHOL/HDL RATIO: 4.7 ratio — AB (ref 0.0–4.4)
Cholesterol, Total: 193 mg/dL (ref 100–199)
HDL: 41 mg/dL (ref >39)
LDL Calculated: 116 mg/dL — ABNORMAL HIGH (ref 0–99)
TRIGLYCERIDES: 178 mg/dL — AB (ref 0–149)
VLDL CHOLESTEROL CAL: 36 mg/dL (ref 5–40)

## 2016-01-01 LAB — TSH: TSH: 1.43 u[IU]/mL (ref 0.450–4.500)

## 2016-01-02 ENCOUNTER — Encounter: Payer: Self-pay | Admitting: Internal Medicine

## 2016-01-06 ENCOUNTER — Ambulatory Visit (INDEPENDENT_AMBULATORY_CARE_PROVIDER_SITE_OTHER): Payer: 59 | Admitting: Internal Medicine

## 2016-01-06 ENCOUNTER — Encounter: Payer: Self-pay | Admitting: Internal Medicine

## 2016-01-06 VITALS — BP 128/80 | HR 70 | Wt 174.0 lb

## 2016-01-06 DIAGNOSIS — E7800 Pure hypercholesterolemia, unspecified: Secondary | ICD-10-CM

## 2016-01-06 DIAGNOSIS — Z1239 Encounter for other screening for malignant neoplasm of breast: Secondary | ICD-10-CM

## 2016-01-06 DIAGNOSIS — N289 Disorder of kidney and ureter, unspecified: Secondary | ICD-10-CM | POA: Diagnosis not present

## 2016-01-06 DIAGNOSIS — I10 Essential (primary) hypertension: Secondary | ICD-10-CM

## 2016-01-06 DIAGNOSIS — Z0000 Encounter for general adult medical examination without abnormal findings: Secondary | ICD-10-CM | POA: Diagnosis not present

## 2016-01-06 DIAGNOSIS — M109 Gout, unspecified: Secondary | ICD-10-CM

## 2016-01-06 DIAGNOSIS — D352 Benign neoplasm of pituitary gland: Secondary | ICD-10-CM

## 2016-01-06 DIAGNOSIS — E039 Hypothyroidism, unspecified: Secondary | ICD-10-CM

## 2016-01-06 DIAGNOSIS — R7989 Other specified abnormal findings of blood chemistry: Secondary | ICD-10-CM | POA: Diagnosis not present

## 2016-01-06 DIAGNOSIS — Z1231 Encounter for screening mammogram for malignant neoplasm of breast: Secondary | ICD-10-CM

## 2016-01-06 DIAGNOSIS — K760 Fatty (change of) liver, not elsewhere classified: Secondary | ICD-10-CM

## 2016-01-06 DIAGNOSIS — E669 Obesity, unspecified: Secondary | ICD-10-CM

## 2016-01-06 DIAGNOSIS — Z8371 Family history of colonic polyps: Secondary | ICD-10-CM

## 2016-01-06 MED ORDER — LANSOPRAZOLE 30 MG PO CPDR: DELAYED_RELEASE_CAPSULE | ORAL | 1 refills | Status: DC

## 2016-01-06 MED ORDER — PRAVASTATIN SODIUM 40 MG PO TABS: ORAL_TABLET | ORAL | 1 refills | Status: DC

## 2016-01-06 MED ORDER — METOPROLOL TARTRATE 50 MG PO TABS
ORAL_TABLET | ORAL | 3 refills | Status: DC
Start: 2016-01-06 — End: 2017-01-13

## 2016-01-06 MED ORDER — TRIAMTERENE-HCTZ 37.5-25 MG PO CAPS: 1.0000 | ORAL_CAPSULE | Freq: Every day | ORAL | 3 refills | Status: DC

## 2016-01-06 MED ORDER — LEVOTHYROXINE SODIUM 112 MCG PO TABS: 112.0000 ug | ORAL_TABLET | Freq: Every day | ORAL | 1 refills | Status: DC

## 2016-01-06 NOTE — Assessment & Plan Note (Signed)
Physical today 01/06/16.  PAP 01/03/15.  Mammogram 02/13/15 - Birads I.

## 2016-01-06 NOTE — Progress Notes (Signed)
Patient ID: Marie Colon, female   DOB: [birth year], 60 y.o.   MRN: [id number]   Subjective:    Patient ID: Marie Colon, female    DOB: 10-21-1955, 60 y.o.   MRN: [id number]  HPI  Patient with past history of hypercholesterolemia, hypertension, hypothyroidism and pituitary macroadenoma s/p removal.  She comes in today to follow up on these issues as well as for a complete physical exam.  She reports she is doing well.  Feels good.  No chest pain.  No sob.  No acid reflux.  No abdominal pain or cramping.  Bowels stable.  Seeing endocrinology for f/u of pituitary adenoma.  Being seen every 6 months.     Past Medical History:  Diagnosis Date  . Angiomyolipoma of kidney    evaluated by Dr Bernardo Heater  . Fatty liver   . GERD (gastroesophageal reflux disease)   . Heart murmur    per Dr. Einar Pheasant  . Hypercholesterolemia   . Hypertension   . Hypothyroidism   . Pituitary macroadenoma ([facility])    s/p removal (elevated growth hormone)  . PONV (postoperative nausea and vomiting)   . Tubal pregnancy    s/p rupture  . Wears glasses    Past Surgical History:  Procedure Laterality Date  . ABDOMINAL HYSTERECTOMY  2003   abnormal uterine bleeding  . APPENDECTOMY    . CHOLECYSTECTOMY  2005  . DILATION AND CURETTAGE OF UTERUS    . MASS EXCISION Right 08/29/2015   Procedure: EXCISION OF RIGHT THIGH MASS SUBFASICAL 15 CM;  Surgeon: Stark Klein, MD;  Location: [facility];  Service: General;  Laterality: Right;  . pituitary tumor removal  2006   Family History  Problem Relation Age of Onset  . Brain cancer Father     died - 44  . Diabetes Mother   . Hypothyroidism Sister   . Breast cancer Sister 30    2 sisters. Age 9 and 81  . Sjogren's syndrome Sister   . Diabetes      grandmother   Social History   Social History  . Marital status: Married    Spouse name: N/A  . Number of children: 2  . Years of education: N/A   Occupational History  .  [employer]    [employer]   Social History  Main Topics  . Smoking status: Never Smoker  . Smokeless tobacco: Never Used  . Alcohol use No  . Drug use: No  . Sexual activity: Not Asked   Other Topics Concern  . None   Social History Narrative  . None    Outpatient Encounter Prescriptions as of 01/06/2016  Medication Sig  . allopurinol (ZYLOPRIM) 100 MG tablet TAKE 1 TABLET (100 MG TOTAL) BY MOUTH DAILY.  Marland Kitchen ALPRAZolam (XANAX) 0.25 MG tablet Take 1 tablet (0.25 mg total) by mouth 2 (two) times daily as needed for anxiety. (Patient taking differently: Take 0.25 mg by mouth as needed for anxiety. )  . aspirin 81 MG chewable tablet Chew 81 mg by mouth daily.  . cabergoline (DOSTINEX) 0.5 MG tablet Take 0.5 mg by mouth Nightly.   . colchicine 0.6 MG tablet Take 1 tablet (0.6 mg total) by mouth 2 (two) times daily. (Patient taking differently: Take 0.6 mg by mouth as needed. )  . lansoprazole (PREVACID) 30 MG capsule TAKE 1 CAPSULE BY MOUTH DAILY AT 12 NOON.  Marland Kitchen levothyroxine (SYNTHROID, LEVOTHROID) 112 MCG tablet Take 1 tablet (112 mcg total) by mouth daily before  breakfast.  . metoprolol (LOPRESSOR) 50 MG tablet TAKE ONE TABLET BY MOUTH 2 TIMES A DAY  . Multiple Vitamin (MULTIVITAMIN) tablet Take 1 tablet by mouth daily.  . polyethylene glycol powder (GLYCOLAX/MIRALAX) powder Take with your choice of fluid. This is safe to use on a daily basis if needed.  . pravastatin (PRAVACHOL) 40 MG tablet TAKE 1 TABLET (40 MG TOTAL) BY MOUTH DAILY.  . Probiotic Product (PROBIOTIC DAILY PO) Take 1 capsule by mouth daily.   Marland Kitchen triamterene-hydrochlorothiazide (DYAZIDE) 37.5-25 MG capsule Take 1 each (1 capsule total) by mouth daily.  . [DISCONTINUED] lansoprazole (PREVACID) 30 MG capsule TAKE 1 CAPSULE BY MOUTH DAILY AT 12 NOON.  . [DISCONTINUED] levothyroxine (SYNTHROID, LEVOTHROID) 112 MCG tablet Take 112 mcg by mouth daily before breakfast.   . [DISCONTINUED] metoprolol (LOPRESSOR) 50 MG tablet TAKE ONE TABLET BY MOUTH 2 TIMES A DAY  .  [DISCONTINUED] pravastatin (PRAVACHOL) 40 MG tablet TAKE 1 TABLET (40 MG TOTAL) BY MOUTH DAILY.  . [DISCONTINUED] triamterene-hydrochlorothiazide (DYAZIDE) 37.5-25 MG capsule TAKE 1 CAPSULE BY MOUTH ONCE DAILY   No facility-administered encounter medications on file as of 01/06/2016.     Review of Systems  Constitutional: Negative for appetite change and unexpected weight change.  HENT: Negative for congestion and sinus pressure.   Eyes: Negative for pain and visual disturbance.  Respiratory: Negative for cough, chest tightness and shortness of breath.   Cardiovascular: Negative for chest pain, palpitations and leg swelling.  Gastrointestinal: Negative for abdominal pain, diarrhea, nausea and vomiting.  Genitourinary: Negative for difficulty urinating and dysuria.  Musculoskeletal: Negative for back pain and joint swelling.  Skin: Negative for color change and rash.  Neurological: Negative for dizziness, light-headedness and headaches.  Hematological: Negative for adenopathy. Does not bruise/bleed easily.  Psychiatric/Behavioral: Negative for agitation and dysphoric mood.       Objective:    Physical Exam  Constitutional: She is oriented to person, place, and time. She appears well-developed and well-nourished. No distress.  HENT:  Nose: Nose normal.  Mouth/Throat: Oropharynx is clear and moist.  Eyes: Right eye exhibits no discharge. Left eye exhibits no discharge. No scleral icterus.  Neck: Neck supple. No thyromegaly present.  Cardiovascular: Normal rate and regular rhythm.   Pulmonary/Chest: Breath sounds normal. No accessory muscle usage. No tachypnea. No respiratory distress. She has no decreased breath sounds. She has no wheezes. She has no rhonchi. Right breast exhibits no inverted nipple, no mass, no nipple discharge and no tenderness (no axillary adenopathy). Left breast exhibits no inverted nipple, no mass, no nipple discharge and no tenderness (no axilarry adenopathy).    Abdominal: Soft. Bowel sounds are normal. There is no tenderness.  Musculoskeletal: She exhibits no edema or tenderness.  Lymphadenopathy:    She has no cervical adenopathy.  Neurological: She is alert and oriented to person, place, and time.  Skin: Skin is warm. No rash noted. No erythema.  Psychiatric: She has a normal mood and affect. Her behavior is normal.    BP 128/80   Pulse 70   Wt 174 lb (78.9 kg)   SpO2 98%   BMI 32.88 kg/m  Wt Readings from Last 3 Encounters:  01/06/16 174 lb (78.9 kg)  12/10/15 174 lb 3.2 oz (79 kg)  09/10/15 171 lb (77.6 kg)     Lab Results  Component Value Date   WBC 7.1 08/22/2015   HGB 13.6 08/22/2015   HCT 40.0 08/22/2015   PLT 209 08/22/2015   GLUCOSE 93 12/31/2015  CHOL 193 12/31/2015   TRIG 178 (H) 12/31/2015   HDL 41 12/31/2015   LDLCALC 116 (H) 12/31/2015   ALT 67 (H) 12/31/2015   AST 52 (H) 12/31/2015   NA 144 12/31/2015   K 3.9 12/31/2015   CL 99 12/31/2015   CREATININE 1.10 (H) 12/31/2015   BUN 13 12/31/2015   CO2 27 12/31/2015   TSH 1.430 12/31/2015       Assessment & Plan:   Problem List Items Addressed This Visit    Family history of colonic polyps    Colonoscopy 09/21/12.  Recommended f/u colonoscopy in five years.        Fatty liver    Followed by GI.  Diet, exercise and weight loss.        Gout    No flares.  Follow.       Health care maintenance    Physical today 01/06/16.  PAP 01/03/15.  Mammogram 02/13/15 - Birads I.        Hypercholesteremia    On pravastatin.  Low cholesterol diet and exercise.  Follow lipid panel and liver function tests.        Relevant Medications   triamterene-hydrochlorothiazide (DYAZIDE) 37.5-25 MG capsule   pravastatin (PRAVACHOL) 40 MG tablet   metoprolol (LOPRESSOR) 50 MG tablet   Hypertension    Blood pressure under good control.  Continue same medication regimen.  Follow pressures.  Follow metabolic panel.         Relevant Medications    triamterene-hydrochlorothiazide (DYAZIDE) 37.5-25 MG capsule   pravastatin (PRAVACHOL) 40 MG tablet   metoprolol (LOPRESSOR) 50 MG tablet   Hypothyroidism    On thyroid replacement.  Follow tsh.        Relevant Medications   metoprolol (LOPRESSOR) 50 MG tablet   levothyroxine (SYNTHROID, LEVOTHROID) 112 MCG tablet   Obesity (BMI 30-39.9)    Diet and exercise.  Follow.        Pituitary macroadenoma ([place])    Followed by Dr Gabriel Carina.  Last evaluated 09/2015.  Recommended f/u 6 months.  Stable.        Renal insufficiency    Stay hydrated.  Follow renal function.  Creatinine 1.10 12/31/15.         Other Visit Diagnoses    Breast cancer screening    -  Primary   Relevant Orders   MM DIGITAL SCREENING BILATERAL       Einar Pheasant, MD

## 2016-01-12 ENCOUNTER — Encounter: Payer: Self-pay | Admitting: Internal Medicine

## 2016-01-12 NOTE — Assessment & Plan Note (Signed)
On pravastatin.  Low cholesterol diet and exercise.  Follow lipid panel and liver function tests.   

## 2016-01-12 NOTE — Assessment & Plan Note (Signed)
Followed by Dr Gabriel Carina.  Last evaluated 09/2015.  Recommended f/u 6 months.  Stable.

## 2016-01-12 NOTE — Assessment & Plan Note (Signed)
Diet and exercise.  Follow.  

## 2016-01-12 NOTE — Assessment & Plan Note (Signed)
Colonoscopy 09/21/12.  Recommended f/u colonoscopy in five years.  

## 2016-01-12 NOTE — Assessment & Plan Note (Signed)
Blood pressure under good control.  Continue same medication regimen.  Follow pressures.  Follow metabolic panel.   

## 2016-01-12 NOTE — Assessment & Plan Note (Signed)
No flares.  Follow.

## 2016-01-12 NOTE — Assessment & Plan Note (Signed)
Stay hydrated.  Follow renal function.  Creatinine 1.10 12/31/15.

## 2016-01-12 NOTE — Assessment & Plan Note (Signed)
On thyroid replacement.  Follow tsh.  

## 2016-01-12 NOTE — Assessment & Plan Note (Signed)
Followed by GI.  Diet, exercise and weight loss.

## 2016-02-13 ENCOUNTER — Ambulatory Visit
Admission: RE | Admit: 2016-02-13 | Discharge: 2016-02-13 | Disposition: A | Discharge status: Home / Self Care | Payer: 59 | Source: Ambulatory Visit | Attending: Internal Medicine | Admitting: Internal Medicine

## 2016-02-13 DIAGNOSIS — Z1239 Encounter for other screening for malignant neoplasm of breast: Secondary | ICD-10-CM

## 2016-02-17 ENCOUNTER — Other Ambulatory Visit: Payer: Self-pay | Admitting: Internal Medicine

## 2016-02-25 ENCOUNTER — Ambulatory Visit
Admission: RE | Admit: 2016-02-25 | Discharge: 2016-02-25 | Disposition: A | Discharge status: Home / Self Care | Payer: 59 | Source: Ambulatory Visit | Attending: Internal Medicine | Admitting: Internal Medicine

## 2016-02-25 ENCOUNTER — Other Ambulatory Visit: Payer: Self-pay | Admitting: Internal Medicine

## 2016-02-25 DIAGNOSIS — Z1231 Encounter for screening mammogram for malignant neoplasm of breast: Secondary | ICD-10-CM | POA: Diagnosis not present

## 2016-02-25 DIAGNOSIS — Z1239 Encounter for other screening for malignant neoplasm of breast: Secondary | ICD-10-CM

## 2016-03-23 DIAGNOSIS — E220 Acromegaly and pituitary gigantism: Secondary | ICD-10-CM | POA: Diagnosis not present

## 2016-03-23 DIAGNOSIS — E031 Congenital hypothyroidism without goiter: Secondary | ICD-10-CM | POA: Diagnosis not present

## 2016-03-23 DIAGNOSIS — K760 Fatty (change of) liver, not elsewhere classified: Secondary | ICD-10-CM | POA: Diagnosis not present

## 2016-03-23 DIAGNOSIS — R7989 Other specified abnormal findings of blood chemistry: Secondary | ICD-10-CM | POA: Diagnosis not present

## 2016-03-24 DIAGNOSIS — H5213 Myopia, bilateral: Secondary | ICD-10-CM | POA: Diagnosis not present

## 2016-04-13 ENCOUNTER — Encounter: Payer: Self-pay | Admitting: Internal Medicine

## 2016-04-13 DIAGNOSIS — E039 Hypothyroidism, unspecified: Secondary | ICD-10-CM | POA: Diagnosis not present

## 2016-04-13 DIAGNOSIS — R748 Abnormal levels of other serum enzymes: Secondary | ICD-10-CM | POA: Diagnosis not present

## 2016-04-13 DIAGNOSIS — Z87898 Personal history of other specified conditions: Secondary | ICD-10-CM | POA: Diagnosis not present

## 2016-04-13 DIAGNOSIS — E220 Acromegaly and pituitary gigantism: Secondary | ICD-10-CM | POA: Diagnosis not present

## 2016-04-15 NOTE — Telephone Encounter (Signed)
Called pt.  Discussed aspirin therapy.  She is on for preventative measures.  Does not have h/o TIA, CVA, afib, etc.  Understands risk of stopping and is ok.

## 2016-04-21 DIAGNOSIS — L089 Local infection of the skin and subcutaneous tissue, unspecified: Secondary | ICD-10-CM | POA: Diagnosis not present

## 2016-04-21 DIAGNOSIS — D485 Neoplasm of uncertain behavior of skin: Secondary | ICD-10-CM | POA: Diagnosis not present

## 2016-04-21 DIAGNOSIS — Z86018 Personal history of other benign neoplasm: Secondary | ICD-10-CM

## 2016-04-21 HISTORY — DX: Personal history of other benign neoplasm: Z86.018

## 2016-04-24 ENCOUNTER — Encounter: Payer: Self-pay | Admitting: Internal Medicine

## 2016-04-28 DIAGNOSIS — D235 Other benign neoplasm of skin of trunk: Secondary | ICD-10-CM | POA: Diagnosis not present

## 2016-04-28 DIAGNOSIS — D229 Melanocytic nevi, unspecified: Secondary | ICD-10-CM | POA: Diagnosis not present

## 2016-05-12 ENCOUNTER — Encounter: Payer: Self-pay | Admitting: Internal Medicine

## 2016-05-12 ENCOUNTER — Ambulatory Visit (INDEPENDENT_AMBULATORY_CARE_PROVIDER_SITE_OTHER): Payer: 59 | Admitting: Internal Medicine

## 2016-05-12 DIAGNOSIS — I10 Essential (primary) hypertension: Secondary | ICD-10-CM

## 2016-05-12 DIAGNOSIS — L989 Disorder of the skin and subcutaneous tissue, unspecified: Secondary | ICD-10-CM

## 2016-05-12 DIAGNOSIS — E039 Hypothyroidism, unspecified: Secondary | ICD-10-CM

## 2016-05-12 DIAGNOSIS — E7800 Pure hypercholesterolemia, unspecified: Secondary | ICD-10-CM

## 2016-05-12 DIAGNOSIS — D352 Benign neoplasm of pituitary gland: Secondary | ICD-10-CM | POA: Diagnosis not present

## 2016-05-12 DIAGNOSIS — K760 Fatty (change of) liver, not elsewhere classified: Secondary | ICD-10-CM

## 2016-05-12 MED ORDER — LEVOTHYROXINE SODIUM 112 MCG PO TABS: 112.0000 ug | ORAL_TABLET | Freq: Every day | ORAL | 1 refills | Status: DC

## 2016-05-12 MED ORDER — PRAVASTATIN SODIUM 40 MG PO TABS: ORAL_TABLET | ORAL | 1 refills | Status: DC

## 2016-05-12 MED ORDER — ALLOPURINOL 100 MG PO TABS: 100.0000 mg | ORAL_TABLET | Freq: Every day | ORAL | 1 refills | Status: DC

## 2016-05-12 MED ORDER — LANSOPRAZOLE 30 MG PO CPDR: DELAYED_RELEASE_CAPSULE | ORAL | 1 refills | Status: DC

## 2016-05-12 NOTE — Progress Notes (Signed)
Patient ID: Marie Colon, female   DOB: 09-18-1955, 61 y.o.   MRN: [id number]   Subjective:    Patient ID: Marie Colon, female    DOB: June 29, 1955, 61 y.o.   MRN: [id number]  HPI  Patient here for a scheduled follow up.  Is followed by Dr Gabriel Carina for history of acromegaly.  Diagnosed in 2006 with macroadenoma.  s/p transnasal transphenoidal pituitary tumor resection in 2006 by Dr Tommi Rumps.  On cabergoline.  Recently evaluated in 03/2016.  Labs stabel. Recommended f/u in 6 months.  She is also followed by GI for elevated LFTs secondary to NAFLD.  s/p extensive w/up.  No changes made.  Did recommend twinrix.  She was unable to get the vaccine.  Plans to discuss with GI. Increased stress.  Discussed with her today.  She feels she is handling things relatively well.  No chest pain.  No sob.  No acid reflux.  No abdominal pain.  Bowels moving.     Past Medical History:  Diagnosis Date  . Angiomyolipoma of kidney    evaluated by Dr Bernardo Heater  . Fatty liver   . GERD (gastroesophageal reflux disease)   . Heart murmur    per Dr. Einar Pheasant  . Hypercholesterolemia   . Hypertension   . Hypothyroidism   . Pituitary macroadenoma (HCC)    s/p removal (elevated growth hormone)  . PONV (postoperative nausea and vomiting)   . Tubal pregnancy    s/p rupture  . Wears glasses    Past Surgical History:  Procedure Laterality Date  . ABDOMINAL HYSTERECTOMY  2003   abnormal uterine bleeding  . APPENDECTOMY    . CHOLECYSTECTOMY  2005  . DILATION AND CURETTAGE OF UTERUS    . MASS EXCISION Right 08/29/2015   Procedure: EXCISION OF RIGHT THIGH MASS SUBFASICAL 15 CM;  Surgeon: Stark Klein, MD;  Location: [facility];  Service: General;  Laterality: Right;  . pituitary tumor removal  2006   Family History  Problem Relation Age of Onset  . Brain cancer Father     died - 28  . Diabetes Mother   . Hypothyroidism Sister   . Breast cancer Sister 77  . Sjogren's syndrome Sister   . Breast cancer Sister 7    . Diabetes      grandmother   Social History   Social History  . Marital status: Married    Spouse name: N/A  . Number of children: 2  . Years of education: N/A   Occupational History  .  [employer]    [employer]   Social History Main Topics  . Smoking status: Never Smoker  . Smokeless tobacco: Never Used  . Alcohol use No  . Drug use: No  . Sexual activity: Not Asked   Other Topics Concern  . None   Social History Narrative  . None    Outpatient Encounter Prescriptions as of 05/12/2016  Medication Sig  . allopurinol (ZYLOPRIM) 100 MG tablet Take 1 tablet (100 mg total) by mouth daily.  Marland Kitchen ALPRAZolam (XANAX) 0.25 MG tablet Take 1 tablet (0.25 mg total) by mouth 2 (two) times daily as needed for anxiety. (Patient taking differently: Take 0.25 mg by mouth as needed for anxiety. )  . aspirin 81 MG chewable tablet Chew 81 mg by mouth daily.  . cabergoline (DOSTINEX) 0.5 MG tablet Take 0.5 mg by mouth Nightly.   . cabergoline (DOSTINEX) 0.5 MG tablet Take 1 tablet by mouth at bedtime.  . colchicine  0.6 MG tablet Take 1 tablet (0.6 mg total) by mouth 2 (two) times daily. (Patient taking differently: Take 0.6 mg by mouth as needed. )  . lansoprazole (PREVACID) 30 MG capsule TAKE 1 CAPSULE BY MOUTH DAILY AT 12 NOON.  Marland Kitchen levothyroxine (SYNTHROID, LEVOTHROID) 112 MCG tablet Take 1 tablet (112 mcg total) by mouth daily before breakfast.  . metoprolol (LOPRESSOR) 50 MG tablet TAKE ONE TABLET BY MOUTH 2 TIMES A DAY  . Multiple Vitamin (MULTIVITAMIN) tablet Take 1 tablet by mouth daily.  . polyethylene glycol powder (GLYCOLAX/MIRALAX) powder Take with your choice of fluid. This is safe to use on a daily basis if needed.  . pravastatin (PRAVACHOL) 40 MG tablet TAKE 1 TABLET (40 MG TOTAL) BY MOUTH DAILY.  . Probiotic Product (PROBIOTIC DAILY PO) Take 1 capsule by mouth daily.   Marland Kitchen triamterene-hydrochlorothiazide (DYAZIDE) 37.5-25 MG capsule Take 1 each (1 capsule total) by mouth daily.  .  [DISCONTINUED] allopurinol (ZYLOPRIM) 100 MG tablet TAKE 1 TABLET (100 MG TOTAL) BY MOUTH DAILY.  . [DISCONTINUED] lansoprazole (PREVACID) 30 MG capsule TAKE 1 CAPSULE BY MOUTH DAILY AT 12 NOON.  . [DISCONTINUED] levothyroxine (SYNTHROID, LEVOTHROID) 112 MCG tablet Take 1 tablet (112 mcg total) by mouth daily before breakfast.  . [DISCONTINUED] pravastatin (PRAVACHOL) 40 MG tablet TAKE 1 TABLET (40 MG TOTAL) BY MOUTH DAILY.   No facility-administered encounter medications on file as of 05/12/2016.     Review of Systems  Constitutional: Negative for appetite change and unexpected weight change.  HENT: Negative for congestion and sinus pressure.   Respiratory: Negative for cough, chest tightness and shortness of breath.   Cardiovascular: Negative for chest pain, palpitations and leg swelling.  Gastrointestinal: Negative for abdominal pain, diarrhea, nausea and vomiting.  Genitourinary: Negative for difficulty urinating and dysuria.  Musculoskeletal: Negative for back pain and joint swelling.  Skin: Negative for color change and rash.  Neurological: Negative for dizziness, light-headedness and headaches.  Psychiatric/Behavioral: Negative for agitation and dysphoric mood.       Objective:     Blood pressure rechecked by me:  124/78  Physical Exam  Constitutional: She appears well-developed and well-nourished. No distress.  HENT:  Nose: Nose normal.  Mouth/Throat: Oropharynx is clear and moist.  Neck: Neck supple. No thyromegaly present.  Cardiovascular: Normal rate and regular rhythm.   Pulmonary/Chest: Breath sounds normal. No respiratory distress. She has no wheezes.  Abdominal: Soft. Bowel sounds are normal. There is no tenderness.  Musculoskeletal: She exhibits no edema or tenderness.  Lymphadenopathy:    She has no cervical adenopathy.  Skin: No rash noted. No erythema.  Psychiatric: She has a normal mood and affect. Her behavior is normal.    BP 110/68 (BP Location: Left  Arm, Patient Position: Sitting, Cuff Size: Normal)   Pulse 61   Temp 98.8 F (37.1 C) (Oral)   Resp 12   Ht 5\' 2"  (1.575 m)   Wt 176 lb 3.2 oz (79.9 kg)   SpO2 97%   BMI 32.23 kg/m  Wt Readings from Last 3 Encounters:  05/12/16 176 lb 3.2 oz (79.9 kg)  01/06/16 174 lb (78.9 kg)  12/10/15 174 lb 3.2 oz (79 kg)     Lab Results  Component Value Date   WBC 7.1 08/22/2015   HGB 13.6 08/22/2015   HCT 40.0 08/22/2015   PLT 209 08/22/2015   GLUCOSE 93 12/31/2015   CHOL 193 12/31/2015   TRIG 178 (H) 12/31/2015   HDL 41 12/31/2015   [place]  116 (H) 12/31/2015   ALT 67 (H) 12/31/2015   AST 52 (H) 12/31/2015   NA 144 12/31/2015   K 3.9 12/31/2015   CL 99 12/31/2015   CREATININE 1.10 (H) 12/31/2015   BUN 13 12/31/2015   CO2 27 12/31/2015   TSH 1.430 12/31/2015    Mm Screening Breast Tomo Bilateral  Result Date: 02/25/2016 CLINICAL DATA:  Screening. EXAM: 2D DIGITAL SCREENING BILATERAL MAMMOGRAM WITH CAD AND ADJUNCT TOMO COMPARISON:  Previous exam(s). ACR Breast Density Category b: There are scattered areas of fibroglandular density. FINDINGS: There are no findings suspicious for malignancy. Images were processed with CAD. IMPRESSION: No mammographic evidence of malignancy. A result letter of this screening mammogram will be mailed directly to the patient. RECOMMENDATION: Screening mammogram in one year. (Code:SM-B-01Y) BI-RADS CATEGORY  1: Negative. Electronically Signed   By: Everlean Alstrom M.D.   On: 02/25/2016 08:49       Assessment & Plan:   Problem List Items Addressed This Visit    Fatty liver    Followed by GI.  Diet, exercise and weight loss.  Recheck liver panel.  Will call GI regarding hepatitis vaccine.        Hypercholesteremia    Low cholesterol diet and exercise.  On pravastatin.        Relevant Medications   pravastatin (PRAVACHOL) 40 MG tablet   Hypertension    Blood pressure under good control.  Continue same medication regimen. Follow pressures.   Follow metabolic panel.        Relevant Medications   pravastatin (PRAVACHOL) 40 MG tablet   Hypothyroidism    On thyroid replacement.  Follow tsh.        Relevant Medications   levothyroxine (SYNTHROID, LEVOTHROID) 112 MCG tablet   Pituitary macroadenoma (HCC)    Followed by Dr Gabriel Carina.  Just evaluated 03/2016.  Stable.        Scalp lesion    Saw dermatology.  Doing well.  Has f/u soon.           Einar Pheasant, MD

## 2016-05-12 NOTE — Assessment & Plan Note (Signed)
Blood pressure under good control.  Continue same medication regimen.  Follow pressures.  Follow metabolic panel.   

## 2016-05-12 NOTE — Assessment & Plan Note (Signed)
Low cholesterol diet and exercise.  On pravastatin.

## 2016-05-12 NOTE — Assessment & Plan Note (Signed)
Followed by GI.  Diet, exercise and weight loss.  Recheck liver panel.  Will call GI regarding hepatitis vaccine.

## 2016-05-12 NOTE — Progress Notes (Signed)
Pre-visit discussion using our clinic review tool. No additional management support is needed unless otherwise documented below in the visit note.  

## 2016-05-12 NOTE — Assessment & Plan Note (Signed)
Followed by Dr Gabriel Carina.  Just evaluated 03/2016.  Stable.

## 2016-05-12 NOTE — Assessment & Plan Note (Signed)
On thyroid replacement.  Follow tsh.  

## 2016-05-12 NOTE — Assessment & Plan Note (Signed)
Saw dermatology.  Doing well.  Has f/u soon.

## 2016-06-02 ENCOUNTER — Encounter: Payer: Self-pay | Admitting: Internal Medicine

## 2016-06-03 NOTE — Telephone Encounter (Signed)
Ok to schedule establish care appt. Thanks

## 2016-06-03 NOTE — Telephone Encounter (Signed)
Lm on vm yesterday and today to sched pt daughter

## 2016-08-24 DIAGNOSIS — L719 Rosacea, unspecified: Secondary | ICD-10-CM | POA: Diagnosis not present

## 2016-08-24 DIAGNOSIS — L918 Other hypertrophic disorders of the skin: Secondary | ICD-10-CM | POA: Diagnosis not present

## 2016-08-24 DIAGNOSIS — D1800 Hemangioma unspecified site: Secondary | ICD-10-CM | POA: Diagnosis not present

## 2016-08-24 DIAGNOSIS — L821 Other seborrheic keratosis: Secondary | ICD-10-CM | POA: Diagnosis not present

## 2016-08-24 DIAGNOSIS — D229 Melanocytic nevi, unspecified: Secondary | ICD-10-CM | POA: Diagnosis not present

## 2016-08-24 DIAGNOSIS — L812 Freckles: Secondary | ICD-10-CM | POA: Diagnosis not present

## 2016-08-24 DIAGNOSIS — D485 Neoplasm of uncertain behavior of skin: Secondary | ICD-10-CM | POA: Diagnosis not present

## 2016-08-24 DIAGNOSIS — Z1283 Encounter for screening for malignant neoplasm of skin: Secondary | ICD-10-CM | POA: Diagnosis not present

## 2016-09-04 ENCOUNTER — Other Ambulatory Visit: Payer: Self-pay | Admitting: Internal Medicine

## 2016-09-04 DIAGNOSIS — E7800 Pure hypercholesterolemia, unspecified: Secondary | ICD-10-CM | POA: Diagnosis not present

## 2016-09-04 DIAGNOSIS — E220 Acromegaly and pituitary gigantism: Secondary | ICD-10-CM | POA: Diagnosis not present

## 2016-09-04 DIAGNOSIS — R739 Hyperglycemia, unspecified: Secondary | ICD-10-CM | POA: Diagnosis not present

## 2016-09-04 DIAGNOSIS — E039 Hypothyroidism, unspecified: Secondary | ICD-10-CM | POA: Diagnosis not present

## 2016-09-04 DIAGNOSIS — I10 Essential (primary) hypertension: Secondary | ICD-10-CM | POA: Diagnosis not present

## 2016-09-05 LAB — BASIC METABOLIC PANEL
BUN / CREAT RATIO: 13 (ref 12–28)
BUN: 15 mg/dL (ref 8–27)
CALCIUM: 9.8 mg/dL (ref 8.7–10.3)
CO2: 27 mmol/L (ref 20–29)
Chloride: 98 mmol/L (ref 96–106)
Creatinine, Ser: 1.14 mg/dL — ABNORMAL HIGH (ref 0.57–1.00)
GFR, EST AFRICAN AMERICAN: 60 mL/min/{1.73_m2} (ref >59)
GFR, EST NON AFRICAN AMERICAN: 52 mL/min/{1.73_m2} — AB (ref >59)
Glucose: 97 mg/dL (ref 65–99)
POTASSIUM: 3.8 mmol/L (ref 3.5–5.2)
Sodium: 141 mmol/L (ref 134–144)

## 2016-09-05 LAB — AMBIG ABBREV BMP8 DEFAULT

## 2016-09-05 LAB — LIPID PANEL W/O CHOL/HDL RATIO
Cholesterol, Total: 204 mg/dL — ABNORMAL HIGH (ref 100–199)
HDL: 42 mg/dL (ref >39)
LDL CALC: 119 mg/dL — AB (ref 0–99)
Triglycerides: 217 mg/dL — ABNORMAL HIGH (ref 0–149)
VLDL CHOLESTEROL CAL: 43 mg/dL — AB (ref 5–40)

## 2016-09-05 LAB — HEPATIC FUNCTION PANEL
ALT: 86 IU/L — ABNORMAL HIGH (ref 0–32)
AST: 67 IU/L — ABNORMAL HIGH (ref 0–40)
Albumin: 4.6 g/dL (ref 3.6–4.8)
Alkaline Phosphatase: 93 IU/L (ref 39–117)
Bilirubin Total: 0.4 mg/dL (ref 0.0–1.2)
Bilirubin, Direct: 0.15 mg/dL (ref 0.00–0.40)
Total Protein: 6.8 g/dL (ref 6.0–8.5)

## 2016-09-05 LAB — HGB A1C W/O EAG: HEMOGLOBIN A1C: 5.8 % — AB (ref 4.8–5.6)

## 2016-09-05 LAB — TSH: TSH: 0.736 u[IU]/mL (ref 0.450–4.500)

## 2016-09-08 ENCOUNTER — Encounter: Payer: Self-pay | Admitting: Internal Medicine

## 2016-09-18 ENCOUNTER — Encounter: Payer: Self-pay | Admitting: Family Medicine

## 2016-09-18 ENCOUNTER — Ambulatory Visit (INDEPENDENT_AMBULATORY_CARE_PROVIDER_SITE_OTHER): Payer: 59 | Admitting: Family Medicine

## 2016-09-18 VITALS — BP 100/78 | HR 79 | Temp 99.9°F | Resp 16 | Wt 170.5 lb

## 2016-09-18 DIAGNOSIS — B349 Viral infection, unspecified: Secondary | ICD-10-CM | POA: Insufficient documentation

## 2016-09-18 LAB — POCT INFLUENZA A/B
INFLUENZA A, POC: NEGATIVE
INFLUENZA B, POC: NEGATIVE

## 2016-09-18 NOTE — Progress Notes (Signed)
   Subjective:  Patient ID: Marie Colon, female    DOB: [birth year]  Age: 61 y.o. MRN: [id number]  CC: Fever, chills, body aches  HPI:  61 year old female presents with the above complaints.  Patient states that her symptoms started yesterday. She's had fever, Tmax 101.7, chills, body aches. Associated headache with fever. Occasional cough but states it is not very often. No upper respiratory symptoms. No shortness of breath. No abdominal pain. No UTI symptoms. No known exacerbating or relieving factors. No medications or interventions tried. No other associated symptoms. No other complaints at this time.  Social Hx   Social History   Social History  . Marital status: Married    Spouse name: N/A  . Number of children: 2  . Years of education: N/A   Occupational History  .  [employer]    [employer]   Social History Main Topics  . Smoking status: Never Smoker  . Smokeless tobacco: Never Used  . Alcohol use No  . Drug use: No  . Sexual activity: Not Asked   Other Topics Concern  . None   Social History Narrative  . None    Review of Systems  Constitutional: Positive for chills and fever.  Respiratory:       Occasional cough.  Musculoskeletal:       Body aches.   Objective:  BP 100/78 (BP Location: Left Arm, Patient Position: Sitting, Cuff Size: Normal)   Pulse 79   Temp 99.9 F (37.7 C) (Oral)   Resp 16   Wt 170 lb 8 oz (77.3 kg)   SpO2 95%   BMI 31.18 kg/m   BP/Weight 09/18/2016 05/12/2016 76/19/5093  Systolic BP 267 124 580  Diastolic BP 78 68 80  Wt. (Lbs) 170.5 176.2 174  BMI 31.18 32.23 32.88    Physical Exam  Constitutional: She is oriented to person, place, and time. She appears well-developed. No distress.  HENT:  Head: Normocephalic and atraumatic.  Mouth/Throat: Oropharynx is clear and moist.  Normal TM's bilaterally.  Eyes: Conjunctivae are normal. No scleral icterus.  Cardiovascular: Normal rate and regular rhythm.   No murmur  heard. Pulmonary/Chest: Effort normal. She has no wheezes. She has no rales.  Abdominal: Soft. She exhibits no distension. There is no tenderness. There is no rebound and no guarding.  Neurological: She is alert and oriented to person, place, and time.  Psychiatric: She has a normal mood and affect.  Vitals reviewed.   Lab Results  Component Value Date   WBC 7.1 08/22/2015   HGB 13.6 08/22/2015   HCT 40.0 08/22/2015   PLT 209 08/22/2015   GLUCOSE 97 09/04/2016   CHOL 204 (H) 09/04/2016   TRIG 217 (H) 09/04/2016   HDL 42 09/04/2016   LDLCALC 119 (H) 09/04/2016   ALT 86 (H) 09/04/2016   AST 67 (H) 09/04/2016   NA 141 09/04/2016   K 3.8 09/04/2016   CL 98 09/04/2016   CREATININE 1.14 (H) 09/04/2016   BUN 15 09/04/2016   CO2 27 09/04/2016   TSH 0.736 09/04/2016   HGBA1C 5.8 (H) 09/04/2016    Assessment & Plan:   Problem List Items Addressed This Visit    Viral illness - Primary    New problem. Exam unremarkable. Influenza negative. Advised supportive care: Tylenol, ibuprofen, rest, fluids      Relevant Orders   POCT Influenza A/B (Completed)      Follow-up: PRN  [place]

## 2016-09-18 NOTE — Assessment & Plan Note (Signed)
New problem. Exam unremarkable. Influenza negative. Advised supportive care: Tylenol, ibuprofen, rest, fluids

## 2016-09-18 NOTE — Patient Instructions (Signed)
This is viral in origin.  Flu negative.  Supportive care: Tylenol, motrin, rest, fluids.  Take care  Dr. Lacinda Axon

## 2016-09-22 ENCOUNTER — Ambulatory Visit (INDEPENDENT_AMBULATORY_CARE_PROVIDER_SITE_OTHER): Payer: 59 | Admitting: Internal Medicine

## 2016-09-22 ENCOUNTER — Encounter: Payer: Self-pay | Admitting: Internal Medicine

## 2016-09-22 VITALS — BP 116/79 | HR 55 | Temp 98.1°F | Resp 14 | Ht 62.0 in | Wt 169.2 lb

## 2016-09-22 DIAGNOSIS — N289 Disorder of kidney and ureter, unspecified: Secondary | ICD-10-CM | POA: Diagnosis not present

## 2016-09-22 DIAGNOSIS — E039 Hypothyroidism, unspecified: Secondary | ICD-10-CM

## 2016-09-22 DIAGNOSIS — Z8371 Family history of colonic polyps: Secondary | ICD-10-CM

## 2016-09-22 DIAGNOSIS — B349 Viral infection, unspecified: Secondary | ICD-10-CM

## 2016-09-22 DIAGNOSIS — I10 Essential (primary) hypertension: Secondary | ICD-10-CM

## 2016-09-22 DIAGNOSIS — Z6830 Body mass index (BMI) 30.0-30.9, adult: Secondary | ICD-10-CM | POA: Diagnosis not present

## 2016-09-22 DIAGNOSIS — R739 Hyperglycemia, unspecified: Secondary | ICD-10-CM

## 2016-09-22 DIAGNOSIS — K760 Fatty (change of) liver, not elsewhere classified: Secondary | ICD-10-CM | POA: Diagnosis not present

## 2016-09-22 DIAGNOSIS — D352 Benign neoplasm of pituitary gland: Secondary | ICD-10-CM | POA: Diagnosis not present

## 2016-09-22 DIAGNOSIS — E7800 Pure hypercholesterolemia, unspecified: Secondary | ICD-10-CM

## 2016-09-22 NOTE — Progress Notes (Signed)
Patient ID: Marie Colon, female   DOB: 22-Nov-1955, 61 y.o.   MRN: [id number]   Subjective:    Patient ID: Marie Colon, female    DOB: 03-Jun-1955, 61 y.o.   MRN: [id number]  HPI  Patient here for a scheduled follow up.  She was seen last week by Dr Lacinda Axon.  Was having fever, body aches and headaches.  Was concerned might have the flu.  Note reviewed.  Supportive care recommended.  She feels better now.  Discussed her lab results.  Tries to stay active.  No chest pain.  No sob.  No acid reflux.  No abdominal pain.  Bowels moving.  Due f/u colonoscopy.     Past Medical History:  Diagnosis Date  . Angiomyolipoma of kidney    evaluated by Dr Bernardo Heater  . Fatty liver   . GERD (gastroesophageal reflux disease)   . Heart murmur    per Dr. Einar Pheasant  . Hypercholesterolemia   . Hypertension   . Hypothyroidism   . Pituitary macroadenoma (HCC)    s/p removal (elevated growth hormone)  . PONV (postoperative nausea and vomiting)   . Tubal pregnancy    s/p rupture  . Wears glasses    Past Surgical History:  Procedure Laterality Date  . ABDOMINAL HYSTERECTOMY  2003   abnormal uterine bleeding  . APPENDECTOMY    . CHOLECYSTECTOMY  2005  . DILATION AND CURETTAGE OF UTERUS    . MASS EXCISION Right 08/29/2015   Procedure: EXCISION OF RIGHT THIGH MASS SUBFASICAL 15 CM;  Surgeon: Stark Klein, MD;  Location: [facility];  Service: General;  Laterality: Right;  . pituitary tumor removal  2006   Family History  Problem Relation Age of Onset  . Brain cancer Father        died - 38  . Diabetes Mother   . Hypothyroidism Sister   . Breast cancer Sister 39  . Sjogren's syndrome Sister   . Breast cancer Sister 34  . Diabetes Unknown        grandmother   Social History   Social History  . Marital status: Married    Spouse name: N/A  . Number of children: 2  . Years of education: N/A   Occupational History  .  [employer]    [employer]   Social History Main Topics  . Smoking status:  Never Smoker  . Smokeless tobacco: Never Used  . Alcohol use No  . Drug use: No  . Sexual activity: Not Asked   Other Topics Concern  . None   Social History Narrative  . None    Outpatient Encounter Prescriptions as of 09/22/2016  Medication Sig  . allopurinol (ZYLOPRIM) 100 MG tablet Take 1 tablet (100 mg total) by mouth daily.  Marland Kitchen ALPRAZolam (XANAX) 0.25 MG tablet Take 1 tablet (0.25 mg total) by mouth 2 (two) times daily as needed for anxiety. (Patient taking differently: Take 0.25 mg by mouth as needed for anxiety. )  . cabergoline (DOSTINEX) 0.5 MG tablet Take 1 tablet by mouth at bedtime.  . colchicine 0.6 MG tablet Take 1 tablet (0.6 mg total) by mouth 2 (two) times daily. (Patient taking differently: Take 0.6 mg by mouth as needed. )  . lansoprazole (PREVACID) 30 MG capsule TAKE 1 CAPSULE BY MOUTH DAILY AT 12 NOON.  Marland Kitchen levothyroxine (SYNTHROID, LEVOTHROID) 112 MCG tablet Take 1 tablet (112 mcg total) by mouth daily before breakfast.  . metoprolol (LOPRESSOR) 50 MG tablet TAKE ONE TABLET  BY MOUTH 2 TIMES A DAY  . Multiple Vitamin (MULTIVITAMIN) tablet Take 1 tablet by mouth daily.  . polyethylene glycol powder (GLYCOLAX/MIRALAX) powder Take with your choice of fluid. This is safe to use on a daily basis if needed.  . pravastatin (PRAVACHOL) 40 MG tablet TAKE 1 TABLET (40 MG TOTAL) BY MOUTH DAILY.  . Probiotic Product (PROBIOTIC DAILY PO) Take 1 capsule by mouth daily.   Marland Kitchen triamterene-hydrochlorothiazide (DYAZIDE) 37.5-25 MG capsule Take 1 each (1 capsule total) by mouth daily.  . [DISCONTINUED] cabergoline (DOSTINEX) 0.5 MG tablet Take 0.5 mg by mouth Nightly.    No facility-administered encounter medications on file as of 09/22/2016.     Review of Systems  Constitutional: Negative for appetite change and unexpected weight change.  HENT: Negative for congestion and sinus pressure.   Respiratory: Negative for cough, chest tightness and shortness of breath.   Cardiovascular:  Negative for chest pain, palpitations and leg swelling.  Gastrointestinal: Negative for abdominal pain, diarrhea, nausea and vomiting.  Genitourinary: Negative for difficulty urinating and dysuria.  Musculoskeletal: Negative for back pain and joint swelling.  Skin: Negative for color change and rash.  Neurological: Negative for dizziness, light-headedness and headaches.  Psychiatric/Behavioral: Negative for agitation and dysphoric mood.       Objective:     Blood pressure rechecked by me:  120/82  Physical Exam  Constitutional: She appears well-developed and well-nourished. No distress.  HENT:  Nose: Nose normal.  Mouth/Throat: Oropharynx is clear and moist.  Neck: Neck supple. No thyromegaly present.  Cardiovascular: Normal rate and regular rhythm.   Pulmonary/Chest: Breath sounds normal. No respiratory distress. She has no wheezes.  Abdominal: Soft. Bowel sounds are normal. There is no tenderness.  Musculoskeletal: She exhibits no edema or tenderness.  Lymphadenopathy:    She has no cervical adenopathy.  Skin: No rash noted. No erythema.  Psychiatric: She has a normal mood and affect. Her behavior is normal.    BP 116/79 (BP Location: Left Arm, Patient Position: Sitting, Cuff Size: Normal)   Pulse (!) 55   Temp 98.1 F (36.7 C) (Oral)   Resp 14   Ht 5\' 2"  (1.575 m)   Wt 169 lb 3.2 oz (76.7 kg)   SpO2 97%   BMI 30.95 kg/m  Wt Readings from Last 3 Encounters:  09/22/16 169 lb 3.2 oz (76.7 kg)  09/18/16 170 lb 8 oz (77.3 kg)  05/12/16 176 lb 3.2 oz (79.9 kg)     Lab Results  Component Value Date   WBC 7.1 08/22/2015   HGB 13.6 08/22/2015   HCT 40.0 08/22/2015   PLT 209 08/22/2015   GLUCOSE 97 09/04/2016   CHOL 204 (H) 09/04/2016   TRIG 217 (H) 09/04/2016   HDL 42 09/04/2016   LDLCALC 119 (H) 09/04/2016   ALT 86 (H) 09/04/2016   AST 67 (H) 09/04/2016   NA 141 09/04/2016   K 3.8 09/04/2016   CL 98 09/04/2016   CREATININE 1.14 (H) 09/04/2016   BUN 15  09/04/2016   CO2 27 09/04/2016   TSH 0.736 09/04/2016   HGBA1C 5.8 (H) 09/04/2016    Mm Screening Breast Tomo Bilateral  Result Date: 02/25/2016 CLINICAL DATA:  Screening. EXAM: 2D DIGITAL SCREENING BILATERAL MAMMOGRAM WITH CAD AND ADJUNCT TOMO COMPARISON:  Previous exam(s). ACR Breast Density Category b: There are scattered areas of fibroglandular density. FINDINGS: There are no findings suspicious for malignancy. Images were processed with CAD. IMPRESSION: No mammographic evidence of malignancy. A result letter of  this screening mammogram will be mailed directly to the patient. RECOMMENDATION: Screening mammogram in one year. (Code:SM-B-01Y) BI-RADS CATEGORY  1: Negative. Electronically Signed   By: Everlean Alstrom M.D.   On: 02/25/2016 08:49       Assessment & Plan:   Problem List Items Addressed This Visit    BMI 30.0-30.9,adult    Discussed diet and exercise.  Follow.        Family history of colonic polyps    Colonoscopy 09/21/12.  Recommended f/u colonoscopy in five years.       Relevant Orders   Ambulatory referral to Gastroenterology   Fatty liver    Followed by GI.  Discussed diet, exercise and weight loss.  Discussed hepatitis vaccines.  Due f/u with GI.  Plans to discuss with them.        Relevant Orders   Ambulatory referral to Gastroenterology   Hepatic function panel   Hypercholesteremia    On pravastatin.  Low cholesterol diet and exercise.  Follow lipid panel and liver function tests.        Relevant Orders   Lipid panel   Hypertension    Blood pressure under good control.  Continue same medication regimen.  Follow pressures.  Follow metabolic panel.        Relevant Orders   TSH   CBC with Differential/Platelet   TSH   Hypothyroidism    On thyroid replacement.  Follow tsh.        Pituitary macroadenoma ([place])    Followed by Dr Gabriel Carina.       Renal insufficiency    Creatinine 1.14 - on recent check.  Follow.  Stable.       Relevant Orders    Basic metabolic panel   Viral illness    Feels better.  No fever.  No headache.         Other Visit Diagnoses    Hyperglycemia    -  Primary   Relevant Orders   Hemoglobin A1c       Einar Pheasant, MD

## 2016-09-22 NOTE — Progress Notes (Signed)
Pre-visit discussion using our clinic review tool. No additional management support is needed unless otherwise documented below in the visit note.  

## 2016-09-23 ENCOUNTER — Encounter: Payer: Self-pay | Admitting: Internal Medicine

## 2016-09-23 NOTE — Assessment & Plan Note (Signed)
On thyroid replacement.  Follow tsh.  

## 2016-09-23 NOTE — Assessment & Plan Note (Signed)
On pravastatin.  Low cholesterol diet and exercise.  Follow lipid panel and liver function tests.   

## 2016-09-23 NOTE — Assessment & Plan Note (Signed)
Creatinine 1.14 - on recent check.  Follow.  Stable.

## 2016-09-23 NOTE — Assessment & Plan Note (Signed)
Discussed diet and exercise.  Follow.  

## 2016-09-23 NOTE — Assessment & Plan Note (Signed)
Followed by Dr. Solum. 

## 2016-09-23 NOTE — Assessment & Plan Note (Signed)
Colonoscopy 09/21/12.  Recommended f/u colonoscopy in five years.

## 2016-09-23 NOTE — Assessment & Plan Note (Signed)
Feels better.  No fever.  No headache.

## 2016-09-23 NOTE — Assessment & Plan Note (Signed)
Blood pressure under good control.  Continue same medication regimen.  Follow pressures.  Follow metabolic panel.   

## 2016-09-23 NOTE — Assessment & Plan Note (Signed)
Followed by GI.  Discussed diet, exercise and weight loss.  Discussed hepatitis vaccines.  Due f/u with GI.  Plans to discuss with them.

## 2016-10-21 DIAGNOSIS — E220 Acromegaly and pituitary gigantism: Secondary | ICD-10-CM | POA: Diagnosis not present

## 2016-10-21 DIAGNOSIS — R748 Abnormal levels of other serum enzymes: Secondary | ICD-10-CM | POA: Diagnosis not present

## 2016-10-21 DIAGNOSIS — E039 Hypothyroidism, unspecified: Secondary | ICD-10-CM | POA: Diagnosis not present

## 2016-10-21 DIAGNOSIS — Z87898 Personal history of other specified conditions: Secondary | ICD-10-CM | POA: Diagnosis not present

## 2016-11-10 ENCOUNTER — Other Ambulatory Visit: Payer: Self-pay | Admitting: Internal Medicine

## 2016-11-24 ENCOUNTER — Other Ambulatory Visit: Payer: Self-pay | Admitting: Gastroenterology

## 2016-11-24 DIAGNOSIS — K760 Fatty (change of) liver, not elsewhere classified: Secondary | ICD-10-CM

## 2016-11-26 ENCOUNTER — Ambulatory Visit
Admission: RE | Admit: 2016-11-26 | Discharge: 2016-11-26 | Disposition: A | Discharge status: Home / Self Care | Payer: 59 | Source: Ambulatory Visit | Attending: Gastroenterology | Admitting: Gastroenterology

## 2016-11-26 DIAGNOSIS — D1771 Benign lipomatous neoplasm of kidney: Secondary | ICD-10-CM | POA: Diagnosis not present

## 2016-11-26 DIAGNOSIS — K760 Fatty (change of) liver, not elsewhere classified: Secondary | ICD-10-CM | POA: Insufficient documentation

## 2016-11-26 DIAGNOSIS — Z9049 Acquired absence of other specified parts of digestive tract: Secondary | ICD-10-CM | POA: Diagnosis not present

## 2016-11-29 DIAGNOSIS — R7989 Other specified abnormal findings of blood chemistry: Secondary | ICD-10-CM | POA: Insufficient documentation

## 2016-12-29 DIAGNOSIS — N289 Disorder of kidney and ureter, unspecified: Secondary | ICD-10-CM | POA: Diagnosis not present

## 2016-12-29 DIAGNOSIS — E7800 Pure hypercholesterolemia, unspecified: Secondary | ICD-10-CM | POA: Diagnosis not present

## 2016-12-29 DIAGNOSIS — K760 Fatty (change of) liver, not elsewhere classified: Secondary | ICD-10-CM | POA: Diagnosis not present

## 2016-12-29 DIAGNOSIS — R739 Hyperglycemia, unspecified: Secondary | ICD-10-CM | POA: Diagnosis not present

## 2016-12-29 DIAGNOSIS — I10 Essential (primary) hypertension: Secondary | ICD-10-CM | POA: Diagnosis not present

## 2016-12-30 ENCOUNTER — Encounter: Payer: Self-pay | Admitting: Internal Medicine

## 2016-12-30 LAB — CBC WITH DIFFERENTIAL/PLATELET
BASOS: 0 %
Basophils Absolute: 0 10*3/uL (ref 0.0–0.2)
EOS (ABSOLUTE): 0.1 10*3/uL (ref 0.0–0.4)
EOS: 2 %
HEMATOCRIT: 39.3 % (ref 34.0–46.6)
Hemoglobin: 14.1 g/dL (ref 11.1–15.9)
IMMATURE GRANULOCYTES: 0 %
Immature Grans (Abs): 0 10*3/uL (ref 0.0–0.1)
Lymphocytes Absolute: 1.5 10*3/uL (ref 0.7–3.1)
Lymphs: 24 %
MCH: 30.7 pg (ref 26.6–33.0)
MCHC: 35.9 g/dL — ABNORMAL HIGH (ref 31.5–35.7)
MCV: 85 fL (ref 79–97)
MONOS ABS: 0.5 10*3/uL (ref 0.1–0.9)
Monocytes: 9 %
NEUTROS ABS: 4.1 10*3/uL (ref 1.4–7.0)
Neutrophils: 65 %
Platelets: 199 10*3/uL (ref 150–379)
RBC: 4.6 x10E6/uL (ref 3.77–5.28)
RDW: 13.7 % (ref 12.3–15.4)
WBC: 6.3 10*3/uL (ref 3.4–10.8)

## 2016-12-30 LAB — BASIC METABOLIC PANEL
BUN / CREAT RATIO: 11 — AB (ref 12–28)
BUN: 13 mg/dL (ref 8–27)
CHLORIDE: 101 mmol/L (ref 96–106)
CO2: 28 mmol/L (ref 20–29)
CREATININE: 1.14 mg/dL — AB (ref 0.57–1.00)
Calcium: 9.5 mg/dL (ref 8.7–10.3)
GFR calc Af Amer: 60 mL/min/{1.73_m2} (ref >59)
GFR calc non Af Amer: 52 mL/min/{1.73_m2} — ABNORMAL LOW (ref >59)
GLUCOSE: 93 mg/dL (ref 65–99)
Potassium: 3.8 mmol/L (ref 3.5–5.2)
SODIUM: 144 mmol/L (ref 134–144)

## 2016-12-30 LAB — LIPID PANEL
Chol/HDL Ratio: 4.5 ratio — ABNORMAL HIGH (ref 0.0–4.4)
Cholesterol, Total: 190 mg/dL (ref 100–199)
HDL: 42 mg/dL (ref >39)
LDL Calculated: 113 mg/dL — ABNORMAL HIGH (ref 0–99)
Triglycerides: 174 mg/dL — ABNORMAL HIGH (ref 0–149)
VLDL CHOLESTEROL CAL: 35 mg/dL (ref 5–40)

## 2016-12-30 LAB — HEPATIC FUNCTION PANEL
ALBUMIN: 4.3 g/dL (ref 3.6–4.8)
ALT: 70 IU/L — ABNORMAL HIGH (ref 0–32)
AST: 55 IU/L — AB (ref 0–40)
Alkaline Phosphatase: 88 IU/L (ref 39–117)
BILIRUBIN, DIRECT: 0.11 mg/dL (ref 0.00–0.40)
Bilirubin Total: 0.3 mg/dL (ref 0.0–1.2)
Total Protein: 6.5 g/dL (ref 6.0–8.5)

## 2016-12-30 LAB — HEMOGLOBIN A1C
Est. average glucose Bld gHb Est-mCnc: 126 mg/dL
Hgb A1c MFr Bld: 6 % — ABNORMAL HIGH (ref 4.8–5.6)

## 2016-12-30 LAB — TSH: TSH: 0.698 u[IU]/mL (ref 0.450–4.500)

## 2017-01-07 ENCOUNTER — Ambulatory Visit (INDEPENDENT_AMBULATORY_CARE_PROVIDER_SITE_OTHER): Payer: 59 | Admitting: Internal Medicine

## 2017-01-07 ENCOUNTER — Encounter: Payer: Self-pay | Admitting: Internal Medicine

## 2017-01-07 VITALS — BP 124/82 | HR 70 | Temp 98.6°F | Ht 61.0 in | Wt 170.8 lb

## 2017-01-07 DIAGNOSIS — M25521 Pain in right elbow: Secondary | ICD-10-CM

## 2017-01-07 DIAGNOSIS — I10 Essential (primary) hypertension: Secondary | ICD-10-CM

## 2017-01-07 DIAGNOSIS — Z1239 Encounter for other screening for malignant neoplasm of breast: Secondary | ICD-10-CM

## 2017-01-07 DIAGNOSIS — K760 Fatty (change of) liver, not elsewhere classified: Secondary | ICD-10-CM | POA: Diagnosis not present

## 2017-01-07 DIAGNOSIS — D352 Benign neoplasm of pituitary gland: Secondary | ICD-10-CM

## 2017-01-07 DIAGNOSIS — Z6832 Body mass index (BMI) 32.0-32.9, adult: Secondary | ICD-10-CM

## 2017-01-07 DIAGNOSIS — M545 Low back pain, unspecified: Secondary | ICD-10-CM

## 2017-01-07 DIAGNOSIS — E7800 Pure hypercholesterolemia, unspecified: Secondary | ICD-10-CM

## 2017-01-07 DIAGNOSIS — Z1231 Encounter for screening mammogram for malignant neoplasm of breast: Secondary | ICD-10-CM | POA: Diagnosis not present

## 2017-01-07 DIAGNOSIS — E039 Hypothyroidism, unspecified: Secondary | ICD-10-CM

## 2017-01-07 DIAGNOSIS — N289 Disorder of kidney and ureter, unspecified: Secondary | ICD-10-CM

## 2017-01-07 NOTE — Patient Instructions (Signed)
Elbow strap.  Let me know if persistent problems.

## 2017-01-07 NOTE — Progress Notes (Addendum)
Patient ID: Marie Colon, female   DOB: [birth year], 61 y.o.   MRN: [id number]   Subjective:    Patient ID: Marie Colon, female    DOB: 1955/06/07, 61 y.o.   MRN: [id number]  HPI  Patient here for a physical exam.  States she is doing relatively well.  No chest pain.  Trying to stay active.  No sob.  No acid reflux.  No abdominal pain.  Bowels moving.  Doing well on benefiber.  No urine change.  She does report pain in her right elbow.  Present for two weeks.  Hurts with lifting objects or rotation of her forearm.  Pain localized to her elbow.  No numbness or tingling.  Also reports left lower back pain/side pain.  Aggravated by vacuuming.  No radiation of pain down leg.  No numbness or tingling.     Past Medical History:  Diagnosis Date  . Angiomyolipoma of kidney    evaluated by Dr Bernardo Heater  . Fatty liver   . GERD (gastroesophageal reflux disease)   . Heart murmur    per Dr. Einar Pheasant  . Hypercholesterolemia   . Hypertension   . Hypothyroidism   . Pituitary macroadenoma (HCC)    s/p removal (elevated growth hormone)  . PONV (postoperative nausea and vomiting)   . Tubal pregnancy    s/p rupture  . Wears glasses    Past Surgical History:  Procedure Laterality Date  . ABDOMINAL HYSTERECTOMY  2003   abnormal uterine bleeding  . APPENDECTOMY    . CHOLECYSTECTOMY  2005  . DILATION AND CURETTAGE OF UTERUS    . MASS EXCISION Right 08/29/2015   Procedure: EXCISION OF RIGHT THIGH MASS SUBFASICAL 15 CM;  Surgeon: Stark Klein, MD;  Location: [place];  Service: General;  Laterality: Right;  . pituitary tumor removal  2006   Family History  Problem Relation Age of Onset  . Brain cancer Father        died - 51  . Diabetes Mother   . Hypothyroidism Sister   . Breast cancer Sister 75  . Sjogren's syndrome Sister   . Breast cancer Sister 80  . Diabetes Unknown        grandmother   Social History   Socioeconomic History  . Marital status: Married    Spouse name: None  .  Number of children: 2  . Years of education: None  . Highest education level: None  Social Needs  . Financial resource strain: None  . Food insecurity - worry: None  . Food insecurity - inability: None  . Transportation needs - medical: None  . Transportation needs - non-medical: None  Occupational History    Employer: [employer]    Comment: [employer] - [employer]  Tobacco Use  . Smoking status: Never Smoker  . Smokeless tobacco: Never Used  Substance and Sexual Activity  . Alcohol use: No    Alcohol/week: 0.0 oz  . Drug use: No  . Sexual activity: None  Other Topics Concern  . None  Social History Narrative  . None    Outpatient Encounter Medications as of 01/07/2017  Medication Sig  . allopurinol (ZYLOPRIM) 100 MG tablet TAKE 1 TABLET BY MOUTH DAILY  . ALPRAZolam (XANAX) 0.25 MG tablet Take 1 tablet (0.25 mg total) by mouth 2 (two) times daily as needed for anxiety. (Patient taking differently: Take 0.25 mg by mouth as needed for anxiety. )  . cabergoline (DOSTINEX) 0.5 MG tablet Take 1 tablet by mouth at  bedtime.  . colchicine 0.6 MG tablet Take 1 tablet (0.6 mg total) by mouth 2 (two) times daily. (Patient taking differently: Take 0.6 mg by mouth as needed. )  . lansoprazole (PREVACID) 30 MG capsule TAKE 1 CAPSULE BY MOUTH DAILY AT 12 NOON.  Marland Kitchen levothyroxine (SYNTHROID, LEVOTHROID) 112 MCG tablet Take 1 tablet (112 mcg total) by mouth daily before breakfast.  . metoprolol (LOPRESSOR) 50 MG tablet TAKE ONE TABLET BY MOUTH 2 TIMES A DAY  . Multiple Vitamin (MULTIVITAMIN) tablet Take 1 tablet by mouth daily.  . polyethylene glycol powder (GLYCOLAX/MIRALAX) powder Take with your choice of fluid. This is safe to use on a daily basis if needed.  . pravastatin (PRAVACHOL) 40 MG tablet TAKE 1 TABLET (40 MG TOTAL) BY MOUTH DAILY.  . Probiotic Product (PROBIOTIC DAILY PO) Take 1 capsule by mouth as needed.   . triamterene-hydrochlorothiazide (DYAZIDE) 37.5-25 MG capsule Take 1 each (1 capsule total)  by mouth daily.   No facility-administered encounter medications on file as of 01/07/2017.     Review of Systems  Constitutional: Negative for appetite change and unexpected weight change.  HENT: Negative for congestion and sinus pressure.   Eyes: Negative for pain and visual disturbance.  Respiratory: Negative for cough, chest tightness and shortness of breath.   Cardiovascular: Negative for chest pain, palpitations and leg swelling.  Gastrointestinal: Negative for abdominal pain, diarrhea, nausea and vomiting.  Genitourinary: Negative for difficulty urinating and dysuria.  Musculoskeletal: Negative for joint swelling and myalgias.       Right elbow pain as outlined.   Skin: Negative for color change and rash.  Neurological: Negative for dizziness, light-headedness and headaches.  Hematological: Negative for adenopathy. Does not bruise/bleed easily.  Psychiatric/Behavioral: Negative for agitation and dysphoric mood.       Objective:    Physical Exam  Constitutional: She is oriented to person, place, and time. She appears well-developed and well-nourished. No distress.  HENT:  Nose: Nose normal.  Mouth/Throat: Oropharynx is clear and moist.  Eyes: Right eye exhibits no discharge. Left eye exhibits no discharge. No scleral icterus.  Neck: Neck supple. No thyromegaly present.  Cardiovascular: Normal rate and regular rhythm.  Pulmonary/Chest: Breath sounds normal. No accessory muscle usage. No tachypnea. No respiratory distress. She has no decreased breath sounds. She has no wheezes. She has no rhonchi. Right breast exhibits no inverted nipple, no mass, no nipple discharge and no tenderness (no axillary adenopathy). Left breast exhibits no inverted nipple, no mass, no nipple discharge and no tenderness (no axilarry adenopathy).  Abdominal: Soft. Bowel sounds are normal. There is no tenderness.  Musculoskeletal: She exhibits no edema or tenderness.  Lymphadenopathy:    She has no  cervical adenopathy.  Neurological: She is alert and oriented to person, place, and time.  Skin: Skin is warm. No rash noted. No erythema.  Psychiatric: She has a normal mood and affect. Her behavior is normal.    BP 124/82 (BP Location: Left Arm, Patient Position: Sitting, Cuff Size: Normal)   Pulse 70   Temp 98.6 F (37 C) (Oral)   Ht 5\' 1"  (1.549 m)   Wt 170 lb 12 oz (77.5 kg)   BMI 32.26 kg/m  Wt Readings from Last 3 Encounters:  01/07/17 170 lb 12 oz (77.5 kg)  09/22/16 169 lb 3.2 oz (76.7 kg)  09/18/16 170 lb 8 oz (77.3 kg)     Lab Results  Component Value Date   WBC 6.3 12/29/2016   HGB 14.1 12/29/2016  HCT 39.3 12/29/2016   PLT 199 12/29/2016   GLUCOSE 93 12/29/2016   CHOL 190 12/29/2016   TRIG 174 (H) 12/29/2016   HDL 42 12/29/2016   LDLCALC 113 (H) 12/29/2016   ALT 70 (H) 12/29/2016   AST 55 (H) 12/29/2016   NA 144 12/29/2016   K 3.8 12/29/2016   CL 101 12/29/2016   CREATININE 1.14 (H) 12/29/2016   BUN 13 12/29/2016   CO2 28 12/29/2016   TSH 0.698 12/29/2016   HGBA1C 6.0 (H) 12/29/2016    US Abdomen Complete  Result Date: 11/26/2016 CLINICAL DATA:  Fatty liver disease, follow-up study. History of previous laparoscopic cholecystectomy. EXAM: ABDOMEN ULTRASOUND COMPLETE COMPARISON:  Abdominal ultrasound of September 11, 2014 FINDINGS: Gallbladder: The gallbladder is surgically absent. Common bile duct: Diameter: 4.5 mm Liver: The hepatic echotexture is increased. The surface contour of the organ is smooth. There is no focal mass nor ductal dilation. Portal vein is patent on color Doppler imaging with normal direction of blood flow towards the liver. IVC: No abnormality visualized. Pancreas: There is limited visualization of the pancreatic head and tail. The pancreatic body is grossly normal. Spleen: Size and appearance within normal limits. Right Kidney: Length: 10.8 cm. There is a midpole cyst measuring 9 mm in diameter. There is a cortically based midpole  hyperechoic nonshadowing focus measuring 7 mm in diameter. There is no hydronephrosis. The renal cortical echotexture elsewhere is normal. Left Kidney: Length: 9.4 cm. Echogenicity within normal limits. No mass or hydronephrosis visualized. Abdominal aorta: No aneurysm visualized. Other findings: None. IMPRESSION: Previous cholecystectomy. Fatty infiltrative change of the liver. No suspicious hepatic masses. 7 mm angiomyolipoma in the midpole of the right kidney which is stable. Electronically Signed   By: David  Martinique M.D.   On: 11/26/2016 10:16       Assessment & Plan:   Problem List Items Addressed This Visit    BMI 32.0-32.9,adult    Discussed diet and exercise.  Follow.        Fatty liver    Followed by GI.  Discussed diet and exercise and weight loss.        Hypercholesteremia    On pravastatin.  Low cholesterol diet and exercise.  Follow lipid panel and liver function tests.        Hypertension    Blood pressure under good control.  Continue same medication regimen.  Follow pressures.  Follow metabolic panel.        Hypothyroidism    On thyroid replacement.  Follow tsh.       Pituitary macroadenoma ([place])    Followed by Dr Gabriel Carina.        Renal insufficiency    Creatinine stable.  Avoid antiinflammatories.  Follow metabolic panel.         Other Visit Diagnoses    Breast cancer screening    -  Primary   Relevant Orders   MM DIGITAL SCREENING BILATERAL   Right elbow pain       Persistent.  elbow strapy.  follow.  may need ortho referral.    Left-sided low back pain without sciatica, unspecified chronicity       Pain as outlined.  No abnormality noted on exam.  Declines xray.  stretches.  Tylenol if needed.  Follow.  notify me if persistent.        Einar Pheasant, MD

## 2017-01-10 ENCOUNTER — Encounter: Payer: Self-pay | Admitting: Internal Medicine

## 2017-01-10 NOTE — Assessment & Plan Note (Signed)
On thyroid replacement.  Follow tsh.  

## 2017-01-10 NOTE — Assessment & Plan Note (Signed)
Followed by GI.  Discussed diet and exercise and weight loss.

## 2017-01-10 NOTE — Assessment & Plan Note (Signed)
Blood pressure under good control.  Continue same medication regimen.  Follow pressures.  Follow metabolic panel.   

## 2017-01-10 NOTE — Assessment & Plan Note (Signed)
Followed by Dr. Solum. 

## 2017-01-10 NOTE — Assessment & Plan Note (Signed)
Discussed diet and exercise.  Follow.  

## 2017-01-10 NOTE — Addendum Note (Signed)
Addended by: Alisa Graff on: 01/10/2017 05:32 PM   Modules accepted: Level of Service

## 2017-01-10 NOTE — Assessment & Plan Note (Signed)
On pravastatin.  Low cholesterol diet and exercise.  Follow lipid panel and liver function tests.   

## 2017-01-10 NOTE — Assessment & Plan Note (Signed)
Creatinine stable.  Avoid antiinflammatories.  Follow metabolic panel.   

## 2017-01-13 ENCOUNTER — Other Ambulatory Visit: Payer: Self-pay | Admitting: Internal Medicine

## 2017-02-12 ENCOUNTER — Ambulatory Visit: Payer: Self-pay

## 2017-02-12 ENCOUNTER — Telehealth: Payer: 59 | Admitting: Family

## 2017-02-12 ENCOUNTER — Encounter: Payer: Self-pay | Admitting: Internal Medicine

## 2017-02-12 DIAGNOSIS — J019 Acute sinusitis, unspecified: Secondary | ICD-10-CM

## 2017-02-12 MED ORDER — DOXYCYCLINE HYCLATE 100 MG PO TABS: 100.0000 mg | ORAL_TABLET | Freq: Two times a day (BID) | ORAL | 0 refills | Status: DC

## 2017-02-12 NOTE — Telephone Encounter (Signed)
Yes can continue with my instructions and tell her to let me know if needs to be seen.

## 2017-02-12 NOTE — Telephone Encounter (Signed)
MyChart message sent to patient.

## 2017-02-12 NOTE — Telephone Encounter (Signed)
Please advise 

## 2017-02-12 NOTE — Progress Notes (Signed)

## 2017-02-12 NOTE — Telephone Encounter (Signed)
   Reason for Disposition . SEVERE coughing spells (e.g., whooping sound after coughing, vomiting after coughing)  Answer Assessment - Initial Assessment Questions 1. ONSET: "When did the cough begin?"      Started yesterday 2. SEVERITY: "How bad is the cough today?"      Mild 3. RESPIRATORY DISTRESS: "Describe your breathing."      No distress 4. FEVER: "Do you have a fever?" If so, ask: "What is your temperature, how was it measured, and when did it start?"     No 5. HEMOPTYSIS: "Are you coughing up any blood?" If so ask: "How much?" (flecks, streaks, tablespoons, etc.)     No 6. TREATMENT: "What have you done so far to treat the cough?" (e.g., meds, fluids, humidifier)     Delsym 7. CARDIAC HISTORY: "Do you have any history of heart disease?" (e.g., heart attack, congestive heart failure)      High blood pressure 8. LUNG HISTORY: "Do you have any history of lung disease?"  (e.g., pulmonary embolus, asthma, emphysema)     No 9. PE RISK FACTORS: "Do you have a history of blood clots?" (or: recent major surgery, recent prolonged travel, bedridden )     No 10. OTHER SYMPTOMS: "Do you have any other symptoms? (e.g., runny nose, wheezing, chest pain)       No 11. PREGNANCY: "Is there any chance you are pregnant?" "When was your last menstrual period?"       No 12. TRAVEL: "Have you traveled out of the country in the last month?" (e.g., travel history, exposures)       No  Protocols used: COUGH - ACUTE NON-PRODUCTIVE-A-AH  Right ear has pressure, has facial pain.

## 2017-02-12 NOTE — Telephone Encounter (Signed)
With ear pressure and facial pain with minimal cough. Can take nasacort nasal spray - 2 sprays each nostril one time per day.  Do this in the evening.  Saline nasal spray - flush nose at least 2-3x/day.  mucinex daily as directed.  If persistent symptoms, will need to be seen.  Let me know if I need to work in.

## 2017-02-12 NOTE — Telephone Encounter (Signed)
Patient had an e-visit this afternoon and was given doxycycline. Do you want her to continue with your instructions as well or disregard?

## 2017-02-25 ENCOUNTER — Ambulatory Visit
Admission: RE | Admit: 2017-02-25 | Discharge: 2017-02-25 | Disposition: A | Discharge status: Home / Self Care | Payer: 59 | Source: Ambulatory Visit | Attending: Internal Medicine | Admitting: Internal Medicine

## 2017-02-25 DIAGNOSIS — Z1239 Encounter for other screening for malignant neoplasm of breast: Secondary | ICD-10-CM

## 2017-02-25 DIAGNOSIS — Z1231 Encounter for screening mammogram for malignant neoplasm of breast: Secondary | ICD-10-CM | POA: Diagnosis not present

## 2017-03-10 ENCOUNTER — Other Ambulatory Visit: Payer: Self-pay | Admitting: Internal Medicine

## 2017-03-17 ENCOUNTER — Encounter: Payer: Self-pay | Admitting: *Deleted

## 2017-03-18 ENCOUNTER — Encounter
Admission: RE | Disposition: A | Discharge status: Home / Self Care | Payer: Self-pay | Source: Ambulatory Visit | Attending: Gastroenterology

## 2017-03-18 ENCOUNTER — Encounter: Payer: Self-pay | Admitting: *Deleted

## 2017-03-18 ENCOUNTER — Ambulatory Visit: Payer: 59 | Admitting: Anesthesiology

## 2017-03-18 ENCOUNTER — Ambulatory Visit
Admission: RE | Admit: 2017-03-18 | Discharge: 2017-03-18 | Disposition: A | Discharge status: Home / Self Care | Payer: 59 | Source: Ambulatory Visit | Attending: Gastroenterology | Admitting: Gastroenterology

## 2017-03-18 DIAGNOSIS — Z79899 Other long term (current) drug therapy: Secondary | ICD-10-CM | POA: Diagnosis not present

## 2017-03-18 DIAGNOSIS — Z880 Allergy status to penicillin: Secondary | ICD-10-CM | POA: Insufficient documentation

## 2017-03-18 DIAGNOSIS — Z7982 Long term (current) use of aspirin: Secondary | ICD-10-CM | POA: Diagnosis not present

## 2017-03-18 DIAGNOSIS — D124 Benign neoplasm of descending colon: Secondary | ICD-10-CM | POA: Insufficient documentation

## 2017-03-18 DIAGNOSIS — Z888 Allergy status to other drugs, medicaments and biological substances status: Secondary | ICD-10-CM | POA: Insufficient documentation

## 2017-03-18 DIAGNOSIS — R011 Cardiac murmur, unspecified: Secondary | ICD-10-CM | POA: Insufficient documentation

## 2017-03-18 DIAGNOSIS — K219 Gastro-esophageal reflux disease without esophagitis: Secondary | ICD-10-CM | POA: Insufficient documentation

## 2017-03-18 DIAGNOSIS — E7800 Pure hypercholesterolemia, unspecified: Secondary | ICD-10-CM | POA: Diagnosis not present

## 2017-03-18 DIAGNOSIS — D126 Benign neoplasm of colon, unspecified: Secondary | ICD-10-CM | POA: Diagnosis not present

## 2017-03-18 DIAGNOSIS — E039 Hypothyroidism, unspecified: Secondary | ICD-10-CM | POA: Insufficient documentation

## 2017-03-18 DIAGNOSIS — Z1211 Encounter for screening for malignant neoplasm of colon: Secondary | ICD-10-CM | POA: Diagnosis not present

## 2017-03-18 DIAGNOSIS — K760 Fatty (change of) liver, not elsewhere classified: Secondary | ICD-10-CM | POA: Insufficient documentation

## 2017-03-18 DIAGNOSIS — I10 Essential (primary) hypertension: Secondary | ICD-10-CM | POA: Diagnosis not present

## 2017-03-18 DIAGNOSIS — Z8371 Family history of colonic polyps: Secondary | ICD-10-CM | POA: Diagnosis not present

## 2017-03-18 DIAGNOSIS — K635 Polyp of colon: Secondary | ICD-10-CM | POA: Diagnosis not present

## 2017-03-18 HISTORY — DX: Personal history of other diseases of urinary system: Z87.448

## 2017-03-18 HISTORY — DX: Personal history of other diseases of the digestive system: Z87.19

## 2017-03-18 HISTORY — DX: Acromegaly and pituitary gigantism: E22.0

## 2017-03-18 HISTORY — PX: COLONOSCOPY WITH PROPOFOL: SHX5780

## 2017-03-18 SURGERY — COLONOSCOPY WITH PROPOFOL
Anesthesia: General

## 2017-03-18 MED ORDER — PHENYLEPHRINE HCL 10 MG/ML IJ SOLN
INTRAMUSCULAR | Status: DC | PRN
  Administered 2017-03-18: 100 ug via INTRAVENOUS

## 2017-03-18 MED ORDER — SODIUM CHLORIDE 0.9 % IV SOLN
INTRAVENOUS | Status: DC
  Administered 2017-03-18: 1000 mL via INTRAVENOUS

## 2017-03-18 MED ORDER — PHENYLEPHRINE HCL 10 MG/ML IJ SOLN
INTRAMUSCULAR | Status: AC
  Filled 2017-03-18: qty 1

## 2017-03-18 MED ORDER — LIDOCAINE HCL (CARDIAC) 20 MG/ML IV SOLN
INTRAVENOUS | Status: DC | PRN
  Administered 2017-03-18: 50 mg via INTRATRACHEAL

## 2017-03-18 MED ORDER — SODIUM CHLORIDE 0.9 % IV SOLN: INTRAVENOUS | Status: DC

## 2017-03-18 MED ORDER — LIDOCAINE HCL (PF) 2 % IJ SOLN
INTRAMUSCULAR | Status: AC
  Filled 2017-03-18: qty 10

## 2017-03-18 MED ORDER — PROPOFOL 10 MG/ML IV BOLUS
INTRAVENOUS | Status: DC | PRN
  Administered 2017-03-18: 100 mg via INTRAVENOUS

## 2017-03-18 MED ORDER — PROPOFOL 10 MG/ML IV BOLUS
INTRAVENOUS | Status: AC
  Filled 2017-03-18: qty 40

## 2017-03-18 MED ORDER — PROPOFOL 500 MG/50ML IV EMUL
INTRAVENOUS | Status: DC | PRN
  Administered 2017-03-18: 150 ug/kg/min via INTRAVENOUS

## 2017-03-18 MED ORDER — GLYCOPYRROLATE 0.2 MG/ML IJ SOLN
INTRAMUSCULAR | Status: AC
  Filled 2017-03-18: qty 1

## 2017-03-18 NOTE — Anesthesia Postprocedure Evaluation (Signed)
Anesthesia Post Note  Patient: Marie Colon  Procedure(s) Performed: COLONOSCOPY WITH PROPOFOL (N/A )  Patient location during evaluation: Endoscopy Anesthesia Type: General Level of consciousness: awake and alert Pain management: pain level controlled Vital Signs Assessment: post-procedure vital signs reviewed and stable Respiratory status: spontaneous breathing and respiratory function stable Cardiovascular status: stable Anesthetic complications: no     Last Vitals:  Vitals:   03/18/17 0806 03/18/17 0813  BP: 119/68 110/62  Pulse: (!) 55 65  Resp:  15  Temp: (!) 36 C (!) 36.1 C  SpO2: 98% 98%    Last Pain:  Vitals:   03/18/17 0813  TempSrc: Tympanic                 KEPHART,WILLIAM K

## 2017-03-18 NOTE — H&P (Signed)
Outpatient short stay form Pre-procedure 03/18/2017 7:36 AM Lollie Sails MD  Primary Physician: Dr. Einar Pheasant  Reason for visit: Colonoscopy  History of present illness: Patient is a 62 year old female presenting today as above.  She tolerated her prep well.  She does take a daily aspirin that was held yesterday and today.  She takes no other blood thinning agent.  She takes no other aspirin products.    Current Facility-Administered Medications:  .  0.9 %  sodium chloride infusion, , Intravenous, Continuous, Lollie Sails, MD, Last Rate: 20 mL/hr at 03/18/17 0713, 1,000 mL at 03/18/17 0713 .  0.9 %  sodium chloride infusion, , Intravenous, Continuous, Lollie Sails, MD  Medications Prior to Admission  Medication Sig Dispense Refill Last Dose  . allopurinol (ZYLOPRIM) 100 MG tablet TAKE 1 TABLET BY MOUTH DAILY 90 tablet 0 03/17/2017 at Unknown time  . ALPRAZolam (XANAX) 0.25 MG tablet Take 1 tablet (0.25 mg total) by mouth 2 (two) times daily as needed for anxiety. (Patient taking differently: Take 0.25 mg by mouth as needed for anxiety. ) 20 tablet 0 03/17/2017 at Unknown time  . aspirin EC 81 MG tablet Take 81 mg by mouth daily.   Past Week at Unknown time  . cabergoline (DOSTINEX) 0.5 MG tablet Take 1 tablet by mouth at bedtime.   03/17/2017 at Unknown time  . colchicine 0.6 MG tablet Take 1 tablet (0.6 mg total) by mouth 2 (two) times daily. (Patient taking differently: Take 0.6 mg by mouth as needed. ) 60 tablet 0 03/17/2017 at Unknown time  . doxycycline (VIBRA-TABS) 100 MG tablet Take 1 tablet (100 mg total) by mouth 2 (two) times daily. 20 tablet 0 03/17/2017 at Unknown time  . lansoprazole (PREVACID) 30 MG capsule TAKE 1 CAPSULE BY MOUTH DAILY AT 12 NOON. 90 capsule 1 03/17/2017 at Unknown time  . lansoprazole (PREVACID) 30 MG capsule TAKE 1 CAPSULE BY MOUTH DAILY AT 12 NOON. 90 capsule 1 03/17/2017 at Unknown time  . levothyroxine (SYNTHROID, LEVOTHROID) 112 MCG tablet  Take 1 tablet (112 mcg total) by mouth daily before breakfast. 90 tablet 1 03/18/2017 at 0500  . metoprolol tartrate (LOPRESSOR) 50 MG tablet TAKE ONE TABLET BY MOUTH 2 TIMES A DAY 180 tablet 3 03/18/2017 at 0500  . Multiple Vitamin (MULTIVITAMIN) tablet Take 1 tablet by mouth daily.   Past Week at Unknown time  . polyethylene glycol powder (GLYCOLAX/MIRALAX) powder Take with your choice of fluid. This is safe to use on a daily basis if needed. 3350 g 1 03/17/2017 at Unknown time  . triamterene-hydrochlorothiazide (DYAZIDE) 37.5-25 MG capsule TAKE 1 CAPSULE BY MOUTH DAILY. 90 capsule 3 03/18/2017 at 0500  . pravastatin (PRAVACHOL) 40 MG tablet TAKE 1 TABLET BY MOUTH DAILY 90 tablet 1   . Probiotic Product (PROBIOTIC DAILY PO) Take 1 capsule by mouth as needed.    Not Taking at Unknown time     Allergies  Allergen Reactions  . Protonix [Pantoprazole] Other (See Comments)    Headaches   . Penicillins Rash    Has patient had a PCN reaction causing immediate rash, facial/tongue/throat swelling, SOB or lightheadedness with hypotension: Yes Has patient had a PCN reaction causing severe rash involving mucus membranes or skin necrosis: Yes Has patient had a PCN reaction that required hospitalization No Has patient had a PCN reaction occurring within the last 10 years: Yes If all of the above answers are "NO", then may proceed with Cephalosporin use.  Past Medical History:  Diagnosis Date  . Acromegaly ([place])   . Angiomyolipoma of kidney    evaluated by Dr Bernardo Heater  . Fatty liver   . GERD (gastroesophageal reflux disease)   . Heart murmur    per Dr. Einar Pheasant  . History of pyelonephritis   . History of ulcerative colitis   . Hypercholesterolemia   . Hypertension   . Hypothyroidism   . Pituitary macroadenoma ([facility])    s/p removal (elevated growth hormone)  . PONV (postoperative nausea and vomiting)   . Tubal pregnancy    s/p rupture  . Wears glasses     Review of systems:       Physical Exam    Heart and lungs: Regular rate and rhythm without rub or gallop, lungs are bilaterally clear.    HEENT: Normocephalic atraumatic eyes are anicteric    Other:    Pertinant exam for procedure: Soft nontender nondistended bowel sounds positive normoactive    Planned proceedures: Colonoscopy and indicated procedures. I have discussed the risks benefits and complications of procedures to include not limited to bleeding, infection, perforation and the risk of sedation and the patient wishes to proceed.    Lollie Sails, MD Gastroenterology 03/18/2017  7:36 AM

## 2017-03-18 NOTE — Anesthesia Post-op Follow-up Note (Signed)
Anesthesia QCDR form completed.        

## 2017-03-18 NOTE — Transfer of Care (Signed)
Immediate Anesthesia Transfer of Care Note  Patient: Marie Colon  Procedure(s) Performed: COLONOSCOPY WITH PROPOFOL (N/A )  Patient Location: PACU  Anesthesia Type:General  Level of Consciousness: drowsy  Airway & Oxygen Therapy: Patient Spontanous Breathing  Post-op Assessment: Report given to RN, Post -op Vital signs reviewed and stable and Patient moving all extremities X 4  Post vital signs: Reviewed and stable  Last Vitals:  Vitals:   03/18/17 0657 03/18/17 0806  BP: 131/74 119/68  Pulse: 62 (!) 55  Resp: 16   Temp: 36.8 C (!) 36 C  SpO2: 95% 98%    Last Pain:  Vitals:   03/18/17 0806  TempSrc: Skin         Complications: No apparent anesthesia complications

## 2017-03-18 NOTE — Op Note (Signed)
[facility]astroenterology Patient Name: Marie Colon Procedure Date: 03/18/2017 7:37 AM MRN: [id number] Account #: [id number] Date of Birth: [birth year] Admit Type: Outpatient Age: 62 Room: [facility] ENDO ROOM 1 Gender: Female Note Status: Finalized Procedure:            Colonoscopy Indications:          Family history of colonic polyps in a first-degree                        relative Providers:            Lollie Sails, MD Referring MD:         Einar Pheasant, MD (Referring MD) Medicines:            Monitored Anesthesia Care Complications:        No immediate complications. Procedure:            Pre-Anesthesia Assessment:                       - ASA Grade Assessment: II - A patient with mild                        systemic disease.                       After obtaining informed consent, the colonoscope was                        passed under direct vision. Throughout the procedure,                        the patient's blood pressure, pulse, and oxygen                        saturations were monitored continuously. The                        Colonoscope was introduced through the anus and                        advanced to the the cecum, identified by appendiceal                        orifice and ileocecal valve. The colonoscopy was                        performed without difficulty. The patient tolerated the                        procedure well. The quality of the bowel preparation                        was good. Findings:      A 2 mm polyp was found in the proximal descending colon. The polyp was       sessile. The polyp was removed with a cold biopsy forceps. Resection and       retrieval were complete.      The exam was otherwise normal throughout the examined colon.      The retroflexed view of the distal rectum and anal verge was normal and  showed no anal or rectal abnormalities.      The digital rectal exam was normal. Impression:            - One 2 mm polyp in the proximal descending colon,                        removed with a cold biopsy forceps. Resected and                        retrieved.                       - The distal rectum and anal verge are normal on                        retroflexion view. Recommendation:       - Discharge patient to home.                       - Discharge patient to home.                       - Await pathology results.                       - Telephone GI clinic for pathology results in 1 week. Procedure Code(s):    --- Professional ---                       [phone], Colonoscopy, flexible; with biopsy, single or                        multiple Diagnosis Code(s):    --- Professional ---                       D12.4, Benign neoplasm of descending colon                       Z83.71, Family history of colonic polyps CPT copyright 2016 American Medical Association. All rights reserved. The codes documented in this report are preliminary and upon coder review may  be revised to meet current compliance requirements. Lollie Sails, MD 03/18/2017 8:07:57 AM This report has been signed electronically. Number of Addenda: 0 Note Initiated On: 03/18/2017 7:37 AM Scope Withdrawal Time: 0 hours 9 minutes 23 seconds  Total Procedure Duration: 0 hours 20 minutes 39 seconds       [facility]

## 2017-03-18 NOTE — Anesthesia Postprocedure Evaluation (Signed)
Anesthesia Post Note  Patient: Marie Colon  Procedure(s) Performed: COLONOSCOPY WITH PROPOFOL (N/A )  Patient location during evaluation: PACU Anesthesia Type: General Level of consciousness: awake and awake and alert Pain management: satisfactory to patient Vital Signs Assessment: post-procedure vital signs reviewed and stable Respiratory status: spontaneous breathing Cardiovascular status: blood pressure returned to baseline Postop Assessment: no backache and no headache Anesthetic complications: no     Last Vitals:  Vitals:   03/18/17 0657 03/18/17 0806  BP: 131/74 119/68  Pulse: 62 (!) 55  Resp: 16   Temp: 36.8 C (!) 36 C  SpO2: 95% 98%    Last Pain:  Vitals:   03/18/17 0806  TempSrc: Skin                 Lakeyia Surber H Vincente Asbridge

## 2017-03-18 NOTE — Anesthesia Preprocedure Evaluation (Signed)
Anesthesia Evaluation  Patient identified by MRN, date of birth, ID band Patient awake    Reviewed: Allergy & Precautions, NPO status , Patient's Chart, lab work & pertinent test results, reviewed documented beta blocker date and time   History of Anesthesia Complications (+) PONV  Airway Mallampati: III       Dental   Pulmonary neg sleep apnea, neg COPD,           Cardiovascular hypertension, Pt. on medications and Pt. on home beta blockers (-) Past MI and (-) CHF (-) dysrhythmias + Valvular Problems/Murmurs (murmur, no tx)      Neuro/Psych neg Seizures    GI/Hepatic Neg liver ROS, GERD  Medicated,  Endo/Other  neg diabetesHypothyroidism   Renal/GU Renal InsufficiencyRenal disease     Musculoskeletal   Abdominal   Peds  Hematology   Anesthesia Other Findings   Reproductive/Obstetrics                            Anesthesia Physical Anesthesia Plan  ASA: II  Anesthesia Plan: General   Post-op Pain Management:    Induction: Intravenous  PONV Risk Score and Plan: 4 or greater and TIVA, Propofol infusion, Ondansetron and Midazolam  Airway Management Planned: Nasal Cannula  Additional Equipment:   Intra-op Plan:   Post-operative Plan:   Informed Consent: I have reviewed the patients History and Physical, chart, labs and discussed the procedure including the risks, benefits and alternatives for the proposed anesthesia with the patient or authorized representative who has indicated his/her understanding and acceptance.     Plan Discussed with:   Anesthesia Plan Comments:         Anesthesia Quick Evaluation

## 2017-03-19 LAB — SURGICAL PATHOLOGY

## 2017-04-02 DIAGNOSIS — E220 Acromegaly and pituitary gigantism: Secondary | ICD-10-CM | POA: Diagnosis not present

## 2017-04-02 DIAGNOSIS — K760 Fatty (change of) liver, not elsewhere classified: Secondary | ICD-10-CM | POA: Diagnosis not present

## 2017-04-02 DIAGNOSIS — E039 Hypothyroidism, unspecified: Secondary | ICD-10-CM | POA: Diagnosis not present

## 2017-04-13 DIAGNOSIS — E220 Acromegaly and pituitary gigantism: Secondary | ICD-10-CM | POA: Diagnosis not present

## 2017-04-13 DIAGNOSIS — E039 Hypothyroidism, unspecified: Secondary | ICD-10-CM | POA: Diagnosis not present

## 2017-04-13 DIAGNOSIS — Z87898 Personal history of other specified conditions: Secondary | ICD-10-CM | POA: Diagnosis not present

## 2017-04-13 DIAGNOSIS — R748 Abnormal levels of other serum enzymes: Secondary | ICD-10-CM | POA: Diagnosis not present

## 2017-04-23 ENCOUNTER — Encounter: Payer: Self-pay | Admitting: Internal Medicine

## 2017-05-11 ENCOUNTER — Ambulatory Visit: Payer: 59 | Admitting: Internal Medicine

## 2017-05-11 ENCOUNTER — Encounter: Payer: Self-pay | Admitting: Internal Medicine

## 2017-05-11 VITALS — BP 112/70 | HR 53 | Temp 98.2°F | Resp 18 | Wt 176.2 lb

## 2017-05-11 DIAGNOSIS — I10 Essential (primary) hypertension: Secondary | ICD-10-CM | POA: Diagnosis not present

## 2017-05-11 DIAGNOSIS — Z803 Family history of malignant neoplasm of breast: Secondary | ICD-10-CM

## 2017-05-11 DIAGNOSIS — K760 Fatty (change of) liver, not elsewhere classified: Secondary | ICD-10-CM | POA: Diagnosis not present

## 2017-05-11 DIAGNOSIS — N289 Disorder of kidney and ureter, unspecified: Secondary | ICD-10-CM

## 2017-05-11 DIAGNOSIS — E7800 Pure hypercholesterolemia, unspecified: Secondary | ICD-10-CM

## 2017-05-11 DIAGNOSIS — Z6833 Body mass index (BMI) 33.0-33.9, adult: Secondary | ICD-10-CM

## 2017-05-11 DIAGNOSIS — R739 Hyperglycemia, unspecified: Secondary | ICD-10-CM | POA: Diagnosis not present

## 2017-05-11 DIAGNOSIS — D352 Benign neoplasm of pituitary gland: Secondary | ICD-10-CM | POA: Diagnosis not present

## 2017-05-11 DIAGNOSIS — E039 Hypothyroidism, unspecified: Secondary | ICD-10-CM

## 2017-05-11 NOTE — Progress Notes (Signed)
Patient ID: Marie Colon, female   DOB: Sep 24, 1955, 62 y.o.   MRN: [id number]   Subjective:    Patient ID: Marie Colon, female    DOB: [birth year], 62 y.o.   MRN: [id number]  HPI  Patient here for a scheduled follow up.  Saw endocrinology for f/u - acromegaly 03/2017.  Stable.  Recommended f/u in 6 months.  She is also followed by GI - elevated liver enzymes/fatty liver.  Stable.  Discussed diet and exercise.  No chest pain.  No sob.  No acid reflux.  No abdominal pain.  Bowels moving.  Handling stress.  Overall feels good.     Past Medical History:  Diagnosis Date  . Acromegaly ([place])   . Angiomyolipoma of kidney    evaluated by Dr Bernardo Heater  . Fatty liver   . GERD (gastroesophageal reflux disease)   . Heart murmur    per Dr. Einar Pheasant  . History of pyelonephritis   . History of ulcerative colitis   . Hypercholesterolemia   . Hypertension   . Hypothyroidism   . Pituitary macroadenoma ([facility])    s/p removal (elevated growth hormone)  . Tubal pregnancy    s/p rupture  . Wears glasses    Past Surgical History:  Procedure Laterality Date  . ABDOMINAL HYSTERECTOMY  2003   abnormal uterine bleeding  . APPENDECTOMY    . CHOLECYSTECTOMY  2005  . COLONOSCOPY WITH PROPOFOL N/A 03/18/2017   Procedure: COLONOSCOPY WITH PROPOFOL;  Surgeon: Lollie Sails, MD;  Location: [facility] ENDOSCOPY;  Service: Endoscopy;  Laterality: N/A;  . DILATION AND CURETTAGE OF UTERUS    . MASS EXCISION Right 08/29/2015   Procedure: EXCISION OF RIGHT THIGH MASS SUBFASICAL 15 CM;  Surgeon: Stark Klein, MD;  Location: [facility];  Service: General;  Laterality: Right;  . pituitary tumor removal  2006   Family History  Problem Relation Age of Onset  . Brain cancer Father        died - 60  . Diabetes Mother   . Hypothyroidism Sister   . Breast cancer Sister 38  . Sjogren's syndrome Sister   . Breast cancer Sister 48  . Diabetes Unknown        grandmother   Social History   Socioeconomic History  .  Marital status: Married    Spouse name: Not on file  . Number of children: 2  . Years of education: Not on file  . 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 Financial resource strain: Not on file  . Food insecurity:    Worry: Not on file    Inability: Not on file  . Transportation needs:    Medical: Not on file    Non-medical: Not on file  Tobacco Use  . Smoking status: Never Smoker  . Smokeless tobacco: Never Used  Substance and Sexual Activity  . Alcohol use: No    Alcohol/week: 0.0 oz  . Drug use: No  . Sexual activity: Not on file  Lifestyle  . Physical activity:    Days per week: Not on file    Minutes per session: Not on file  . Stress: Not on file  Relationships  . Social connections:    Talks on phone: Not on file    Gets together: Not on file    Attends religious service: Not on file    Active member of  club or organization: Not on file    Attends meetings of clubs or organizations: Not on file    Relationship status: Not on file  Other Topics Concern  . Not on file  Social History Narrative  . Not on file    Outpatient Encounter Medications as of 05/11/2017  Medication Sig  . allopurinol (ZYLOPRIM) 100 MG tablet TAKE 1 TABLET BY MOUTH DAILY  . ALPRAZolam (XANAX) 0.25 MG tablet Take 1 tablet (0.25 mg total) by mouth 2 (two) times daily as needed for anxiety. (Patient taking differently: Take 0.25 mg by mouth as needed for anxiety. )  . aspirin EC 81 MG tablet Take 81 mg by mouth daily.  . cabergoline (DOSTINEX) 0.5 MG tablet Take 1 tablet by mouth at bedtime.  . colchicine 0.6 MG tablet Take 1 tablet (0.6 mg total) by mouth 2 (two) times daily. (Patient taking differently: Take 0.6 mg by mouth as needed. )  . doxycycline (VIBRA-TABS) 100 MG tablet Take 1 tablet (100 mg total) by mouth 2 (two) times daily.  . lansoprazole (PREVACID) 30 MG capsule TAKE 1 CAPSULE BY MOUTH DAILY AT 12  NOON.  Marland Kitchen levothyroxine (SYNTHROID, LEVOTHROID) 112 MCG tablet Take 1 tablet (112 mcg total) by mouth daily before breakfast.  . metoprolol tartrate (LOPRESSOR) 50 MG tablet TAKE ONE TABLET BY MOUTH 2 TIMES A DAY  . Multiple Vitamin (MULTIVITAMIN) tablet Take 1 tablet by mouth daily.  . polyethylene glycol powder (GLYCOLAX/MIRALAX) powder Take with your choice of fluid. This is safe to use on a daily basis if needed.  . pravastatin (PRAVACHOL) 40 MG tablet TAKE 1 TABLET BY MOUTH DAILY  . triamterene-hydrochlorothiazide (DYAZIDE) 37.5-25 MG capsule TAKE 1 CAPSULE BY MOUTH DAILY.  . [DISCONTINUED] lansoprazole (PREVACID) 30 MG capsule TAKE 1 CAPSULE BY MOUTH DAILY AT 12 NOON.  . [DISCONTINUED] Probiotic Product (PROBIOTIC DAILY PO) Take 1 capsule by mouth as needed.    No facility-administered encounter medications on file as of 05/11/2017.     Review of Systems  Constitutional: Negative for appetite change and unexpected weight change.  HENT: Negative for congestion and sinus pressure.   Respiratory: Negative for cough, chest tightness and shortness of breath.   Cardiovascular: Negative for chest pain, palpitations and leg swelling.  Gastrointestinal: Negative for abdominal pain, diarrhea, nausea and vomiting.  Genitourinary: Negative for difficulty urinating and dysuria.  Musculoskeletal: Negative for joint swelling and myalgias.  Skin: Negative for color change and rash.  Neurological: Negative for dizziness, light-headedness and headaches.  Psychiatric/Behavioral: Negative for agitation and dysphoric mood.      Objective:     Blood pressure rechecked by me:  114/78  Physical Exam  Constitutional: She appears well-developed and well-nourished. No distress.  HENT:  Nose: Nose normal.  Mouth/Throat: Oropharynx is clear and moist.  Neck: Neck supple. No thyromegaly present.  Cardiovascular: Normal rate and regular rhythm.  Pulmonary/Chest: Breath sounds normal. No respiratory  distress. She has no wheezes.  Abdominal: Soft. Bowel sounds are normal. There is no tenderness.  Musculoskeletal: She exhibits no edema or tenderness.  Lymphadenopathy:    She has no cervical adenopathy.  Psychiatric: She has a normal mood and affect. Her behavior is normal.    BP 112/70 (BP Location: Left Arm, Patient Position: Sitting, Cuff Size: Normal)   Pulse (!) 53   Temp 98.2 F (36.8 C) (Oral)   Resp 18   Wt 176 lb 3.2 oz (79.9 kg)   SpO2 99%   BMI 33.29 kg/m  Wt  Readings from Last 3 Encounters:  05/11/17 176 lb 3.2 oz (79.9 kg)  03/18/17 174 lb (78.9 kg)  01/07/17 170 lb 12 oz (77.5 kg)     Lab Results  Component Value Date   WBC 6.3 12/29/2016   HGB 14.1 12/29/2016   HCT 39.3 12/29/2016   PLT 199 12/29/2016   GLUCOSE 93 12/29/2016   CHOL 190 12/29/2016   TRIG 174 (H) 12/29/2016   HDL 42 12/29/2016   LDLCALC 113 (H) 12/29/2016   ALT 70 (H) 12/29/2016   AST 55 (H) 12/29/2016   NA 144 12/29/2016   K 3.8 12/29/2016   CL 101 12/29/2016   CREATININE 1.14 (H) 12/29/2016   BUN 13 12/29/2016   CO2 28 12/29/2016   TSH 0.698 12/29/2016   HGBA1C 6.0 (H) 12/29/2016    Mm Screening Breast Tomo Bilateral  Result Date: 02/25/2017 CLINICAL DATA:  Screening. EXAM: 2D DIGITAL SCREENING BILATERAL MAMMOGRAM WITH 3D TOMO WITH CAD COMPARISON:  Previous exam(s). ACR Breast Density Category b: There are scattered areas of fibroglandular density. FINDINGS: There are no findings suspicious for malignancy. Images were processed with CAD. IMPRESSION: No mammographic evidence of malignancy. A result letter of this screening mammogram will be mailed directly to the patient. RECOMMENDATION: Screening mammogram in one year. (Code:SM-B-01Y) BI-RADS CATEGORY  1: Negative. Electronically Signed   By: Evangeline Dakin M.D.   On: 02/25/2017 10:31       Assessment & Plan:   Problem List Items Addressed This Visit    BMI 33.0-33.9,adult    Discussed diet and exercise.  Follow.        Family history of breast cancer    Mammogram 02/25/17 - Birads I.       Fatty liver    Followed by GI.  Diet and exercise.  Follow.        Relevant Orders   Hepatic function panel   Hypercholesteremia    On pravastatin.  Low cholesterol diet and exercise.  Follow lipid panel and liver function tests.        Relevant Orders   Lipid panel   Hypertension    Blood pressure under good control.  Continue same medication regimen.  Follow pressures.  Follow metabolic panel.        Relevant Orders   Basic metabolic panel   Hypothyroidism    On thyroid replacement.  Follow tsh.       Pituitary macroadenoma ([place])    Followed by Dr Gabriel Carina.  Just evaluated 03/2017.  Stable.  Recommended f/u in 6 months.        Renal insufficiency    Creatinine stable 1.14 last check.  Avoid antiinflammatories.  Follow.         Other Visit Diagnoses    Hyperglycemia    -  Primary   Low carb diet and exercise.  Follow met b and a1c.    Relevant Orders   Hemoglobin A1c       Einar Pheasant, MD

## 2017-05-14 ENCOUNTER — Encounter: Payer: Self-pay | Admitting: Internal Medicine

## 2017-05-14 NOTE — Assessment & Plan Note (Signed)
On thyroid replacement.  Follow tsh.  

## 2017-05-14 NOTE — Assessment & Plan Note (Signed)
Blood pressure under good control.  Continue same medication regimen.  Follow pressures.  Follow metabolic panel.   

## 2017-05-14 NOTE — Assessment & Plan Note (Signed)
Creatinine stable 1.14 last check.  Avoid antiinflammatories.  Follow.

## 2017-05-14 NOTE — Assessment & Plan Note (Signed)
Mammogram 02/25/17 - Birads I.  

## 2017-05-14 NOTE — Assessment & Plan Note (Signed)
Followed by Dr Gabriel Carina.  Just evaluated 03/2017.  Stable.  Recommended f/u in 6 months.

## 2017-05-14 NOTE — Assessment & Plan Note (Signed)
Discussed diet and exercise.  Follow.  

## 2017-05-14 NOTE — Assessment & Plan Note (Signed)
On pravastatin.  Low cholesterol diet and exercise.  Follow lipid panel and liver function tests.   

## 2017-05-14 NOTE — Assessment & Plan Note (Signed)
Followed by GI.  Diet and exercise.  Follow.

## 2017-05-25 DIAGNOSIS — I10 Essential (primary) hypertension: Secondary | ICD-10-CM | POA: Diagnosis not present

## 2017-05-25 DIAGNOSIS — K760 Fatty (change of) liver, not elsewhere classified: Secondary | ICD-10-CM | POA: Diagnosis not present

## 2017-05-25 DIAGNOSIS — R739 Hyperglycemia, unspecified: Secondary | ICD-10-CM | POA: Diagnosis not present

## 2017-05-25 DIAGNOSIS — E7800 Pure hypercholesterolemia, unspecified: Secondary | ICD-10-CM | POA: Diagnosis not present

## 2017-05-26 ENCOUNTER — Encounter: Payer: Self-pay | Admitting: Internal Medicine

## 2017-05-26 LAB — BASIC METABOLIC PANEL
BUN/Creatinine Ratio: 16 (ref 12–28)
BUN: 18 mg/dL (ref 8–27)
CALCIUM: 9.5 mg/dL (ref 8.7–10.3)
CO2: 27 mmol/L (ref 20–29)
CREATININE: 1.15 mg/dL — AB (ref 0.57–1.00)
Chloride: 100 mmol/L (ref 96–106)
GFR calc Af Amer: 59 mL/min/{1.73_m2} — ABNORMAL LOW (ref >59)
GFR calc non Af Amer: 51 mL/min/{1.73_m2} — ABNORMAL LOW (ref >59)
GLUCOSE: 93 mg/dL (ref 65–99)
Potassium: 3.9 mmol/L (ref 3.5–5.2)
Sodium: 144 mmol/L (ref 134–144)

## 2017-05-26 LAB — LIPID PANEL
CHOLESTEROL TOTAL: 183 mg/dL (ref 100–199)
Chol/HDL Ratio: 4.8 ratio — ABNORMAL HIGH (ref 0.0–4.4)
HDL: 38 mg/dL — ABNORMAL LOW (ref >39)
LDL Calculated: 102 mg/dL — ABNORMAL HIGH (ref 0–99)
Triglycerides: 216 mg/dL — ABNORMAL HIGH (ref 0–149)
VLDL Cholesterol Cal: 43 mg/dL — ABNORMAL HIGH (ref 5–40)

## 2017-05-26 LAB — HEPATIC FUNCTION PANEL
ALK PHOS: 85 IU/L (ref 39–117)
ALT: 54 IU/L — AB (ref 0–32)
AST: 37 IU/L (ref 0–40)
Albumin: 4.5 g/dL (ref 3.6–4.8)
BILIRUBIN TOTAL: 0.3 mg/dL (ref 0.0–1.2)
BILIRUBIN, DIRECT: 0.1 mg/dL (ref 0.00–0.40)
Total Protein: 6.4 g/dL (ref 6.0–8.5)

## 2017-05-26 LAB — HEMOGLOBIN A1C
Est. average glucose Bld gHb Est-mCnc: 126 mg/dL
Hgb A1c MFr Bld: 6 % — ABNORMAL HIGH (ref 4.8–5.6)

## 2017-07-05 ENCOUNTER — Other Ambulatory Visit: Payer: Self-pay | Admitting: Internal Medicine

## 2017-08-25 DIAGNOSIS — D1801 Hemangioma of skin and subcutaneous tissue: Secondary | ICD-10-CM | POA: Diagnosis not present

## 2017-08-25 DIAGNOSIS — D225 Melanocytic nevi of trunk: Secondary | ICD-10-CM | POA: Diagnosis not present

## 2017-08-25 DIAGNOSIS — L821 Other seborrheic keratosis: Secondary | ICD-10-CM | POA: Diagnosis not present

## 2017-08-25 DIAGNOSIS — D229 Melanocytic nevi, unspecified: Secondary | ICD-10-CM | POA: Diagnosis not present

## 2017-08-25 DIAGNOSIS — D485 Neoplasm of uncertain behavior of skin: Secondary | ICD-10-CM | POA: Diagnosis not present

## 2017-08-25 DIAGNOSIS — D2230 Melanocytic nevi of unspecified part of face: Secondary | ICD-10-CM | POA: Diagnosis not present

## 2017-08-25 DIAGNOSIS — L719 Rosacea, unspecified: Secondary | ICD-10-CM | POA: Diagnosis not present

## 2017-08-25 DIAGNOSIS — L578 Other skin changes due to chronic exposure to nonionizing radiation: Secondary | ICD-10-CM | POA: Diagnosis not present

## 2017-08-25 DIAGNOSIS — Z1283 Encounter for screening for malignant neoplasm of skin: Secondary | ICD-10-CM | POA: Diagnosis not present

## 2017-09-09 ENCOUNTER — Telehealth: Payer: Self-pay

## 2017-09-09 DIAGNOSIS — E7800 Pure hypercholesterolemia, unspecified: Secondary | ICD-10-CM

## 2017-09-09 DIAGNOSIS — I10 Essential (primary) hypertension: Secondary | ICD-10-CM

## 2017-09-09 DIAGNOSIS — K760 Fatty (change of) liver, not elsewhere classified: Secondary | ICD-10-CM

## 2017-09-09 DIAGNOSIS — R739 Hyperglycemia, unspecified: Secondary | ICD-10-CM

## 2017-09-09 NOTE — Telephone Encounter (Signed)
Pt had labs done on 4/30. She has an upcoming appt with you and is wondering if she needs labs prior to appt.

## 2017-09-10 NOTE — Telephone Encounter (Signed)
Orders have been placed for labs.  Please schedule her for a fasting lab appt

## 2017-09-10 NOTE — Telephone Encounter (Signed)
LMTCB

## 2017-09-13 ENCOUNTER — Telehealth: Payer: Self-pay | Admitting: Internal Medicine

## 2017-09-13 DIAGNOSIS — R739 Hyperglycemia, unspecified: Secondary | ICD-10-CM

## 2017-09-13 DIAGNOSIS — K760 Fatty (change of) liver, not elsewhere classified: Secondary | ICD-10-CM

## 2017-09-13 DIAGNOSIS — I10 Essential (primary) hypertension: Secondary | ICD-10-CM

## 2017-09-13 DIAGNOSIS — E7800 Pure hypercholesterolemia, unspecified: Secondary | ICD-10-CM

## 2017-09-13 NOTE — Telephone Encounter (Signed)
Lab orders

## 2017-09-13 NOTE — Telephone Encounter (Signed)
Copied from [place] [id number]. Topic: General - Other >> Sep 13, 2017  8:13 AM Cecelia Byars, NT wrote: Reason for CRM: Patient called and would like to know if her lab orders were sent  to [facility] and she says she can be contacted thru my chart or voicemail ok to leave a message  [phone]

## 2017-09-15 NOTE — Telephone Encounter (Signed)
I have reordered labs for [place] patient and left a message to make her aware

## 2017-09-16 ENCOUNTER — Other Ambulatory Visit: Payer: Self-pay | Admitting: Internal Medicine

## 2017-09-16 DIAGNOSIS — R739 Hyperglycemia, unspecified: Secondary | ICD-10-CM | POA: Diagnosis not present

## 2017-09-16 DIAGNOSIS — E220 Acromegaly and pituitary gigantism: Secondary | ICD-10-CM | POA: Diagnosis not present

## 2017-09-16 DIAGNOSIS — E7800 Pure hypercholesterolemia, unspecified: Secondary | ICD-10-CM | POA: Diagnosis not present

## 2017-09-16 DIAGNOSIS — K760 Fatty (change of) liver, not elsewhere classified: Secondary | ICD-10-CM | POA: Diagnosis not present

## 2017-09-16 DIAGNOSIS — I10 Essential (primary) hypertension: Secondary | ICD-10-CM | POA: Diagnosis not present

## 2017-09-16 NOTE — Telephone Encounter (Signed)
Noted.  Let me know if I need to do anything.  ?

## 2017-09-17 LAB — LIPID PANEL
CHOL/HDL RATIO: 4.6 ratio — AB (ref 0.0–4.4)
Cholesterol, Total: 194 mg/dL (ref 100–199)
HDL: 42 mg/dL (ref >39)
LDL Calculated: 122 mg/dL — ABNORMAL HIGH (ref 0–99)
Triglycerides: 150 mg/dL — ABNORMAL HIGH (ref 0–149)
VLDL Cholesterol Cal: 30 mg/dL (ref 5–40)

## 2017-09-17 LAB — BASIC METABOLIC PANEL
BUN / CREAT RATIO: 13 (ref 12–28)
BUN: 14 mg/dL (ref 8–27)
CHLORIDE: 99 mmol/L (ref 96–106)
CO2: 25 mmol/L (ref 20–29)
Calcium: 9.7 mg/dL (ref 8.7–10.3)
Creatinine, Ser: 1.09 mg/dL — ABNORMAL HIGH (ref 0.57–1.00)
GFR, EST AFRICAN AMERICAN: 63 mL/min/{1.73_m2} (ref >59)
GFR, EST NON AFRICAN AMERICAN: 55 mL/min/{1.73_m2} — AB (ref >59)
Glucose: 98 mg/dL (ref 65–99)
POTASSIUM: 3.7 mmol/L (ref 3.5–5.2)
Sodium: 143 mmol/L (ref 134–144)

## 2017-09-17 LAB — TSH: TSH: 0.671 u[IU]/mL (ref 0.450–4.500)

## 2017-09-17 LAB — HEPATIC FUNCTION PANEL
ALBUMIN: 4.5 g/dL (ref 3.6–4.8)
ALK PHOS: 88 IU/L (ref 39–117)
ALT: 66 IU/L — ABNORMAL HIGH (ref 0–32)
AST: 54 IU/L — ABNORMAL HIGH (ref 0–40)
BILIRUBIN, DIRECT: 0.1 mg/dL (ref 0.00–0.40)
Bilirubin Total: 0.3 mg/dL (ref 0.0–1.2)
TOTAL PROTEIN: 6.7 g/dL (ref 6.0–8.5)

## 2017-09-17 LAB — HEMOGLOBIN A1C
ESTIMATED AVERAGE GLUCOSE: 126 mg/dL
HEMOGLOBIN A1C: 6 % — AB (ref 4.8–5.6)

## 2017-09-20 ENCOUNTER — Encounter: Payer: Self-pay | Admitting: Internal Medicine

## 2017-09-24 ENCOUNTER — Ambulatory Visit: Payer: 59 | Admitting: Internal Medicine

## 2017-09-24 ENCOUNTER — Encounter: Payer: Self-pay | Admitting: Internal Medicine

## 2017-09-24 DIAGNOSIS — I10 Essential (primary) hypertension: Secondary | ICD-10-CM

## 2017-09-24 DIAGNOSIS — E7800 Pure hypercholesterolemia, unspecified: Secondary | ICD-10-CM | POA: Diagnosis not present

## 2017-09-24 DIAGNOSIS — E039 Hypothyroidism, unspecified: Secondary | ICD-10-CM

## 2017-09-24 DIAGNOSIS — K760 Fatty (change of) liver, not elsewhere classified: Secondary | ICD-10-CM

## 2017-09-24 DIAGNOSIS — Z803 Family history of malignant neoplasm of breast: Secondary | ICD-10-CM

## 2017-09-24 DIAGNOSIS — D352 Benign neoplasm of pituitary gland: Secondary | ICD-10-CM

## 2017-09-24 DIAGNOSIS — Z8371 Family history of colonic polyps: Secondary | ICD-10-CM

## 2017-09-24 MED ORDER — PRAVASTATIN SODIUM 40 MG PO TABS: 40.0000 mg | ORAL_TABLET | Freq: Every day | ORAL | 1 refills | Status: DC

## 2017-09-24 MED ORDER — LEVOTHYROXINE SODIUM 112 MCG PO TABS: 112.0000 ug | ORAL_TABLET | Freq: Every day | ORAL | 3 refills | Status: DC

## 2017-09-24 MED ORDER — ALLOPURINOL 100 MG PO TABS: 100.0000 mg | ORAL_TABLET | Freq: Every day | ORAL | 3 refills | Status: DC

## 2017-09-24 NOTE — Progress Notes (Signed)
Patient ID: Marie Colon, female   DOB: Jan 28, 1955, 62 y.o.   MRN: [id number]   Subjective:    Patient ID: Marie Colon, female    DOB: 1955/06/03, 62 y.o.   MRN: [id number]  HPI  Patient here for a scheduled follow up.  She reports she is doing well.  Has started back walking again.  Was not exercising previously.  No chest pain.  No sob.  No acid reflux.  No abdominal pain.  Bowels moving.  No urine change.  Due f/u with endocrinology next month.  Discussed labs.  She has been eating more ice cream sandwiches lately.  Recently decreased.  Discussed low carb diet and exercise.     Past Medical History:  Diagnosis Date  . Acromegaly ([place])   . Angiomyolipoma of kidney    evaluated by Dr Bernardo Heater  . Fatty liver   . GERD (gastroesophageal reflux disease)   . Heart murmur    per Dr. Einar Pheasant  . History of pyelonephritis   . History of ulcerative colitis   . Hypercholesterolemia   . Hypertension   . Hypothyroidism   . Pituitary macroadenoma ([facility])    s/p removal (elevated growth hormone)  . Tubal pregnancy    s/p rupture  . Wears glasses    Past Surgical History:  Procedure Laterality Date  . ABDOMINAL HYSTERECTOMY  2003   abnormal uterine bleeding  . APPENDECTOMY    . CHOLECYSTECTOMY  2005  . COLONOSCOPY WITH PROPOFOL N/A 03/18/2017   Procedure: COLONOSCOPY WITH PROPOFOL;  Surgeon: Lollie Sails, MD;  Location: [facility] ENDOSCOPY;  Service: Endoscopy;  Laterality: N/A;  . DILATION AND CURETTAGE OF UTERUS    . MASS EXCISION Right 08/29/2015   Procedure: EXCISION OF RIGHT THIGH MASS SUBFASICAL 15 CM;  Surgeon: Stark Klein, MD;  Location: [facility];  Service: General;  Laterality: Right;  . pituitary tumor removal  2006   Family History  Problem Relation Age of Onset  . Brain cancer Father        died - 26  . Diabetes Mother   . Hypothyroidism Sister   . Breast cancer Sister 66  . Sjogren's syndrome Sister   . Breast cancer Sister 12  . Diabetes Unknown    grandmother   Social History   Socioeconomic History  . Marital status: Married    Spouse name: Not on file  . Number of children: 2  . Years of education: Not on file  . 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 Financial resource strain: Not on file  . Food insecurity:    Worry: Not on file    Inability: Not on file  . Transportation needs:    Medical: Not on file    Non-medical: Not on file  Tobacco Use  . Smoking status: Never Smoker  . Smokeless tobacco: Never Used  Substance and Sexual Activity  . Alcohol use: No    Alcohol/week: 0.0 standard drinks  . Drug use: No  . Sexual activity: Not on file  Lifestyle  . Physical activity:    Days per week: Not on file    Minutes per session: Not on file  . Stress: Not on file  Relationships  . Social connections:    Talks on phone: Not on file    Gets together: Not on file    Attends religious service: Not on file  Active member of club or organization: Not on file    Attends meetings of clubs or organizations: Not on file    Relationship status: Not on file  Other Topics Concern  . Not on file  Social History Narrative  . Not on file    Outpatient Encounter Medications as of 09/24/2017  Medication Sig  . allopurinol (ZYLOPRIM) 100 MG tablet Take 1 tablet (100 mg total) by mouth daily.  Marland Kitchen ALPRAZolam (XANAX) 0.25 MG tablet Take 1 tablet (0.25 mg total) by mouth 2 (two) times daily as needed for anxiety. (Patient taking differently: Take 0.25 mg by mouth as needed for anxiety. )  . aspirin EC 81 MG tablet Take 81 mg by mouth daily.  . cabergoline (DOSTINEX) 0.5 MG tablet Take 1 tablet by mouth at bedtime.  . colchicine 0.6 MG tablet Take 1 tablet (0.6 mg total) by mouth 2 (two) times daily. (Patient taking differently: Take 0.6 mg by mouth as needed. )  . lansoprazole (PREVACID) 30 MG capsule TAKE 1 CAPSULE BY MOUTH DAILY AT 12 NOON.  Marland Kitchen  levothyroxine (SYNTHROID, LEVOTHROID) 112 MCG tablet Take 1 tablet (112 mcg total) by mouth daily before breakfast.  . metoprolol tartrate (LOPRESSOR) 50 MG tablet TAKE ONE TABLET BY MOUTH 2 TIMES A DAY  . Multiple Vitamin (MULTIVITAMIN) tablet Take 1 tablet by mouth daily.  . polyethylene glycol powder (GLYCOLAX/MIRALAX) powder Take with your choice of fluid. This is safe to use on a daily basis if needed.  . pravastatin (PRAVACHOL) 40 MG tablet Take 1 tablet (40 mg total) by mouth daily.  Marland Kitchen triamterene-hydrochlorothiazide (DYAZIDE) 37.5-25 MG capsule TAKE 1 CAPSULE BY MOUTH DAILY.  . [DISCONTINUED] allopurinol (ZYLOPRIM) 100 MG tablet TAKE 1 TABLET BY MOUTH DAILY  . [DISCONTINUED] levothyroxine (SYNTHROID, LEVOTHROID) 112 MCG tablet Take 1 tablet (112 mcg total) by mouth daily before breakfast.  . [DISCONTINUED] pravastatin (PRAVACHOL) 40 MG tablet TAKE 1 TABLET BY MOUTH DAILY  . [DISCONTINUED] doxycycline (VIBRA-TABS) 100 MG tablet Take 1 tablet (100 mg total) by mouth 2 (two) times daily. (Patient not taking: Reported on 09/24/2017)   No facility-administered encounter medications on file as of 09/24/2017.     Review of Systems  Constitutional: Negative for appetite change and unexpected weight change.  HENT: Negative for congestion and sinus pressure.   Respiratory: Negative for cough, chest tightness and shortness of breath.   Cardiovascular: Negative for chest pain, palpitations and leg swelling.  Gastrointestinal: Negative for abdominal pain, diarrhea, nausea and vomiting.  Genitourinary: Negative for difficulty urinating and dysuria.  Musculoskeletal: Negative for back pain, joint swelling and myalgias.  Skin: Negative for color change and rash.  Neurological: Negative for dizziness, light-headedness and headaches.  Psychiatric/Behavioral: Negative for agitation and dysphoric mood.       Objective:     Blood pressure rechecked by me:  114/70  Physical Exam  Constitutional: She  appears well-developed and well-nourished. No distress.  HENT:  Nose: Nose normal.  Mouth/Throat: Oropharynx is clear and moist.  Neck: Neck supple. No thyromegaly present.  Cardiovascular: Normal rate and regular rhythm.  Pulmonary/Chest: Breath sounds normal. No respiratory distress. She has no wheezes.  Abdominal: Soft. Bowel sounds are normal. There is no tenderness.  Musculoskeletal: She exhibits no edema or tenderness.  Lymphadenopathy:    She has no cervical adenopathy.  Skin: No rash noted. No erythema.  Psychiatric: She has a normal mood and affect. Her behavior is normal.    BP 116/72 (BP Location: Left Arm, Patient Position:  Sitting, Cuff Size: Normal)   Pulse 82   Temp 98.5 F (36.9 C) (Oral)   Resp 14   Ht 5\' 1"  (1.549 m)   Wt 173 lb 9.6 oz (78.7 kg)   SpO2 99%   BMI 32.80 kg/m  Wt Readings from Last 3 Encounters:  09/24/17 173 lb 9.6 oz (78.7 kg)  05/11/17 176 lb 3.2 oz (79.9 kg)  03/18/17 174 lb (78.9 kg)     Lab Results  Component Value Date   WBC 6.3 12/29/2016   HGB 14.1 12/29/2016   HCT 39.3 12/29/2016   PLT 199 12/29/2016   GLUCOSE 98 09/16/2017   CHOL 194 09/16/2017   TRIG 150 (H) 09/16/2017   HDL 42 09/16/2017   LDLCALC 122 (H) 09/16/2017   ALT 66 (H) 09/16/2017   AST 54 (H) 09/16/2017   NA 143 09/16/2017   K 3.7 09/16/2017   CL 99 09/16/2017   CREATININE 1.09 (H) 09/16/2017   BUN 14 09/16/2017   CO2 25 09/16/2017   TSH 0.671 09/16/2017   HGBA1C 6.0 (H) 09/16/2017    Mm Screening Breast Tomo Bilateral  Result Date: 02/25/2017 CLINICAL DATA:  Screening. EXAM: 2D DIGITAL SCREENING BILATERAL MAMMOGRAM WITH 3D TOMO WITH CAD COMPARISON:  Previous exam(s). ACR Breast Density Category b: There are scattered areas of fibroglandular density. FINDINGS: There are no findings suspicious for malignancy. Images were processed with CAD. IMPRESSION: No mammographic evidence of malignancy. A result letter of this screening mammogram will be mailed  directly to the patient. RECOMMENDATION: Screening mammogram in one year. (Code:SM-B-01Y) BI-RADS CATEGORY  1: Negative. Electronically Signed   By: Evangeline Dakin M.D.   On: 02/25/2017 10:31       Assessment & Plan:   Problem List Items Addressed This Visit    Family history of breast cancer    Mammogram 02/25/17 - Birads I.       Family history of colonic polyps    Colonoscopy 08/2012 - normal.  Recommended f/u colonoscopy in 5 years.        Fatty liver    Recent liver function tests slightly increased.  Diet/exercise/weight loss.  Has seen GI.        Hypercholesteremia    On pravastatin.  Low cholesterol diet and exercise.  Follow lipid panel and liver function tests.        Relevant Medications   pravastatin (PRAVACHOL) 40 MG tablet   Hypertension    Blood pressure under good control.  Continue same medication regimen.  Follow pressures.  Follow metabolic panel.        Relevant Medications   pravastatin (PRAVACHOL) 40 MG tablet   Hypothyroidism    On thyroid replacement.  Follow tsh.       Relevant Medications   levothyroxine (SYNTHROID, LEVOTHROID) 112 MCG tablet   Pituitary macroadenoma ([facility])    Followed by Dr Gabriel Carina.  Last evaluated 03/2017.  Stable.  Recommended f/u in 6 months.  Has f/u scheduled for next month.            Einar Pheasant, MD

## 2017-09-24 NOTE — Assessment & Plan Note (Signed)
Recent liver function tests slightly increased.  Diet/exercise/weight loss.  Has seen GI.

## 2017-09-27 ENCOUNTER — Encounter: Payer: Self-pay | Admitting: Internal Medicine

## 2017-09-27 NOTE — Assessment & Plan Note (Signed)
On pravastatin.  Low cholesterol diet and exercise.  Follow lipid panel and liver function tests.   

## 2017-09-27 NOTE — Assessment & Plan Note (Signed)
Colonoscopy 08/2012 - normal.  Recommended f/u colonoscopy in 5 years.

## 2017-09-27 NOTE — Assessment & Plan Note (Signed)
Mammogram 02/25/17 - Birads I.

## 2017-09-27 NOTE — Assessment & Plan Note (Signed)
Blood pressure under good control.  Continue same medication regimen.  Follow pressures.  Follow metabolic panel.   

## 2017-09-27 NOTE — Assessment & Plan Note (Signed)
Followed by Dr Gabriel Carina.  Last evaluated 03/2017.  Stable.  Recommended f/u in 6 months.  Has f/u scheduled for next month.

## 2017-09-27 NOTE — Assessment & Plan Note (Signed)
On thyroid replacement.  Follow tsh.  

## 2017-10-20 DIAGNOSIS — E039 Hypothyroidism, unspecified: Secondary | ICD-10-CM | POA: Diagnosis not present

## 2017-10-20 DIAGNOSIS — Z87898 Personal history of other specified conditions: Secondary | ICD-10-CM | POA: Diagnosis not present

## 2017-10-20 DIAGNOSIS — E220 Acromegaly and pituitary gigantism: Secondary | ICD-10-CM | POA: Diagnosis not present

## 2017-10-22 ENCOUNTER — Other Ambulatory Visit: Payer: Self-pay | Admitting: Internal Medicine

## 2017-10-22 DIAGNOSIS — E220 Acromegaly and pituitary gigantism: Secondary | ICD-10-CM

## 2017-11-05 DIAGNOSIS — H5213 Myopia, bilateral: Secondary | ICD-10-CM | POA: Diagnosis not present

## 2017-11-09 ENCOUNTER — Ambulatory Visit
Admission: RE | Admit: 2017-11-09 | Discharge: 2017-11-09 | Disposition: A | Discharge status: Home / Self Care | Payer: 59 | Source: Ambulatory Visit | Attending: Internal Medicine | Admitting: Internal Medicine

## 2017-11-09 DIAGNOSIS — E220 Acromegaly and pituitary gigantism: Secondary | ICD-10-CM | POA: Insufficient documentation

## 2017-11-09 DIAGNOSIS — I10 Essential (primary) hypertension: Secondary | ICD-10-CM | POA: Insufficient documentation

## 2017-11-09 NOTE — Progress Notes (Signed)
*  PRELIMINARY RESULTS* Echocardiogram 2D Echocardiogram has been performed.  Wallie Char Loral Campi 11/09/2017, 10:24 AM

## 2018-01-04 ENCOUNTER — Encounter: Payer: Self-pay | Admitting: Internal Medicine

## 2018-01-04 DIAGNOSIS — R739 Hyperglycemia, unspecified: Secondary | ICD-10-CM

## 2018-01-04 DIAGNOSIS — E7800 Pure hypercholesterolemia, unspecified: Secondary | ICD-10-CM

## 2018-01-04 DIAGNOSIS — I10 Essential (primary) hypertension: Secondary | ICD-10-CM

## 2018-01-04 NOTE — Telephone Encounter (Signed)
Orders placed for [facility] labs.

## 2018-01-05 DIAGNOSIS — I10 Essential (primary) hypertension: Secondary | ICD-10-CM | POA: Diagnosis not present

## 2018-01-05 DIAGNOSIS — E7800 Pure hypercholesterolemia, unspecified: Secondary | ICD-10-CM | POA: Diagnosis not present

## 2018-01-05 DIAGNOSIS — R739 Hyperglycemia, unspecified: Secondary | ICD-10-CM | POA: Diagnosis not present

## 2018-01-06 ENCOUNTER — Encounter: Payer: Self-pay | Admitting: Internal Medicine

## 2018-01-06 LAB — BASIC METABOLIC PANEL
BUN/Creatinine Ratio: 11 — ABNORMAL LOW (ref 12–28)
BUN: 12 mg/dL (ref 8–27)
CHLORIDE: 102 mmol/L (ref 96–106)
CO2: 27 mmol/L (ref 20–29)
Calcium: 9.9 mg/dL (ref 8.7–10.3)
Creatinine, Ser: 1.11 mg/dL — ABNORMAL HIGH (ref 0.57–1.00)
GFR calc Af Amer: 62 mL/min/{1.73_m2} (ref >59)
GFR calc non Af Amer: 53 mL/min/{1.73_m2} — ABNORMAL LOW (ref >59)
Glucose: 95 mg/dL (ref 65–99)
Potassium: 4.1 mmol/L (ref 3.5–5.2)
Sodium: 144 mmol/L (ref 134–144)

## 2018-01-06 LAB — HEPATIC FUNCTION PANEL
ALBUMIN: 4.1 g/dL (ref 3.6–4.8)
ALT: 60 IU/L — AB (ref 0–32)
AST: 40 IU/L (ref 0–40)
Alkaline Phosphatase: 86 IU/L (ref 39–117)
Bilirubin Total: 0.4 mg/dL (ref 0.0–1.2)
Bilirubin, Direct: 0.14 mg/dL (ref 0.00–0.40)
Total Protein: 6.3 g/dL (ref 6.0–8.5)

## 2018-01-06 LAB — HEMOGLOBIN A1C
ESTIMATED AVERAGE GLUCOSE: 123 mg/dL
Hgb A1c MFr Bld: 5.9 % — ABNORMAL HIGH (ref 4.8–5.6)

## 2018-01-06 LAB — LIPID PANEL
CHOL/HDL RATIO: 4.7 ratio — AB (ref 0.0–4.4)
Cholesterol, Total: 200 mg/dL — ABNORMAL HIGH (ref 100–199)
HDL: 43 mg/dL (ref >39)
LDL CALC: 126 mg/dL — AB (ref 0–99)
TRIGLYCERIDES: 154 mg/dL — AB (ref 0–149)
VLDL Cholesterol Cal: 31 mg/dL (ref 5–40)

## 2018-01-06 LAB — CBC WITH DIFFERENTIAL/PLATELET
BASOS: 1 %
Basophils Absolute: 0 10*3/uL (ref 0.0–0.2)
EOS (ABSOLUTE): 0.1 10*3/uL (ref 0.0–0.4)
Eos: 2 %
HEMATOCRIT: 40.3 % (ref 34.0–46.6)
Hemoglobin: 13.8 g/dL (ref 11.1–15.9)
Immature Grans (Abs): 0 10*3/uL (ref 0.0–0.1)
Immature Granulocytes: 0 %
LYMPHS ABS: 1.7 10*3/uL (ref 0.7–3.1)
Lymphs: 25 %
MCH: 29.9 pg (ref 26.6–33.0)
MCHC: 34.2 g/dL (ref 31.5–35.7)
MCV: 87 fL (ref 79–97)
MONOS ABS: 0.5 10*3/uL (ref 0.1–0.9)
Monocytes: 8 %
NEUTROS PCT: 64 %
Neutrophils Absolute: 4.4 10*3/uL (ref 1.4–7.0)
Platelets: 206 10*3/uL (ref 150–450)
RBC: 4.61 x10E6/uL (ref 3.77–5.28)
RDW: 13.2 % (ref 12.3–15.4)
WBC: 6.8 10*3/uL (ref 3.4–10.8)

## 2018-01-07 ENCOUNTER — Ambulatory Visit: Payer: 59 | Admitting: Internal Medicine

## 2018-01-12 ENCOUNTER — Other Ambulatory Visit: Payer: Self-pay | Admitting: Internal Medicine

## 2018-01-13 ENCOUNTER — Other Ambulatory Visit: Payer: Self-pay | Admitting: Internal Medicine

## 2018-01-24 ENCOUNTER — Other Ambulatory Visit (HOSPITAL_COMMUNITY)
Admission: RE | Admit: 2018-01-24 | Discharge: 2018-01-24 | Disposition: A | Discharge status: Home / Self Care | Payer: 59 | Source: Ambulatory Visit | Attending: Internal Medicine | Admitting: Internal Medicine

## 2018-01-24 ENCOUNTER — Encounter: Payer: Self-pay | Admitting: Internal Medicine

## 2018-01-24 ENCOUNTER — Ambulatory Visit (INDEPENDENT_AMBULATORY_CARE_PROVIDER_SITE_OTHER): Payer: 59 | Admitting: Internal Medicine

## 2018-01-24 VITALS — BP 116/62 | HR 60 | Temp 99.0°F | Ht 61.0 in | Wt 174.0 lb

## 2018-01-24 DIAGNOSIS — K760 Fatty (change of) liver, not elsewhere classified: Secondary | ICD-10-CM

## 2018-01-24 DIAGNOSIS — Z1239 Encounter for other screening for malignant neoplasm of breast: Secondary | ICD-10-CM

## 2018-01-24 DIAGNOSIS — Z0000 Encounter for general adult medical examination without abnormal findings: Secondary | ICD-10-CM

## 2018-01-24 DIAGNOSIS — E039 Hypothyroidism, unspecified: Secondary | ICD-10-CM

## 2018-01-24 DIAGNOSIS — Z124 Encounter for screening for malignant neoplasm of cervix: Secondary | ICD-10-CM | POA: Insufficient documentation

## 2018-01-24 DIAGNOSIS — D352 Benign neoplasm of pituitary gland: Secondary | ICD-10-CM | POA: Diagnosis not present

## 2018-01-24 DIAGNOSIS — Z803 Family history of malignant neoplasm of breast: Secondary | ICD-10-CM | POA: Diagnosis not present

## 2018-01-24 DIAGNOSIS — R739 Hyperglycemia, unspecified: Secondary | ICD-10-CM

## 2018-01-24 DIAGNOSIS — I10 Essential (primary) hypertension: Secondary | ICD-10-CM

## 2018-01-24 DIAGNOSIS — E7800 Pure hypercholesterolemia, unspecified: Secondary | ICD-10-CM

## 2018-01-24 NOTE — Progress Notes (Signed)
Pre visit review using our clinic review tool, if applicable. No additional management support is needed unless otherwise documented below in the visit note. 

## 2018-01-24 NOTE — Progress Notes (Signed)
Patient ID: Anastasio Champion, female   DOB: [birth year], 62 y.o.   MRN: [id number]   Subjective:    Patient ID: Anastasio Champion, female    DOB: Nov 20, 1955, 62 y.o.   MRN: [id number]  HPI  Patient here for her physical exam.  Saw Dr Gabriel Carina 09/2017.  Stable.  Recommended f/u in 6 months.  MRI deferred.  Had normal echo per report.  Overall she feels she is doing well.  Feels good.  Tries to stay active.  No chest pain.  No sob.  No acid reflux.  No abdominal pain.  Bowels moving.  No urine change.      Past Medical History:  Diagnosis Date  . Acromegaly ([place])   . Angiomyolipoma of kidney    evaluated by Dr Bernardo Heater  . Fatty liver   . GERD (gastroesophageal reflux disease)   . Heart murmur    per Dr. Einar Pheasant  . History of pyelonephritis   . History of ulcerative colitis   . Hypercholesterolemia   . Hypertension   . Hypothyroidism   . Pituitary macroadenoma ([facility])    s/p removal (elevated growth hormone)  . Tubal pregnancy    s/p rupture  . Wears glasses    Past Surgical History:  Procedure Laterality Date  . ABDOMINAL HYSTERECTOMY  2003   abnormal uterine bleeding  . APPENDECTOMY    . CHOLECYSTECTOMY  2005  . COLONOSCOPY WITH PROPOFOL N/A 03/18/2017   Procedure: COLONOSCOPY WITH PROPOFOL;  Surgeon: Lollie Sails, MD;  Location: [facility] ENDOSCOPY;  Service: Endoscopy;  Laterality: N/A;  . DILATION AND CURETTAGE OF UTERUS    . MASS EXCISION Right 08/29/2015   Procedure: EXCISION OF RIGHT THIGH MASS SUBFASICAL 15 CM;  Surgeon: Stark Klein, MD;  Location: [facility];  Service: General;  Laterality: Right;  . pituitary tumor removal  2006   Family History  Problem Relation Age of Onset  . Brain cancer Father        died - 82  . Diabetes Mother   . Hypothyroidism Sister   . Breast cancer Sister 89  . Sjogren's syndrome Sister   . Breast cancer Sister 80  . Diabetes Other        grandmother   Social History   Socioeconomic History  . Marital status: Married    Spouse  name: Not on file  . Number of children: 2  . Years of education: Not on file  . Highest education level: Not on file  Occupational History    Employer: [employer]    [employer] - [place]  Social Needs  . Financial resource strain: Not on file  . Food insecurity:    Worry: Not on file    Inability: Not on file  . Transportation needs:    Medical: Not on file    Non-medical: Not on file  Tobacco Use  . Smoking status: Never Smoker  . Smokeless tobacco: Never Used  Substance and Sexual Activity  . Alcohol use: No    Alcohol/week: 0.0 standard drinks  . Drug use: No  . Sexual activity: Not on file  Lifestyle  . Physical activity:    Days per week: Not on file    Minutes per session: Not on file  . Stress: Not on file  Relationships  . Social connections:    Talks on phone: Not on file    Gets together: Not on file    Attends religious service: Not on file    Active member  of club or organization: Not on file    Attends meetings of clubs or organizations: Not on file    Relationship status: Not on file  Other Topics Concern  . Not on file  Social History Narrative  . Not on file    Outpatient Encounter Medications as of 01/24/2018  Medication Sig  . allopurinol (ZYLOPRIM) 100 MG tablet Take 1 tablet (100 mg total) by mouth daily.  [place] Kitchen ALPRAZolam (XANAX) 0.25 MG tablet Take 1 tablet (0.25 mg total) by mouth 2 (two) times daily as needed for anxiety. (Patient taking differently: Take 0.25 mg by mouth as needed for anxiety. )  . aspirin EC 81 MG tablet Take 81 mg by mouth daily.  . cabergoline (DOSTINEX) 0.5 MG tablet Take 1 tablet by mouth at bedtime.  . colchicine 0.6 MG tablet Take 1 tablet (0.6 mg total) by mouth 2 (two) times daily. (Patient taking differently: Take 0.6 mg by mouth as needed. )  . lansoprazole (PREVACID) 30 MG capsule TAKE 1 CAPSULE BY MOUTH DAILY AT 12 NOON.  [place] Kitchen levothyroxine (SYNTHROID, LEVOTHROID) 112 MCG tablet Take 1 tablet (112 mcg total) by mouth daily  before breakfast.  . metoprolol tartrate (LOPRESSOR) 50 MG tablet TAKE ONE TABLET BY MOUTH 2 TIMES A DAY  . Multiple Vitamin (MULTIVITAMIN) tablet Take 1 tablet by mouth daily.  . polyethylene glycol powder (GLYCOLAX/MIRALAX) powder Take with your choice of fluid. This is safe to use on a daily basis if needed.  . pravastatin (PRAVACHOL) 40 MG tablet Take 1 tablet (40 mg total) by mouth daily.  [place] Kitchen triamterene-hydrochlorothiazide (DYAZIDE) 37.5-25 MG capsule TAKE 1 CAPSULE BY MOUTH DAILY.   No facility-administered encounter medications on file as of 01/24/2018.     Review of Systems  Constitutional: Negative for appetite change and unexpected weight change.  HENT: Negative for congestion and sinus pressure.   Eyes: Negative for pain and visual disturbance.  Respiratory: Negative for cough, chest tightness and shortness of breath.   Cardiovascular: Negative for chest pain, palpitations and leg swelling.  Gastrointestinal: Negative for abdominal pain, diarrhea, nausea and vomiting.  Genitourinary: Negative for difficulty urinating and dysuria.  Musculoskeletal: Negative for joint swelling and myalgias.  Skin: Negative for color change and rash.  Neurological: Negative for dizziness, light-headedness and headaches.  Hematological: Negative for adenopathy. Does not bruise/bleed easily.  Psychiatric/Behavioral: Negative for agitation and dysphoric mood.       Objective:    Physical Exam Constitutional:      General: She is not in acute distress.    Appearance: Normal appearance. She is well-developed.  HENT:     Nose: Nose normal. No congestion.     Mouth/Throat:     Pharynx: No oropharyngeal exudate or posterior oropharyngeal erythema.  Eyes:     General: No scleral icterus.       Right eye: No discharge.        Left eye: No discharge.  Neck:     Musculoskeletal: Neck supple. No muscular tenderness.     Thyroid: No thyromegaly.  Cardiovascular:     Rate and Rhythm: Normal rate  and regular rhythm.  Pulmonary:     Effort: No tachypnea, accessory muscle usage or respiratory distress.     Breath sounds: Normal breath sounds. No decreased breath sounds, wheezing or rhonchi.  Chest:     Breasts:        Right: No inverted nipple, mass, nipple discharge or tenderness (no axillary adenopathy).  Left: No inverted nipple, mass, nipple discharge or tenderness (no axilarry adenopathy).  Abdominal:     General: Bowel sounds are normal.     Palpations: Abdomen is soft.     Tenderness: There is no abdominal tenderness.  Genitourinary:    Comments: Normal external genitalia.  Vaginal vault without lesions.  S/p hysterectomy.  Pap smear performed of the vaginal cuff.   Could not appreciate any adnexal masses or tenderness.   Musculoskeletal:        General: No swelling or tenderness.  Lymphadenopathy:     Cervical: No cervical adenopathy.  Skin:    Findings: No erythema or rash.  Neurological:     Mental Status: She is alert and oriented to person, place, and time.  Psychiatric:        Mood and Affect: Mood normal.        Behavior: Behavior normal.     BP 116/62   Pulse 60   Temp 99 F (37.2 C) (Oral)   Ht _0  (1.549 m)   Wt 174 lb (78.9 kg)   SpO2 98%   BMI 32.88 kg/m  Wt Readings from Last 3 Encounters:  01/24/18 174 lb (78.9 kg)  09/24/17 173 lb 9.6 oz (78.7 kg)  05/11/17 176 lb 3.2 oz (79.9 kg)     Lab Results  Component Value Date   WBC 6.8 01/05/2018   HGB 13.8 01/05/2018   HCT 40.3 01/05/2018   PLT 206 01/05/2018   GLUCOSE 95 01/05/2018   CHOL 200 (H) 01/05/2018   TRIG 154 (H) 01/05/2018   HDL 43 01/05/2018   LDLCALC 126 (H) 01/05/2018   ALT 60 (H) 01/05/2018   AST 40 01/05/2018   NA 144 01/05/2018   K 4.1 01/05/2018   CL 102 01/05/2018   CREATININE 1.11 (H) 01/05/2018   BUN 12 01/05/2018   CO2 27 01/05/2018   TSH 0.671 09/16/2017   HGBA1C 5.9 (H) 01/05/2018       Assessment & Plan:   Problem List Items Addressed This  Visit    Family history of breast cancer    Mammogram 02/25/17 - birads I.        Fatty liver    Discussed recent liver function tests.  Stable.  Discussed diet, exercise and weight loss.  Has seen GI.  Discussed f/u.        Health care maintenance    Physical today 01/24/18.  PAP 01/24/18.  Mammogram 02/25/17 - Birads I.  Schedule f/u mammogram.  Colonoscopy 03/18/17 - tubular adenoma.       Hypercholesteremia    On pravastatin.  Low cholesterol diet and exercise.  Follow lipid panel and liver function tests.        Relevant Orders   Hepatic function panel   Lipid panel   Hyperglycemia    Low carb diet and exercise.  Follow met b and a1c.        Relevant Orders   Hemoglobin A1c   Hypertension    Blood pressure under good control.  Continue same medication regimen.  Follow pressures.  Follow metabolic panel.        Relevant Orders   Basic metabolic panel   Hypothyroidism    On thyroid replacement.  Follow tsh.        Pituitary macroadenoma ([place])    Followed by Dr Gabriel Carina.  Last evaluated 09/2017.  Stable.  Recommended f/u in 6 months.  On cabergoline.  ECHO normal.  Other Visit Diagnoses    Routine general medical examination at a health care facility    -  Primary   Breast cancer screening       Relevant Orders   MM 3D SCREEN BREAST BILATERAL   Pap smear for cervical cancer screening       Relevant Orders   Cytology - PAP( [place]) (Completed)       Einar Pheasant, MD

## 2018-01-25 ENCOUNTER — Encounter: Payer: Self-pay | Admitting: Internal Medicine

## 2018-01-25 DIAGNOSIS — R739 Hyperglycemia, unspecified: Secondary | ICD-10-CM | POA: Insufficient documentation

## 2018-01-25 LAB — CYTOLOGY - PAP
Diagnosis: NEGATIVE
HPV: NOT DETECTED

## 2018-01-25 NOTE — Assessment & Plan Note (Signed)
Discussed recent liver function tests.  Stable.  Discussed diet, exercise and weight loss.  Has seen GI.  Discussed f/u.

## 2018-01-25 NOTE — Assessment & Plan Note (Signed)
Low carb diet and exercise.  Follow met b and a1c.   

## 2018-01-25 NOTE — Assessment & Plan Note (Signed)
Followed by Dr Gabriel Carina.  Last evaluated 09/2017.  Stable.  Recommended f/u in 6 months.  On cabergoline.  ECHO normal.

## 2018-01-25 NOTE — Assessment & Plan Note (Signed)
On thyroid replacement.  Follow tsh.  

## 2018-01-25 NOTE — Assessment & Plan Note (Signed)
Physical today 01/24/18.  PAP 01/24/18.  Mammogram 02/25/17 - Birads I.  Schedule f/u mammogram.  Colonoscopy 03/18/17 - tubular adenoma.

## 2018-01-25 NOTE — Assessment & Plan Note (Signed)
Blood pressure under good control.  Continue same medication regimen.  Follow pressures.  Follow metabolic panel.   

## 2018-01-25 NOTE — Assessment & Plan Note (Signed)
Mammogram 02/25/17 - birads I.

## 2018-01-25 NOTE — Assessment & Plan Note (Signed)
On pravastatin.  Low cholesterol diet and exercise.  Follow lipid panel and liver function tests.   

## 2018-02-28 ENCOUNTER — Other Ambulatory Visit: Payer: Self-pay | Admitting: Internal Medicine

## 2018-02-28 ENCOUNTER — Other Ambulatory Visit: Payer: Self-pay

## 2018-02-28 ENCOUNTER — Encounter: Payer: Self-pay | Admitting: Emergency Medicine

## 2018-02-28 ENCOUNTER — Ambulatory Visit
Admission: EM | Admit: 2018-02-28 | Discharge: 2018-02-28 | Disposition: A | Discharge status: Home / Self Care | Payer: 59 | Attending: Family Medicine | Admitting: Family Medicine

## 2018-02-28 DIAGNOSIS — R509 Fever, unspecified: Secondary | ICD-10-CM

## 2018-02-28 DIAGNOSIS — R05 Cough: Secondary | ICD-10-CM

## 2018-02-28 DIAGNOSIS — R6883 Chills (without fever): Secondary | ICD-10-CM | POA: Diagnosis not present

## 2018-02-28 DIAGNOSIS — R112 Nausea with vomiting, unspecified: Secondary | ICD-10-CM

## 2018-02-28 DIAGNOSIS — R69 Illness, unspecified: Principal | ICD-10-CM

## 2018-02-28 DIAGNOSIS — J111 Influenza due to unidentified influenza virus with other respiratory manifestations: Secondary | ICD-10-CM

## 2018-02-28 MED ORDER — ONDANSETRON 8 MG PO TBDP: 8.0000 mg | ORAL_TABLET | Freq: Three times a day (TID) | ORAL | 0 refills | Status: DC | PRN

## 2018-02-28 MED ORDER — BALOXAVIR MARBOXIL(80 MG DOSE) 2 X 40 MG PO TBPK: 80.0000 mg | ORAL_TABLET | Freq: Once | ORAL | 0 refills | Status: AC

## 2018-02-28 MED ORDER — BALOXAVIR MARBOXIL(80 MG DOSE) 2 X 40 MG PO TBPK: 80.0000 mg | ORAL_TABLET | Freq: Once | ORAL | 0 refills | Status: DC

## 2018-02-28 NOTE — Discharge Instructions (Signed)
-  Tamiflu: 1 tablet twice a day for 5 days -Zofran: 1 tab under the tongue every 8 hours as needed for nausea -Recommend over-the-counter Flonase or Zyrtec to help with nasal/sinus congestion and drainage -Can also use nasal saline rinse or a Nettie pot like product to help with clearing of the sinuses -Ibuprofen or Tylenol as needed for pain or fever -Fluids -Follow with primary care provider for this clinic should symptoms worsen or not improved.

## 2018-02-28 NOTE — ED Triage Notes (Signed)
Patient c/o cough that started yesterday. Today she has had vomiting and fever.

## 2018-02-28 NOTE — ED Provider Notes (Signed)
[facility]    CSN: [id number] Arrival date & time: 02/28/18  2001     History   Chief Complaint Chief Complaint  Patient presents with  . Cough  . Emesis    HPI Marie Colon is a 63 y.o. female.   Patient is a 63 year old female who presents with complaint of cough, fevers, chills that began yesterday.  Today she started having some nausea and vomiting, reporting 4-5 episodes of emesis today.  Patient also reports feeling weak.  She states she has taken Mucinex, sinus medication as well as a over-the-counter cough medication.  Patient reports her husband has had some sinus symptoms this weekend but is better today.  Patient has allergies to penicillin and Protonix.  Patient also reports her ears feeling stopped up and achy.  Patient denies any chest pain, shortness of breath, or abdominal pain.  Patient does report that she got the flu shot this season.     Past Medical History:  Diagnosis Date  . Acromegaly ([place])   . Angiomyolipoma of kidney    evaluated by Dr Bernardo Heater  . Fatty liver   . GERD (gastroesophageal reflux disease)   . Heart murmur    per Dr. Einar Pheasant  . History of pyelonephritis   . History of ulcerative colitis   . Hypercholesterolemia   . Hypertension   . Hypothyroidism   . Pituitary macroadenoma ([facility])    s/p removal (elevated growth hormone)  . Tubal pregnancy    s/p rupture  . Wears glasses     Patient Active Problem List   Diagnosis Date Noted  . Hyperglycemia 01/25/2018  . Gout 05/26/2015  . Soft tissue mass 01/06/2015  . Health care maintenance 04/29/2014  . BMI 33.0-33.9,adult 12/15/2013  . Renal insufficiency 04/05/2013  . Family history of breast cancer 12/11/2012  . Gastritis 11/28/2012  . Family history of colonic polyps 11/28/2012  . Pituitary macroadenoma ([place]) 12/05/2011  . Fatty liver 12/05/2011  . Angiomyolipoma of kidney 12/05/2011  . Hypothyroidism 12/05/2011  . Hypertension 11/24/2011  .  Hypercholesteremia 11/24/2011    Past Surgical History:  Procedure Laterality Date  . ABDOMINAL HYSTERECTOMY  2003   abnormal uterine bleeding  . APPENDECTOMY    . CHOLECYSTECTOMY  2005  . COLONOSCOPY WITH PROPOFOL N/A 03/18/2017   Procedure: COLONOSCOPY WITH PROPOFOL;  Surgeon: Lollie Sails, MD;  Location: [facility] ENDOSCOPY;  Service: Endoscopy;  Laterality: N/A;  . DILATION AND CURETTAGE OF UTERUS    . MASS EXCISION Right 08/29/2015   Procedure: EXCISION OF RIGHT THIGH MASS SUBFASICAL 15 CM;  Surgeon: Stark Klein, MD;  Location: [place];  Service: General;  Laterality: Right;  . pituitary tumor removal  2006    OB History   No obstetric history on file.      Home Medications    Prior to Admission medications   Medication Sig Start Date End Date Taking? Authorizing Provider  allopurinol (ZYLOPRIM) 100 MG tablet Take 1 tablet (100 mg total) by mouth daily. 09/24/17  Yes Einar Pheasant, MD  ALPRAZolam Duanne Moron) 0.25 MG tablet Take 1 tablet (0.25 mg total) by mouth 2 (two) times daily as needed for anxiety. Patient taking differently: Take 0.25 mg by mouth as needed for anxiety.  05/08/15  Yes Einar Pheasant, MD  aspirin EC 81 MG tablet Take 81 mg by mouth daily.   Yes [provider]  cabergoline (DOSTINEX) 0.5 MG tablet Take 1 tablet by mouth at bedtime. 04/13/16  Yes [provider]  colchicine 0.6 MG tablet Take 1 tablet (0.6 mg total) by mouth 2 (two) times daily. Patient taking differently: Take 0.6 mg by mouth as needed.  05/24/15  Yes Einar Pheasant, MD  lansoprazole (PREVACID) 30 MG capsule TAKE 1 CAPSULE BY MOUTH DAILY AT 12 NOON. 01/13/18  Yes Einar Pheasant, MD  levothyroxine (SYNTHROID, LEVOTHROID) 112 MCG tablet Take 1 tablet (112 mcg total) by mouth daily before breakfast. 09/24/17  Yes Einar Pheasant, MD  metoprolol tartrate (LOPRESSOR) 50 MG tablet TAKE ONE TABLET BY MOUTH 2 TIMES A DAY 01/13/18  Yes Einar Pheasant, MD  Multiple Vitamin  (MULTIVITAMIN) tablet Take 1 tablet by mouth daily.   Yes [provider]  polyethylene glycol powder (GLYCOLAX/MIRALAX) powder Take with your choice of fluid. This is safe to use on a daily basis if needed. 02/03/12  Yes Rey, Raquel M, NP  pravastatin (PRAVACHOL) 40 MG tablet TAKE 1 TABLET BY MOUTH DAILY 02/28/18  Yes Einar Pheasant, MD  triamterene-hydrochlorothiazide (DYAZIDE) 37.5-25 MG capsule TAKE 1 CAPSULE BY MOUTH DAILY. 01/13/18  Yes Einar Pheasant, MD  Baloxavir Marboxil,80 MG Dose, (XOFLUZA) 2 x 40 MG TBPK Take 80 mg by mouth once for 1 dose. 02/28/18 02/28/18  Luvenia Redden, PA-C  ondansetron (ZOFRAN ODT) 8 MG disintegrating tablet Take 1 tablet (8 mg total) by mouth every 8 (eight) hours as needed for nausea or vomiting. 02/28/18   Luvenia Redden, PA-C    Family History Family History  Problem Relation Age of Onset  . Brain cancer Father        died - 42  . Diabetes Mother   . Hypothyroidism Sister   . Breast cancer Sister 33  . Sjogren's syndrome Sister   . Breast cancer Sister 72  . Diabetes Other        grandmother    Social History Social History   Tobacco Use  . Smoking status: Never Smoker  . Smokeless tobacco: Never Used  Substance Use Topics  . Alcohol use: No    Alcohol/week: 0.0 standard drinks  . Drug use: No     Allergies   Protonix [pantoprazole] and Penicillins   Review of Systems Review of Systems  As noted above HPI.  Other systems reviewed and found to be negative.   Physical Exam Triage Vital Signs ED Triage Vitals  Enc Vitals Group     BP 02/28/18 2017 100/71     Pulse Rate 02/28/18 2017 (!) 105     Resp 02/28/18 2017 18     Temp 02/28/18 2017 (!) 100.7 F (38.2 C)     Temp Source 02/28/18 2017 Oral     SpO2 02/28/18 2017 97 %     Weight 02/28/18 2015 176 lb (79.8 kg)     Height 02/28/18 2015 5\' 1"  (1.549 m)     Head Circumference --      Peak Flow --      Pain Score 02/28/18 2015 0     Pain Loc --      Pain Edu? --       Excl. in [place]? --    No data found.  Updated Vital Signs BP 100/71 (BP Location: Left Arm)   Pulse (!) 105   Temp (!) 100.7 F (38.2 C) (Oral)   Resp 18   Ht 5\' 1"  (1.549 m)   Wt 176 lb (79.8 kg)   SpO2 97%   BMI 33.25 kg/m   Physical Exam Constitutional:      Appearance: She  is ill-appearing.  HENT:     Head: Normocephalic and atraumatic.     Ears:     Comments: Both ear canals obscured by cerumen.    Nose: Congestion and rhinorrhea present.     Mouth/Throat:     Pharynx: No posterior oropharyngeal erythema.  Eyes:     Extraocular Movements: Extraocular movements intact.     Pupils: Pupils are equal, round, and reactive to light.  Cardiovascular:     Rate and Rhythm: Regular rhythm. Tachycardia present.     Heart sounds: No murmur.  Pulmonary:     Breath sounds: No stridor. No wheezing or rhonchi.  Abdominal:     General: Abdomen is flat.     Palpations: Abdomen is soft.  Skin:    General: Skin is warm and dry.  Neurological:     General: No focal deficit present.     Mental Status: She is alert and oriented to person, place, and time.  Psychiatric:        Mood and Affect: Mood normal.        Behavior: Behavior normal.      UC Treatments / Results  Labs (all labs ordered are listed, but only abnormal results are displayed) Labs Reviewed - No data to display  EKG None  Radiology No results found.  Procedures Procedures (including critical care time)  Medications Ordered in UC Medications - No data to display  Initial Impression / Assessment and Plan / UC Course  I have reviewed the triage vital signs and the nursing notes.  Pertinent labs & imaging results that were available during my care of the patient were reviewed by me and considered in my medical decision making (see chart for details).     Patient presents with influenza-like illness symptoms that began yesterday.  Patient overall feeling weak.  Give her prescription for Tamiflu as  well as for Zofran.  Recommend over-the-counter Flonase or Zyrtec to help with nasal sinus congestion drainage.  Also recommend use of Nettie pot to help rinse out the sinuses to get help with the drainage.  Ibuprofen or Tylenol as needed for pain or fever and to push fluids. Final Clinical Impressions(s) / UC Diagnoses   Final diagnoses:  Influenza-like illness  Nausea and vomiting, intractability of vomiting not specified, unspecified vomiting type     Discharge Instructions     -Tamiflu: 1 tablet twice a day for 5 days -Zofran: 1 tab under the tongue every 8 hours as needed for nausea -Recommend over-the-counter Flonase or Zyrtec to help with nasal/sinus congestion and drainage -Can also use nasal saline rinse or a Nettie pot like product to help with clearing of the sinuses -Ibuprofen or Tylenol as needed for pain or fever -Fluids -Follow with primary care provider for this clinic should symptoms worsen or not improved.    ED Prescriptions    Medication Sig Dispense Auth. Provider   Baloxavir Marboxil,80 MG Dose, (XOFLUZA) 2 x 40 MG TBPK  (Status: Discontinued) Take 80 mg by mouth once for 1 dose. 2 each Luvenia Redden, PA-C   ondansetron (ZOFRAN ODT) 8 MG disintegrating tablet  (Status: Discontinued) Take 1 tablet (8 mg total) by mouth every 8 (eight) hours as needed for nausea or vomiting. 20 tablet Luvenia Redden, PA-C   Baloxavir Marboxil,80 MG Dose, (XOFLUZA) 2 x 40 MG TBPK Take 80 mg by mouth once for 1 dose. 2 each Luvenia Redden, PA-C   ondansetron (ZOFRAN ODT) 8 MG disintegrating tablet Take  1 tablet (8 mg total) by mouth every 8 (eight) hours as needed for nausea or vomiting. 20 tablet Luvenia Redden, PA-C     Initial prescription sent to the [facility] in [place].  That was discontinued and then sent over to [facility] which was more likely to be open this evening. Controlled Substance Prescriptions [place] Controlled Substance Registry consulted? Not Applicable     Luvenia Redden, PA-C 02/28/18 2147

## 2018-03-04 ENCOUNTER — Other Ambulatory Visit: Payer: Self-pay

## 2018-03-04 ENCOUNTER — Encounter: Payer: Self-pay | Admitting: Internal Medicine

## 2018-03-04 MED ORDER — BENZONATATE 100 MG PO CAPS: 100.0000 mg | ORAL_CAPSULE | Freq: Three times a day (TID) | ORAL | 0 refills | Status: DC | PRN

## 2018-03-04 NOTE — Telephone Encounter (Signed)
LMTCB

## 2018-03-04 NOTE — Telephone Encounter (Signed)
Tessalon perles sent in to [facility] as requested. Advised pt to follow up next week

## 2018-03-04 NOTE — Telephone Encounter (Signed)
If pt is better, just persistent cough, ok to send in tessalon perles for her or prescription cough medication?

## 2018-03-04 NOTE — Telephone Encounter (Signed)
If better and just needs something for cough, I am ok with tessalon perles 100mg  tid prn cough #21 with no refills.  If persistent problems, let us know.

## 2018-04-05 DIAGNOSIS — E039 Hypothyroidism, unspecified: Secondary | ICD-10-CM | POA: Diagnosis not present

## 2018-04-05 DIAGNOSIS — Z87898 Personal history of other specified conditions: Secondary | ICD-10-CM | POA: Diagnosis not present

## 2018-04-05 DIAGNOSIS — E220 Acromegaly and pituitary gigantism: Secondary | ICD-10-CM | POA: Diagnosis not present

## 2018-04-06 ENCOUNTER — Ambulatory Visit
Admission: RE | Admit: 2018-04-06 | Discharge: 2018-04-06 | Disposition: A | Discharge status: Home / Self Care | Payer: 59 | Source: Ambulatory Visit | Attending: Internal Medicine | Admitting: Internal Medicine

## 2018-04-06 ENCOUNTER — Other Ambulatory Visit: Payer: Self-pay

## 2018-04-06 DIAGNOSIS — Z1239 Encounter for other screening for malignant neoplasm of breast: Secondary | ICD-10-CM

## 2018-04-06 DIAGNOSIS — Z1231 Encounter for screening mammogram for malignant neoplasm of breast: Secondary | ICD-10-CM | POA: Insufficient documentation

## 2018-04-25 ENCOUNTER — Encounter: Payer: Self-pay | Admitting: Internal Medicine

## 2018-05-30 MED FILL — METOPROLOL TARTRATE 50 MG T: 50 | 90 days supply | Qty: 180 | Fill #0 | Status: TO

## 2018-05-30 MED FILL — TRIAMTERENE/HCTZ 37.5/25 CP: 37.5-25 | 90 days supply | Qty: 90 | Fill #0 | Status: TO

## 2018-05-30 MED FILL — LANSOPRAZOLE 30 MG CPDR: 30 | 90 days supply | Qty: 90 | Fill #0 | Status: TO

## 2018-06-21 ENCOUNTER — Other Ambulatory Visit: Payer: Self-pay | Admitting: Internal Medicine

## 2018-07-08 ENCOUNTER — Encounter: Payer: Self-pay | Admitting: Internal Medicine

## 2018-07-08 ENCOUNTER — Telehealth: Payer: Self-pay

## 2018-07-08 DIAGNOSIS — E220 Acromegaly and pituitary gigantism: Secondary | ICD-10-CM | POA: Diagnosis not present

## 2018-07-08 DIAGNOSIS — I10 Essential (primary) hypertension: Secondary | ICD-10-CM

## 2018-07-08 DIAGNOSIS — R739 Hyperglycemia, unspecified: Secondary | ICD-10-CM

## 2018-07-08 DIAGNOSIS — E039 Hypothyroidism, unspecified: Secondary | ICD-10-CM

## 2018-07-08 DIAGNOSIS — E7800 Pure hypercholesterolemia, unspecified: Secondary | ICD-10-CM

## 2018-07-08 NOTE — Telephone Encounter (Signed)
Labs ordered for [place] notified.

## 2018-07-08 NOTE — Telephone Encounter (Signed)
Copied from [place] [id number]. Topic: General - Other >> Jul 08, 2018 11:33 AM Leward Quan A wrote: Reason for CRM: [facility] called to get copy of patients requested labs faxed over to them at Fax# [phone]. Patient was in to have labs done but will be returning this afternoon. Please advise

## 2018-07-11 DIAGNOSIS — I10 Essential (primary) hypertension: Secondary | ICD-10-CM | POA: Diagnosis not present

## 2018-07-11 DIAGNOSIS — R739 Hyperglycemia, unspecified: Secondary | ICD-10-CM | POA: Diagnosis not present

## 2018-07-11 DIAGNOSIS — E039 Hypothyroidism, unspecified: Secondary | ICD-10-CM | POA: Diagnosis not present

## 2018-07-11 DIAGNOSIS — E7800 Pure hypercholesterolemia, unspecified: Secondary | ICD-10-CM | POA: Diagnosis not present

## 2018-07-12 ENCOUNTER — Telehealth: Payer: Self-pay | Admitting: Internal Medicine

## 2018-07-12 DIAGNOSIS — E039 Hypothyroidism, unspecified: Secondary | ICD-10-CM

## 2018-07-12 LAB — HEPATIC FUNCTION PANEL
ALT: 63 IU/L — ABNORMAL HIGH (ref 0–32)
AST: 47 IU/L — ABNORMAL HIGH (ref 0–40)
Albumin: 4.4 g/dL (ref 3.8–4.8)
Alkaline Phosphatase: 86 IU/L (ref 39–117)
Bilirubin Total: 0.3 mg/dL (ref 0.0–1.2)
Bilirubin, Direct: 0.1 mg/dL (ref 0.00–0.40)
Total Protein: 6.7 g/dL (ref 6.0–8.5)

## 2018-07-12 LAB — LIPID PANEL
Chol/HDL Ratio: 4.5 ratio — ABNORMAL HIGH (ref 0.0–4.4)
Cholesterol, Total: 195 mg/dL (ref 100–199)
HDL: 43 mg/dL (ref >39)
LDL Calculated: 114 mg/dL — ABNORMAL HIGH (ref 0–99)
Triglycerides: 190 mg/dL — ABNORMAL HIGH (ref 0–149)
VLDL Cholesterol Cal: 38 mg/dL (ref 5–40)

## 2018-07-12 LAB — BASIC METABOLIC PANEL
BUN/Creatinine Ratio: 11 — ABNORMAL LOW (ref 12–28)
BUN: 12 mg/dL (ref 8–27)
CO2: 26 mmol/L (ref 20–29)
Calcium: 9.6 mg/dL (ref 8.7–10.3)
Chloride: 102 mmol/L (ref 96–106)
Creatinine, Ser: 1.07 mg/dL — ABNORMAL HIGH (ref 0.57–1.00)
GFR calc Af Amer: 64 mL/min/{1.73_m2} (ref >59)
GFR calc non Af Amer: 55 mL/min/{1.73_m2} — ABNORMAL LOW (ref >59)
Glucose: 97 mg/dL (ref 65–99)
Potassium: 3.9 mmol/L (ref 3.5–5.2)
Sodium: 144 mmol/L (ref 134–144)

## 2018-07-12 LAB — HEMOGLOBIN A1C
Est. average glucose Bld gHb Est-mCnc: 128 mg/dL
Hgb A1c MFr Bld: 6.1 % — ABNORMAL HIGH (ref 4.8–5.6)

## 2018-07-12 LAB — TSH: TSH: 0.402 u[IU]/mL — ABNORMAL LOW (ref 0.450–4.500)

## 2018-07-12 NOTE — Telephone Encounter (Signed)
Placed for f/u tsh to be drawn at [facility].

## 2018-07-12 NOTE — Telephone Encounter (Signed)
Labs were drawn yesterday

## 2018-07-15 ENCOUNTER — Telehealth: Payer: Self-pay | Admitting: Lab

## 2018-07-15 NOTE — Telephone Encounter (Signed)
Called Pt to confirm appt for Office visit 07/22/18. Pt stated she still has not received her levothyroxine 100 mcg at the pharmacy.

## 2018-07-16 ENCOUNTER — Other Ambulatory Visit: Payer: Self-pay | Admitting: Internal Medicine

## 2018-07-16 ENCOUNTER — Telehealth: Payer: Self-pay | Admitting: Internal Medicine

## 2018-07-16 MED ORDER — LEVOTHYROXINE SODIUM 100 MCG PO TABS: 100.0000 ug | ORAL_TABLET | Freq: Every day | ORAL | 2 refills | Status: DC

## 2018-07-16 NOTE — Progress Notes (Signed)
rx sent in for synthroid 143mcg q day #30 with 2 refills.

## 2018-07-16 NOTE — Telephone Encounter (Signed)
rx sent in to synthroid 15mcg #30 with 2 refills.

## 2018-07-16 NOTE — Telephone Encounter (Signed)
My chart message sent to pt to notify synthroid medication sent in to pharmacy.

## 2018-07-22 ENCOUNTER — Other Ambulatory Visit: Payer: Self-pay

## 2018-07-22 ENCOUNTER — Encounter: Payer: Self-pay | Admitting: Internal Medicine

## 2018-07-22 ENCOUNTER — Ambulatory Visit (INDEPENDENT_AMBULATORY_CARE_PROVIDER_SITE_OTHER): Payer: 59 | Admitting: Internal Medicine

## 2018-07-22 DIAGNOSIS — E7800 Pure hypercholesterolemia, unspecified: Secondary | ICD-10-CM | POA: Diagnosis not present

## 2018-07-22 DIAGNOSIS — R739 Hyperglycemia, unspecified: Secondary | ICD-10-CM | POA: Diagnosis not present

## 2018-07-22 DIAGNOSIS — D352 Benign neoplasm of pituitary gland: Secondary | ICD-10-CM

## 2018-07-22 DIAGNOSIS — I10 Essential (primary) hypertension: Secondary | ICD-10-CM

## 2018-07-22 DIAGNOSIS — K760 Fatty (change of) liver, not elsewhere classified: Secondary | ICD-10-CM

## 2018-07-22 DIAGNOSIS — E039 Hypothyroidism, unspecified: Secondary | ICD-10-CM | POA: Diagnosis not present

## 2018-07-22 NOTE — Progress Notes (Signed)
Patient ID: Marie Colon, female   DOB: [birth year], 63 y.o.   MRN: [id number]   Virtual Visit via video Note  This visit type was conducted due to national recommendations for restrictions regarding the COVID-19 pandemic (e.g. social distancing).  This format is felt to be most appropriate for this patient at this time.  All issues noted in this document were discussed and addressed.  No physical exam was performed (except for noted visual exam findings with Video Visits).   I connected with Elmer Picker by a video enabled telemedicine application or telephone and verified that I am speaking with the correct person using two identifiers. Location patient: home Location provider: work Persons participating in the virtual visit: patient, provider  I discussed the limitations, risks, security and privacy concerns of performing an evaluation and management service by video and the availability of in person appointments.  The patient expressed understanding and agreed to proceed.   Reason for visit: scheduled follow up.   HPI: She reports she is doing well.  Feels good.  Increased stress.  Mother-n-law has been diagnosed with dementia.  Increased stress with work.  Overall feels she is handling things relatively well.  Does not feel needs any further intervention.  No chest pain.  No sob.  No acid reflux.  No abdominal pain.  Bowels moving.  Has f/u with Dr Gabriel Carina next week.  Just adjusted thyroid dose.  Is walking.     ROS: See pertinent positives and negatives per HPI.  Past Medical History:  Diagnosis Date  . Acromegaly ([place])   . Angiomyolipoma of kidney    evaluated by Dr Bernardo Heater  . Fatty liver   . GERD (gastroesophageal reflux disease)   . Heart murmur    per Dr. Einar Pheasant  . History of pyelonephritis   . History of ulcerative colitis   . Hypercholesterolemia   . Hypertension   . Hypothyroidism   . Pituitary macroadenoma ([facility])    s/p removal (elevated growth hormone)   . Tubal pregnancy    s/p rupture  . Wears glasses     Past Surgical History:  Procedure Laterality Date  . ABDOMINAL HYSTERECTOMY  2003   abnormal uterine bleeding  . APPENDECTOMY    . CHOLECYSTECTOMY  2005  . COLONOSCOPY WITH PROPOFOL N/A 03/18/2017   Procedure: COLONOSCOPY WITH PROPOFOL;  Surgeon: Lollie Sails, MD;  Location: [facility] ENDOSCOPY;  Service: Endoscopy;  Laterality: N/A;  . DILATION AND CURETTAGE OF UTERUS    . MASS EXCISION Right 08/29/2015   Procedure: EXCISION OF RIGHT THIGH MASS SUBFASICAL 15 CM;  Surgeon: Stark Klein, MD;  Location: [facility];  Service: General;  Laterality: Right;  . pituitary tumor removal  2006    Family History  Problem Relation Age of Onset  . Brain cancer Father        died - 90  . Diabetes Mother   . Hypothyroidism Sister   . Breast cancer Sister 21  . Sjogren's syndrome Sister   . Breast cancer Sister 72  . Diabetes Other        grandmother    SOCIAL HX: reviewed.    Current Outpatient Medications:  .  allopurinol (ZYLOPRIM) 100 MG tablet, Take 1 tablet (100 mg total) by mouth daily., Disp: 90 tablet, Rfl: 3 .  ALPRAZolam (XANAX) 0.25 MG tablet, Take 1 tablet (0.25 mg total) by mouth 2 (two) times daily as needed for anxiety. (Patient taking differently: Take 0.25 mg by mouth as needed for  anxiety. ), Disp: 20 tablet, Rfl: 0 .  aspirin EC 81 MG tablet, Take 81 mg by mouth daily., Disp: , Rfl:  .  benzonatate (TESSALON) 100 MG capsule, Take 1 capsule (100 mg total) by mouth 3 (three) times daily as needed for cough., Disp: 21 capsule, Rfl: 0 .  cabergoline (DOSTINEX) 0.5 MG tablet, Take 1 tablet by mouth at bedtime., Disp: , Rfl:  .  colchicine 0.6 MG tablet, Take 1 tablet (0.6 mg total) by mouth 2 (two) times daily. (Patient taking differently: Take 0.6 mg by mouth as needed. ), Disp: 60 tablet, Rfl: 0 .  lansoprazole (PREVACID) 30 MG capsule, TAKE 1 CAPSULE BY MOUTH DAILY AT 12 NOON., Disp: 90 capsule, Rfl: 3 .  levothyroxine  (SYNTHROID) 100 MCG tablet, Take 1 tablet (100 mcg total) by mouth daily before breakfast., Disp: 30 tablet, Rfl: 2 .  metoprolol tartrate (LOPRESSOR) 50 MG tablet, TAKE ONE TABLET BY MOUTH 2 TIMES A DAY, Disp: 180 tablet, Rfl: 3 .  Multiple Vitamin (MULTIVITAMIN) tablet, Take 1 tablet by mouth daily., Disp: , Rfl:  .  ondansetron (ZOFRAN ODT) 8 MG disintegrating tablet, Take 1 tablet (8 mg total) by mouth every 8 (eight) hours as needed for nausea or vomiting., Disp: 20 tablet, Rfl: 0 .  polyethylene glycol powder (GLYCOLAX/MIRALAX) powder, Take with your choice of fluid. This is safe to use on a daily basis if needed., Disp: 3350 g, Rfl: 1 .  pravastatin (PRAVACHOL) 40 MG tablet, TAKE 1 TABLET BY MOUTH DAILY, Disp: 90 tablet, Rfl: 1 .  triamterene-hydrochlorothiazide (DYAZIDE) 37.5-25 MG capsule, TAKE 1 CAPSULE BY MOUTH DAILY., Disp: 90 capsule, Rfl: 3  EXAM:  GENERAL: alert, oriented, appears well and in no acute distress  HEENT: atraumatic, conjunttiva clear, no obvious abnormalities on inspection of external nose and ears  NECK: normal movements of the head and neck  LUNGS: on inspection no signs of respiratory distress, breathing rate appears normal, no obvious gross SOB, gasping or wheezing  CV: no obvious cyanosis  PSYCH/NEURO: pleasant and cooperative, no obvious depression or anxiety, speech and thought processing grossly intact  ASSESSMENT AND PLAN:  Discussed the following assessment and plan:  Fatty liver Diet, exercise and weight loss.  Has seen GI.  Follow liver function tests.    Hypercholesteremia On pravastatin.  Low cholesterol diet and exercise.  Follow lipid panel and liver function tests.    Hyperglycemia Low carb diet and exercise.  Follow met b and a1c.   Hypothyroidism On thyroid replacement.  Dose just adjusted.  Follow tsh.    Hypertension Blood pressure has been under good control.  Continue current medication regimen.  Follow pressures.  Follow  metabolic panel.    Pituitary macroadenoma Followed by Dr Gabriel Carina.  Has f/u planned next week. Has been stable.      I discussed the assessment and treatment plan with the patient. The patient was provided an opportunity to ask questions and all were answered. The patient agreed with the plan and demonstrated an understanding of the instructions.   The patient was advised to call back or seek an in-person evaluation if the symptoms worsen or if the condition fails to improve as anticipated.  I provided 20 minutes of non-face-to-face time during this encounter.   Einar Pheasant, MD

## 2018-07-24 ENCOUNTER — Encounter: Payer: Self-pay | Admitting: Internal Medicine

## 2018-07-24 NOTE — Assessment & Plan Note (Signed)
Low carb diet and exercise.  Follow met b and a1c.  

## 2018-07-24 NOTE — Assessment & Plan Note (Signed)
Followed by Dr Gabriel Carina.  Has f/u planned next week. Has been stable.

## 2018-07-24 NOTE — Assessment & Plan Note (Signed)
Blood pressure has been under good control.  Continue current medication regimen.  Follow pressures.  Follow metabolic panel.  

## 2018-07-24 NOTE — Assessment & Plan Note (Signed)
On thyroid replacement.  Dose just adjusted.  Follow tsh.   

## 2018-07-24 NOTE — Assessment & Plan Note (Signed)
Diet, exercise and weight loss.  Has seen GI.  Follow liver function tests.

## 2018-07-24 NOTE — Assessment & Plan Note (Signed)
On pravastatin.  Low cholesterol diet and exercise.  Follow lipid panel and liver function tests.   

## 2018-07-26 DIAGNOSIS — Z87898 Personal history of other specified conditions: Secondary | ICD-10-CM | POA: Diagnosis not present

## 2018-07-26 DIAGNOSIS — E039 Hypothyroidism, unspecified: Secondary | ICD-10-CM | POA: Diagnosis not present

## 2018-07-26 DIAGNOSIS — E220 Acromegaly and pituitary gigantism: Secondary | ICD-10-CM | POA: Diagnosis not present

## 2018-09-01 DIAGNOSIS — E039 Hypothyroidism, unspecified: Secondary | ICD-10-CM | POA: Diagnosis not present

## 2018-09-02 ENCOUNTER — Encounter: Payer: Self-pay | Admitting: Internal Medicine

## 2018-09-02 LAB — TSH: TSH: 0.984 u[IU]/mL (ref 0.450–4.500)

## 2018-09-07 DIAGNOSIS — D1800 Hemangioma unspecified site: Secondary | ICD-10-CM | POA: Diagnosis not present

## 2018-09-07 DIAGNOSIS — Z1283 Encounter for screening for malignant neoplasm of skin: Secondary | ICD-10-CM | POA: Diagnosis not present

## 2018-09-07 DIAGNOSIS — D2230 Melanocytic nevi of unspecified part of face: Secondary | ICD-10-CM | POA: Diagnosis not present

## 2018-09-07 DIAGNOSIS — L578 Other skin changes due to chronic exposure to nonionizing radiation: Secondary | ICD-10-CM | POA: Diagnosis not present

## 2018-09-07 DIAGNOSIS — D485 Neoplasm of uncertain behavior of skin: Secondary | ICD-10-CM | POA: Diagnosis not present

## 2018-09-07 DIAGNOSIS — L814 Other melanin hyperpigmentation: Secondary | ICD-10-CM | POA: Diagnosis not present

## 2018-09-07 DIAGNOSIS — L989 Disorder of the skin and subcutaneous tissue, unspecified: Secondary | ICD-10-CM | POA: Diagnosis not present

## 2018-09-07 DIAGNOSIS — L821 Other seborrheic keratosis: Secondary | ICD-10-CM | POA: Diagnosis not present

## 2018-09-07 DIAGNOSIS — L820 Inflamed seborrheic keratosis: Secondary | ICD-10-CM | POA: Diagnosis not present

## 2018-09-07 DIAGNOSIS — Z86018 Personal history of other benign neoplasm: Secondary | ICD-10-CM | POA: Diagnosis not present

## 2018-10-10 ENCOUNTER — Other Ambulatory Visit: Payer: Self-pay | Admitting: Internal Medicine

## 2018-11-08 ENCOUNTER — Other Ambulatory Visit: Payer: Self-pay | Admitting: Internal Medicine

## 2018-12-13 ENCOUNTER — Other Ambulatory Visit: Payer: Self-pay | Admitting: Internal Medicine

## 2019-01-10 ENCOUNTER — Other Ambulatory Visit: Payer: Self-pay | Admitting: Internal Medicine

## 2019-01-13 DIAGNOSIS — E039 Hypothyroidism, unspecified: Secondary | ICD-10-CM | POA: Diagnosis not present

## 2019-01-13 DIAGNOSIS — E220 Acromegaly and pituitary gigantism: Secondary | ICD-10-CM | POA: Diagnosis not present

## 2019-01-14 LAB — BASIC METABOLIC PANEL
BUN: 12 (ref 4–21)
CO2: 25 — AB (ref 13–22)
Chloride: 100 (ref 99–108)
Creatinine: 1.1 (ref 0.5–1.1)
Glucose: 101
Potassium: 3.8 (ref 3.4–5.3)
Sodium: 141 (ref 137–147)

## 2019-01-14 LAB — TSH: TSH: 1.49 (ref 0.41–5.90)

## 2019-01-14 LAB — HEPATIC FUNCTION PANEL
ALT: 59 — AB (ref 7–35)
AST: 47 — AB (ref 13–35)
Alkaline Phosphatase: 88 (ref 25–125)
Bilirubin, Total: 0.3

## 2019-01-14 LAB — COMPREHENSIVE METABOLIC PANEL
Albumin: 4.2 (ref 3.5–5.0)
GFR calc Af Amer: 65
GFR calc non Af Amer: 56
Globulin: 2

## 2019-01-24 DIAGNOSIS — E220 Acromegaly and pituitary gigantism: Secondary | ICD-10-CM | POA: Diagnosis not present

## 2019-02-01 ENCOUNTER — Encounter: Payer: Self-pay | Admitting: Internal Medicine

## 2019-02-08 ENCOUNTER — Telehealth: Payer: Self-pay

## 2019-02-08 DIAGNOSIS — R739 Hyperglycemia, unspecified: Secondary | ICD-10-CM

## 2019-02-08 DIAGNOSIS — E7800 Pure hypercholesterolemia, unspecified: Secondary | ICD-10-CM

## 2019-02-08 DIAGNOSIS — I10 Essential (primary) hypertension: Secondary | ICD-10-CM

## 2019-02-08 NOTE — Telephone Encounter (Signed)
Fasting lab orders placed to be done at [facility] for her appt in Feb. Pt aware

## 2019-02-25 ENCOUNTER — Other Ambulatory Visit: Payer: Self-pay | Admitting: Internal Medicine

## 2019-03-20 ENCOUNTER — Encounter: Payer: 59 | Admitting: Internal Medicine

## 2019-03-20 DIAGNOSIS — I10 Essential (primary) hypertension: Secondary | ICD-10-CM | POA: Diagnosis not present

## 2019-03-20 DIAGNOSIS — R739 Hyperglycemia, unspecified: Secondary | ICD-10-CM | POA: Diagnosis not present

## 2019-03-20 DIAGNOSIS — E7800 Pure hypercholesterolemia, unspecified: Secondary | ICD-10-CM | POA: Diagnosis not present

## 2019-03-21 ENCOUNTER — Telehealth: Payer: Self-pay

## 2019-03-21 DIAGNOSIS — Z1231 Encounter for screening mammogram for malignant neoplasm of breast: Secondary | ICD-10-CM

## 2019-03-21 LAB — HEPATIC FUNCTION PANEL
ALT: 54 IU/L — ABNORMAL HIGH (ref 0–32)
AST: 42 IU/L — ABNORMAL HIGH (ref 0–40)
Albumin: 4.6 g/dL (ref 3.8–4.8)
Alkaline Phosphatase: 90 IU/L (ref 39–117)
Bilirubin Total: 0.3 mg/dL (ref 0.0–1.2)
Bilirubin, Direct: 0.11 mg/dL (ref 0.00–0.40)
Total Protein: 6.3 g/dL (ref 6.0–8.5)

## 2019-03-21 LAB — CBC WITH DIFFERENTIAL/PLATELET
Basophils Absolute: 0 10*3/uL (ref 0.0–0.2)
Basos: 1 %
EOS (ABSOLUTE): 0.2 10*3/uL (ref 0.0–0.4)
Eos: 2 %
Hematocrit: 40.6 % (ref 34.0–46.6)
Hemoglobin: 14.2 g/dL (ref 11.1–15.9)
Immature Grans (Abs): 0 10*3/uL (ref 0.0–0.1)
Immature Granulocytes: 0 %
Lymphocytes Absolute: 1.8 10*3/uL (ref 0.7–3.1)
Lymphs: 28 %
MCH: 29.8 pg (ref 26.6–33.0)
MCHC: 35 g/dL (ref 31.5–35.7)
MCV: 85 fL (ref 79–97)
Monocytes Absolute: 0.6 10*3/uL (ref 0.1–0.9)
Monocytes: 10 %
Neutrophils Absolute: 3.9 10*3/uL (ref 1.4–7.0)
Neutrophils: 59 %
Platelets: 205 10*3/uL (ref 150–450)
RBC: 4.76 x10E6/uL (ref 3.77–5.28)
RDW: 13 % (ref 11.7–15.4)
WBC: 6.4 10*3/uL (ref 3.4–10.8)

## 2019-03-21 LAB — BASIC METABOLIC PANEL
BUN/Creatinine Ratio: 10 — ABNORMAL LOW (ref 12–28)
BUN: 12 mg/dL (ref 8–27)
CO2: 26 mmol/L (ref 20–29)
Calcium: 9.6 mg/dL (ref 8.7–10.3)
Chloride: 101 mmol/L (ref 96–106)
Creatinine, Ser: 1.2 mg/dL — ABNORMAL HIGH (ref 0.57–1.00)
GFR calc Af Amer: 56 mL/min/{1.73_m2} — ABNORMAL LOW (ref >59)
GFR calc non Af Amer: 48 mL/min/{1.73_m2} — ABNORMAL LOW (ref >59)
Glucose: 105 mg/dL — ABNORMAL HIGH (ref 65–99)
Potassium: 3.7 mmol/L (ref 3.5–5.2)
Sodium: 143 mmol/L (ref 134–144)

## 2019-03-21 LAB — LIPID PANEL
Chol/HDL Ratio: 4.1 ratio (ref 0.0–4.4)
Cholesterol, Total: 182 mg/dL (ref 100–199)
HDL: 44 mg/dL (ref >39)
LDL Chol Calc (NIH): 114 mg/dL — ABNORMAL HIGH (ref 0–99)
Triglycerides: 137 mg/dL (ref 0–149)
VLDL Cholesterol Cal: 24 mg/dL (ref 5–40)

## 2019-03-21 LAB — TSH: TSH: 1.07 u[IU]/mL (ref 0.450–4.500)

## 2019-03-21 LAB — HEMOGLOBIN A1C
Est. average glucose Bld gHb Est-mCnc: 128 mg/dL
Hgb A1c MFr Bld: 6.1 % — ABNORMAL HIGH (ref 4.8–5.6)

## 2019-03-21 NOTE — Telephone Encounter (Signed)
Mammogram ordered

## 2019-03-22 ENCOUNTER — Other Ambulatory Visit: Payer: Self-pay | Admitting: Internal Medicine

## 2019-03-22 ENCOUNTER — Encounter: Payer: Self-pay | Admitting: Internal Medicine

## 2019-03-22 DIAGNOSIS — R944 Abnormal results of kidney function studies: Secondary | ICD-10-CM

## 2019-03-22 NOTE — Progress Notes (Signed)
Order placed for renal ultrasound.  

## 2019-03-24 ENCOUNTER — Encounter: Payer: 59 | Admitting: Internal Medicine

## 2019-03-27 ENCOUNTER — Other Ambulatory Visit: Payer: Self-pay

## 2019-03-27 ENCOUNTER — Ambulatory Visit (INDEPENDENT_AMBULATORY_CARE_PROVIDER_SITE_OTHER): Payer: 59 | Admitting: Internal Medicine

## 2019-03-27 ENCOUNTER — Encounter: Payer: Self-pay | Admitting: Internal Medicine

## 2019-03-27 ENCOUNTER — Other Ambulatory Visit: Payer: Self-pay | Admitting: Internal Medicine

## 2019-03-27 VITALS — BP 124/64 | HR 73 | Temp 97.3°F | Resp 16 | Ht 61.0 in | Wt 172.6 lb

## 2019-03-27 DIAGNOSIS — R739 Hyperglycemia, unspecified: Secondary | ICD-10-CM

## 2019-03-27 DIAGNOSIS — E039 Hypothyroidism, unspecified: Secondary | ICD-10-CM | POA: Diagnosis not present

## 2019-03-27 DIAGNOSIS — D352 Benign neoplasm of pituitary gland: Secondary | ICD-10-CM

## 2019-03-27 DIAGNOSIS — Z1231 Encounter for screening mammogram for malignant neoplasm of breast: Secondary | ICD-10-CM

## 2019-03-27 DIAGNOSIS — Z0000 Encounter for general adult medical examination without abnormal findings: Secondary | ICD-10-CM

## 2019-03-27 DIAGNOSIS — D1771 Benign lipomatous neoplasm of kidney: Secondary | ICD-10-CM | POA: Diagnosis not present

## 2019-03-27 DIAGNOSIS — E7800 Pure hypercholesterolemia, unspecified: Secondary | ICD-10-CM

## 2019-03-27 DIAGNOSIS — N644 Mastodynia: Secondary | ICD-10-CM | POA: Diagnosis not present

## 2019-03-27 DIAGNOSIS — N289 Disorder of kidney and ureter, unspecified: Secondary | ICD-10-CM

## 2019-03-27 DIAGNOSIS — I10 Essential (primary) hypertension: Secondary | ICD-10-CM

## 2019-03-27 DIAGNOSIS — K760 Fatty (change of) liver, not elsewhere classified: Secondary | ICD-10-CM | POA: Diagnosis not present

## 2019-03-27 NOTE — Progress Notes (Signed)
Patient ID: Marie Colon, female   DOB: 1955-08-08, 64 y.o.   MRN: [id number]   Subjective:    Patient ID: Marie Colon, female    DOB: 08-09-1955, 64 y.o.   MRN: [id number]  HPI This visit occurred during the SARS-CoV-2 public health emergency.  Safety protocols were in place, including screening questions prior to the visit, additional usage of staff PPE, and extensive cleaning of exam room while observing appropriate contact time as indicated for disinfecting solutions.  Patient here for her physical exam.  She reports she is doing relatively well.  Does report some left breast tenderness.  Present for one month.  No nodule.  No nipple retraction or nipple discharge present.  Tries to stay active.  No chest pain.  Breathing stable.  No acid reflux reported.  No abdominal pain.  Bowels moving.  Some right knee discomfort.  Uses a pillow at night for cushion.  Sees Dr Gabriel Carina for f/u acromegaly.  Felt stable. Planning for f/u in 9 months.  Handling stress.  Work is busy.    Past Medical History:  Diagnosis Date  . Acromegaly ([place])   . Angiomyolipoma of kidney    evaluated by Dr Bernardo Heater  . Fatty liver   . GERD (gastroesophageal reflux disease)   . Heart murmur    per Dr. Einar Pheasant  . History of pyelonephritis   . History of ulcerative colitis   . Hypercholesterolemia   . Hypertension   . Hypothyroidism   . Pituitary macroadenoma ([facility])    s/p removal (elevated growth hormone)  . Tubal pregnancy    s/p rupture  . Wears glasses    Past Surgical History:  Procedure Laterality Date  . ABDOMINAL HYSTERECTOMY  2003   abnormal uterine bleeding  . APPENDECTOMY    . CHOLECYSTECTOMY  2005  . COLONOSCOPY WITH PROPOFOL N/A 03/18/2017   Procedure: COLONOSCOPY WITH PROPOFOL;  Surgeon: Lollie Sails, MD;  Location: [facility] ENDOSCOPY;  Service: Endoscopy;  Laterality: N/A;  . DILATION AND CURETTAGE OF UTERUS    . MASS EXCISION Right 08/29/2015   Procedure: EXCISION OF RIGHT THIGH MASS  SUBFASICAL 15 CM;  Surgeon: Stark Klein, MD;  Location: [facility];  Service: General;  Laterality: Right;  . pituitary tumor removal  2006   Family History  Problem Relation Age of Onset  . Brain cancer Father        died - 58  . Diabetes Mother   . Hypothyroidism Sister   . Breast cancer Sister 2  . Sjogren's syndrome Sister   . Breast cancer Sister 41  . Diabetes Other        grandmother   Social History   Socioeconomic History  . Marital status: Married    Spouse name: Not on file  . Number of children: 2  . Years of education: Not on file  . Highest education level: Not on file  Occupational History    Employer: [employer]    Comment: [employer] - [employer]  Tobacco Use  . Smoking status: Never Smoker  . Smokeless tobacco: Never Used  Substance and Sexual Activity  . Alcohol use: No    Alcohol/week: 0.0 standard drinks  . Drug use: No  . Sexual activity: Not on file  Other Topics Concern  . Not on file  Social History Narrative  . Not on file   Social Determinants of Health   Financial Resource Strain:   . Difficulty of Paying Living Expenses: Not on file  Food  Insecurity:   . Worried About Charity fundraiser in the Last Year: Not on file  . Ran Out of Food in the Last Year: Not on file  Transportation Needs:   . Lack of Transportation (Medical): Not on file  . Lack of Transportation (Non-Medical): Not on file  Physical Activity:   . Days of Exercise per Week: Not on file  . Minutes of Exercise per Session: Not on file  Stress:   . Feeling of Stress : Not on file  Social Connections:   . Frequency of Communication with Friends and Family: Not on file  . Frequency of Social Gatherings with Friends and Family: Not on file  . Attends Religious Services: Not on file  . Active Member of Clubs or Organizations: Not on file  . Attends Archivist Meetings: Not on file  . Marital Status: Not on file    Outpatient Encounter Medications as of 03/27/2019  Medication  Sig  . allopurinol (ZYLOPRIM) 100 MG tablet TAKE 1 TABLET BY MOUTH DAILY  . ALPRAZolam (XANAX) 0.25 MG tablet Take 1 tablet (0.25 mg total) by mouth 2 (two) times daily as needed for anxiety. (Patient taking differently: Take 0.25 mg by mouth as needed for anxiety. )  . aspirin EC 81 MG tablet Take 81 mg by mouth daily.  . benzonatate (TESSALON) 100 MG capsule Take 1 capsule (100 mg total) by mouth 3 (three) times daily as needed for cough.  . cabergoline (DOSTINEX) 0.5 MG tablet Take 1 tablet by mouth at bedtime.  . colchicine 0.6 MG tablet Take 1 tablet (0.6 mg total) by mouth 2 (two) times daily. (Patient taking differently: Take 0.6 mg by mouth as needed. )  . lansoprazole (PREVACID) 30 MG capsule TAKE 1 CAPSULE BY MOUTH DAILY AT 12 NOON.  Marland Kitchen levothyroxine (SYNTHROID) 100 MCG tablet TAKE 1 TABLET BY MOUTH DAILY BEFORE BREAKFAST.  . metoprolol tartrate (LOPRESSOR) 50 MG tablet TAKE ONE TABLET BY MOUTH 2 TIMES A DAY  . Multiple Vitamin (MULTIVITAMIN) tablet Take 1 tablet by mouth daily.  . ondansetron (ZOFRAN ODT) 8 MG disintegrating tablet Take 1 tablet (8 mg total) by mouth every 8 (eight) hours as needed for nausea or vomiting.  . polyethylene glycol powder (GLYCOLAX/MIRALAX) powder Take with your choice of fluid. This is safe to use on a daily basis if needed.  . triamterene-hydrochlorothiazide (DYAZIDE) 37.5-25 MG capsule TAKE 1 CAPSULE BY MOUTH DAILY.  . [DISCONTINUED] pravastatin (PRAVACHOL) 40 MG tablet TAKE 1 TABLET BY MOUTH DAILY   No facility-administered encounter medications on file as of 03/27/2019.    Review of Systems  Constitutional: Negative for appetite change and unexpected weight change.  HENT: Negative for congestion and sinus pressure.   Eyes: Negative for pain and visual disturbance.  Respiratory: Negative for cough, chest tightness and shortness of breath.   Cardiovascular: Negative for chest pain, palpitations and leg swelling.  Gastrointestinal: Negative for  abdominal pain, diarrhea, nausea and vomiting.  Genitourinary: Negative for difficulty urinating and dysuria.  Musculoskeletal: Negative for joint swelling and myalgias.       Right knee pain as outlined.    Skin: Negative for color change and rash.  Neurological: Negative for dizziness, light-headedness and headaches.  Hematological: Negative for adenopathy. Does not bruise/bleed easily.  Psychiatric/Behavioral: Negative for agitation and dysphoric mood.       Objective:    Physical Exam Constitutional:      General: She is not in acute distress.    Appearance:  Normal appearance.  HENT:     Head: Normocephalic and atraumatic.     Right Ear: External ear normal.     Left Ear: External ear normal.  Eyes:     General: No scleral icterus.       Right eye: No discharge.        Left eye: No discharge.     Conjunctiva/sclera: Conjunctivae normal.  Neck:     Thyroid: No thyromegaly.  Cardiovascular:     Rate and Rhythm: Normal rate and regular rhythm.  Pulmonary:     Effort: No respiratory distress.     Breath sounds: Normal breath sounds. No wheezing.     Comments: Breasts:  No nipple discharge or nipple retraction present.  Increased tenderness 3:00 left breast.  No distinct nodule or axillary adenopathy present - bilateral breasts.   Abdominal:     General: Bowel sounds are normal.     Palpations: Abdomen is soft.     Tenderness: There is no abdominal tenderness.  Genitourinary:    Comments: Not performed.  Musculoskeletal:        General: No swelling or tenderness.     Cervical back: Neck supple. No tenderness.  Lymphadenopathy:     Cervical: No cervical adenopathy.  Skin:    Findings: No erythema or rash.  Neurological:     Mental Status: She is alert and oriented to person, place, and time.  Psychiatric:        Mood and Affect: Mood normal.        Behavior: Behavior normal.     BP 124/64   Pulse 73   Temp (!) 97.3 F (36.3 C)   Resp 16   Ht '5\' 1"'  (1.549 m)    Wt 172 lb 10.1 oz (78.3 kg)   SpO2 98%   BMI 32.62 kg/m  Wt Readings from Last 3 Encounters:  03/27/19 172 lb 10.1 oz (78.3 kg)  02/28/18 176 lb (79.8 kg)  01/24/18 174 lb (78.9 kg)     Lab Results  Component Value Date   WBC 6.4 03/20/2019   HGB 14.2 03/20/2019   HCT 40.6 03/20/2019   PLT 205 03/20/2019   GLUCOSE 105 (H) 03/20/2019   CHOL 182 03/20/2019   TRIG 137 03/20/2019   HDL 44 03/20/2019   LDLCALC 114 (H) 03/20/2019   ALT 54 (H) 03/20/2019   AST 42 (H) 03/20/2019   NA 143 03/20/2019   K 3.7 03/20/2019   CL 101 03/20/2019   CREATININE 1.20 (H) 03/20/2019   BUN 12 03/20/2019   CO2 26 03/20/2019   TSH 1.070 03/20/2019   HGBA1C 6.1 (H) 03/20/2019    MM 3D SCREEN BREAST BILATERAL  Result Date: 04/07/2018 CLINICAL DATA:  Screening. EXAM: DIGITAL SCREENING BILATERAL MAMMOGRAM WITH TOMO AND CAD COMPARISON:  Previous exam(s). ACR Breast Density Category b: There are scattered areas of fibroglandular density. FINDINGS: There are no findings suspicious for malignancy. Images were processed with CAD. IMPRESSION: No mammographic evidence of malignancy. A result letter of this screening mammogram will be mailed directly to the patient. RECOMMENDATION: Screening mammogram in one year. (Code:SM-B-01Y) BI-RADS CATEGORY  1: Negative. Electronically Signed   By: Dorise Bullion III M.D   On: 04/07/2018 09:19       Assessment & Plan:   Problem List Items Addressed This Visit    Angiomyolipoma of kidney    Recent ultrasound - stable.        Breast pain, left    Left breast pain -  2-3:00.  Schedule diagnostic mammogram and ultrasound as outlined.  No palpable mass on exam.       Relevant Orders   MM DIAG BREAST TOMO BILATERAL   US BREAST LTD UNI LEFT INC AXILLA   US BREAST LTD UNI RIGHT INC AXILLA   Fatty liver    Diet, exercise and weight loss.  Has seen GI.  Follow liver function tests.        Health care maintenance    Physical today 03/27/19.  PAP 01/24/18 - negative  with negative HPV.  Schedule diagnostic mammogram as outlined.  Colonoscopy 02/2017 - tubular adenoma.       Hypercholesteremia    On pravastatin.  Low cholesterol diet and exercise.  Follow lipid panel and liver function tests.        Relevant Orders   Hepatic function panel   Lipid panel   Hyperglycemia    Low carb diet and exercise.  Follow met b and a1c.       Relevant Orders   Hemoglobin A1c   Hypertension    Blood pressure under good control.  Continue same medication regimen.  Follow pressures.  Follow metabolic panel.        Relevant Orders   Basic metabolic panel   Hypothyroidism    On thyroid replacement. Follow tsh.       Pituitary macroadenoma ([place])    Previously found - elevated GH - acromegaly.  Followed by Dr Gabriel Carina.  Recently evaluated.  Stable.  Recommended f/u in 9 months.  Continue cabergoline.        Renal insufficiency    Stay hydrated.  Follow metabolic panel.         Other Visit Diagnoses    Encounter for screening mammogram for malignant neoplasm of breast    -  Primary   Relevant Orders   MM DIAG BREAST TOMO BILATERAL   US BREAST LTD UNI LEFT INC AXILLA   US BREAST LTD UNI RIGHT INC AXILLA       Einar Pheasant, MD

## 2019-03-27 NOTE — Assessment & Plan Note (Addendum)
Physical today 03/27/19.  PAP 01/24/18 - negative with negative HPV.  Schedule diagnostic mammogram as outlined.  Colonoscopy 02/2017 - tubular adenoma.

## 2019-03-29 ENCOUNTER — Ambulatory Visit
Admission: RE | Admit: 2019-03-29 | Discharge: 2019-03-29 | Disposition: A | Discharge status: Home / Self Care | Payer: 59 | Source: Ambulatory Visit | Attending: Internal Medicine | Admitting: Internal Medicine

## 2019-03-29 ENCOUNTER — Other Ambulatory Visit: Payer: Self-pay

## 2019-03-29 DIAGNOSIS — R944 Abnormal results of kidney function studies: Secondary | ICD-10-CM | POA: Insufficient documentation

## 2019-03-29 DIAGNOSIS — D3001 Benign neoplasm of right kidney: Secondary | ICD-10-CM | POA: Diagnosis not present

## 2019-03-30 ENCOUNTER — Encounter: Payer: Self-pay | Admitting: Internal Medicine

## 2019-04-02 ENCOUNTER — Encounter: Payer: Self-pay | Admitting: Internal Medicine

## 2019-04-02 NOTE — Assessment & Plan Note (Signed)
Diet, exercise and weight loss.  Has seen GI.  Follow liver function tests.

## 2019-04-02 NOTE — Assessment & Plan Note (Signed)
Previously found - elevated GH - acromegaly.  Followed by Dr Gabriel Carina.  Recently evaluated.  Stable.  Recommended f/u in 9 months.  Continue cabergoline.

## 2019-04-02 NOTE — Assessment & Plan Note (Signed)
On pravastatin.  Low cholesterol diet and exercise.  Follow lipid panel and liver function tests.   

## 2019-04-02 NOTE — Assessment & Plan Note (Signed)
On thyroid replacement.  Follow tsh.  

## 2019-04-02 NOTE — Assessment & Plan Note (Signed)
Stay hydrated.  Follow metabolic panel.  

## 2019-04-02 NOTE — Assessment & Plan Note (Signed)
Left breast pain - 2-3:00.  Schedule diagnostic mammogram and ultrasound as outlined.  No palpable mass on exam.

## 2019-04-02 NOTE — Assessment & Plan Note (Signed)
Blood pressure under good control.  Continue same medication regimen.  Follow pressures.  Follow metabolic panel.   

## 2019-04-02 NOTE — Assessment & Plan Note (Signed)
Low carb diet and exercise.  Follow met b and a1c.  

## 2019-04-02 NOTE — Assessment & Plan Note (Signed)
Recent ultrasound - stable.

## 2019-04-05 ENCOUNTER — Ambulatory Visit
Admission: RE | Admit: 2019-04-05 | Discharge: 2019-04-05 | Disposition: A | Discharge status: Home / Self Care | Payer: 59 | Source: Ambulatory Visit | Attending: Internal Medicine | Admitting: Internal Medicine

## 2019-04-05 DIAGNOSIS — Z1231 Encounter for screening mammogram for malignant neoplasm of breast: Secondary | ICD-10-CM

## 2019-04-05 DIAGNOSIS — N644 Mastodynia: Secondary | ICD-10-CM | POA: Insufficient documentation

## 2019-04-05 DIAGNOSIS — R928 Other abnormal and inconclusive findings on diagnostic imaging of breast: Secondary | ICD-10-CM | POA: Diagnosis not present

## 2019-04-07 ENCOUNTER — Telehealth: Payer: Self-pay | Admitting: Internal Medicine

## 2019-04-07 ENCOUNTER — Encounter: Payer: Self-pay | Admitting: Internal Medicine

## 2019-04-07 NOTE — Telephone Encounter (Signed)
See result note.  

## 2019-04-07 NOTE — Telephone Encounter (Signed)
Pt said call her back after 1:30pm. She also said feel free to leave detailed message. She is returning your call. She said she feels like it is about her mammogram.

## 2019-04-25 ENCOUNTER — Encounter: Payer: Self-pay | Admitting: Internal Medicine

## 2019-05-16 ENCOUNTER — Encounter: Payer: Self-pay | Admitting: Internal Medicine

## 2019-05-23 ENCOUNTER — Other Ambulatory Visit: Payer: Self-pay | Admitting: Internal Medicine

## 2019-06-26 ENCOUNTER — Encounter: Payer: Self-pay | Admitting: Internal Medicine

## 2019-06-27 ENCOUNTER — Ambulatory Visit (INDEPENDENT_AMBULATORY_CARE_PROVIDER_SITE_OTHER): Payer: 59 | Admitting: Nurse Practitioner

## 2019-06-27 ENCOUNTER — Encounter: Payer: Self-pay | Admitting: Nurse Practitioner

## 2019-06-27 ENCOUNTER — Other Ambulatory Visit: Payer: Self-pay

## 2019-06-27 VITALS — BP 124/78 | HR 65 | Temp 98.2°F | Ht 61.0 in | Wt 172.0 lb

## 2019-06-27 DIAGNOSIS — S40862A Insect bite (nonvenomous) of left upper arm, initial encounter: Secondary | ICD-10-CM | POA: Insufficient documentation

## 2019-06-27 DIAGNOSIS — W57XXXA Bitten or stung by nonvenomous insect and other nonvenomous arthropods, initial encounter: Secondary | ICD-10-CM | POA: Diagnosis not present

## 2019-06-27 DIAGNOSIS — R21 Rash and other nonspecific skin eruption: Secondary | ICD-10-CM | POA: Insufficient documentation

## 2019-06-27 DIAGNOSIS — R519 Headache, unspecified: Secondary | ICD-10-CM | POA: Diagnosis not present

## 2019-06-27 MED ORDER — DOXYCYCLINE HYCLATE 100 MG PO TABS: 100.0000 mg | ORAL_TABLET | Freq: Two times a day (BID) | ORAL | 0 refills | Status: DC

## 2019-06-27 NOTE — Telephone Encounter (Signed)
I can work  Her in tomorrow if agreeable.

## 2019-06-27 NOTE — Patient Instructions (Addendum)
To the lab.  Please begin Doxy tonight.    Tick Bite Information, Adult  Ticks are insects that draw blood for food. Most ticks live in shrubs and grassy areas. They climb onto people and animals that brush against the leaves and grasses that they rest on. Then they bite, attaching themselves to the skin. Most ticks are harmless, but some ticks carry germs that can spread to a person through a bite and cause a disease. To reduce your risk of getting a disease from a tick bite, it is important to take steps to prevent tick bites. It is also important to check for ticks after being outdoors. If you find that a tick has attached to you, watch for symptoms of disease. How can I prevent tick bites? Take these steps to help prevent tick bites when you are outdoors in an area where ticks are found:  Use insect repellent that has DEET (20% or higher), picaridin, or IR3535 in it. Use it on: ? Skin that is showing. ? The top of your boots. ? Your pant legs. ? Your sleeve cuffs.  For repellent products that contain permethrin, follow product instructions. Use these products on: ? Clothing. ? Gear. ? Boots. ? Tents.  Wear protective clothing. Long sleeves and long pants offer the best protection from ticks.  Wear light-colored clothing so you can see ticks more easily.  Tuck your pant legs into your socks.  If you go walking on a trail, stay in the middle of the trail so your skin, hair, and clothing do not touch the bushes.  Avoid walking through areas with long grass.  Check for ticks on your clothing, hair, and skin often while you are outside, and check again before you go inside. Make sure to check the places that ticks attach themselves most often. These places include the scalp, neck, armpits, waist, groin, and joint areas. Ticks that carry a disease called Lyme disease have to be attached to the skin for 24-48 hours. Checking for ticks every day will lessen your risk of this and other  diseases.  When you come indoors, wash your clothes and take a shower or a bath right away. Dry your clothes in a dryer on high heat for at least 60 minutes. This will kill any ticks in your clothes. What is the proper way to remove a tick? If you find a tick on your body, remove it as soon as possible. Removing a tick sooner rather than later can prevent germs from passing from the tick to your body. To remove a tick that is crawling on your skin but has not bitten:  Go outdoors and brush the tick off.  Remove the tick with tape or a lint roller. To remove a tick that is attached to your skin:  Wash your hands.  If you have latex gloves, put them on.  Use tweezers, curved forceps, or a tick-removal tool to gently grasp the tick as close to your skin and the tick's head as possible.  Gently pull with steady, upward pressure until the tick lets go. When removing the tick: ? Take care to keep the tick's head attached to its body. ? Do not twist or jerk the tick. This can make the tick's head or mouth break off. ? Do not squeeze or crush the tick's body. This could force disease-carrying fluids from the tick into your body. Do not try to remove a tick with heat, alcohol, petroleum jelly, or fingernail polish. Using  these methods can cause the tick to salivate and regurgitate into your bloodstream, increasing your risk of getting a disease. What should I do after removing a tick?  Clean the bite area with soap and water, rubbing alcohol, or an iodine scrub.  If an antiseptic cream or ointment is available, apply a small amount to the bite site.  Wash and disinfect any instruments that you used to remove the tick. How should I dispose of a tick? To dispose of a live tick, use one of these methods:  Place it in rubbing alcohol.  Place it in a sealed bag or container.  Wrap it tightly in tape.  Flush it down the toilet. Contact a health care provider if:  You have symptoms of a  disease after a tick bite. Symptoms of a tick-borne disease can occur from moments after the tick bites to up to 30 days after a tick is removed. Symptoms include: ? Muscle, joint, or bone pain. ? Difficulty walking or moving your legs. ? Numbness in the legs. ? Paralysis. ? Red rash around the tick bite area that is shaped like a target or a "bull's-eye." ? Redness and swelling in the area of the tick bite. ? Fever. ? Repeated vomiting. ? Diarrhea. ? Weight loss. ? Tender, swollen lymph glands. ? Shortness of breath. ? Cough. ? Pain in the abdomen. ? Headache. ? Abnormal tiredness. ? A change in your level of consciousness. ? Confusion. Get help right away if:  You are not able to remove a tick.  A part of a tick breaks off and gets stuck in your skin.  Your symptoms get worse. Summary  Ticks may carry germs that can spread to a person through a bite and cause disease.  Wear protective clothing and use insect repellent to prevent tick bites. Follow product instructions.  If you find a tick on your body, remove it as soon as possible. If the tick is attached, do not try to remove with heat, alcohol, petroleum jelly, or fingernail polish.  Remove the attached tick using tweezers, curved forceps, or a tick-removal tool. Gently pull with steady, upward pressure until the tick lets go. Do not twist or jerk the tick. Do not squeeze or crush the tick's body.  If you have symptoms after being bitten by a tick, contact a health care provider. This information is not intended to replace advice given to you by your health care provider. Make sure you discuss any questions you have with your health care provider. Document Revised: 12/25/2016 Document Reviewed: 10/25/2015 Elsevier Patient Education  2020 [employer].

## 2019-06-27 NOTE — Progress Notes (Signed)
Established Patient Office Visit  Subjective:  Patient ID: Marie Colon, female    DOB: 1955/02/06  Age: 64 y.o. MRN: [id number]  CC:  Chief Complaint  Patient presents with  . Acute Visit    tick bite    HPI Marie Colon presents for a tick bite- on 06/15/2019- attached to her left AC arm space and her husband removed it with tweezers- whole tick came out intact. Very tiny tick. It was not engorged. She saw a raised red mark at the bite site since the beginning.  She noticed a rash around the bite last night. Arm site stings and burns some. She also noticed a red, pinpoint , non pruritic, non- raised rash on lower legs bilateral. No fever/chills/myalgia/fatigue. Positive mild HA across her forehead over the past 2 days. Not severe. She attributed it to allergies. No nasal congestion, no sore throat. She does not normally get HA.   Past Medical History:  Diagnosis Date  . Acromegaly ([place])   . Angiomyolipoma of kidney    evaluated by Dr Bernardo Heater  . Fatty liver   . GERD (gastroesophageal reflux disease)   . Heart murmur    per Dr. Einar Pheasant  . History of pyelonephritis   . History of ulcerative colitis   . Hypercholesterolemia   . Hypertension   . Hypothyroidism   . Pituitary macroadenoma (HCC)    s/p removal (elevated growth hormone)  . Tubal pregnancy    s/p rupture  . Wears glasses     Past Surgical History:  Procedure Laterality Date  . ABDOMINAL HYSTERECTOMY  2003   abnormal uterine bleeding  . APPENDECTOMY    . CHOLECYSTECTOMY  2005  . COLONOSCOPY WITH PROPOFOL N/A 03/18/2017   Procedure: COLONOSCOPY WITH PROPOFOL;  Surgeon: Lollie Sails, MD;  [facility] ENDOSCOPY;  Service: Endoscopy;  Laterality: N/A;  . DILATION AND CURETTAGE OF UTERUS    . MASS EXCISION Right 08/29/2015   Procedure: EXCISION OF RIGHT THIGH MASS SUBFASICAL 15 CM;  Surgeon: Stark Klein, MD;  Location: [place];  Service: General;  Laterality: Right;  . pituitary tumor removal   2006    Family History  Problem Relation Age of Onset  . Brain cancer Father        died - 34  . Diabetes Mother   . Hypothyroidism Sister   . Breast cancer Sister 37  . Sjogren's syndrome Sister   . Breast cancer Sister 64  . Diabetes Other        grandmother    Social History   Socioeconomic History  . Marital status: Married    Spouse name: Not on file  . Number of children: 2  . Years of education: Not on file  . Highest education level: Not on file  Occupational History    Employer: [employer]    Comment: [employer] - [employer]  Tobacco Use  . Smoking status: Never Smoker  . Smokeless tobacco: Never Used  Substance and Sexual Activity  . Alcohol use: No    Alcohol/week: 0.0 standard drinks  . Drug use: No  . Sexual activity: Not on file  Other Topics Concern  . Not on file  Social History Narrative  . Not on file   Social Determinants of Health   Financial Resource Strain:   . Difficulty of Paying Living Expenses:   Food Insecurity:   . Worried About Charity fundraiser in the Last Year:   . Arboriculturist in  the Last Year:   Transportation Needs:   . Film/video editor (Medical):   Marland Kitchen Lack of Transportation (Non-Medical):   Physical Activity:   . Days of Exercise per Week:   . Minutes of Exercise per Session:   Stress:   . Feeling of Stress :   Social Connections:   . Frequency of Communication with Friends and Family:   . Frequency of Social Gatherings with Friends and Family:   . Attends Religious Services:   . Active Member of Clubs or Organizations:   . Attends Archivist Meetings:   Marland Kitchen Marital Status:   Intimate Partner Violence:   . Fear of Current or Ex-Partner:   . Emotionally Abused:   Marland Kitchen Physically Abused:   . Sexually Abused:     Outpatient Medications Prior to Visit  Medication Sig Dispense Refill  . allopurinol (ZYLOPRIM) 100 MG tablet TAKE 1 TABLET BY MOUTH DAILY 90 tablet 3  . ALPRAZolam (XANAX) 0.25 MG tablet Take 1 tablet  (0.25 mg total) by mouth 2 (two) times daily as needed for anxiety. (Patient taking differently: Take 0.25 mg by mouth as needed for anxiety. ) 20 tablet 0  . aspirin EC 81 MG tablet Take 81 mg by mouth daily.    . cabergoline (DOSTINEX) 0.5 MG tablet Take 1 tablet by mouth at bedtime.    . colchicine 0.6 MG tablet Take 1 tablet (0.6 mg total) by mouth 2 (two) times daily. (Patient taking differently: Take 0.6 mg by mouth as needed. ) 60 tablet 0  . lansoprazole (PREVACID) 30 MG capsule TAKE 1 CAPSULE BY MOUTH DAILY AT 12 NOON. 90 capsule 0  . levothyroxine (SYNTHROID) 100 MCG tablet TAKE 1 TABLET BY MOUTH DAILY BEFORE BREAKFAST. 60 tablet 2  . metoprolol tartrate (LOPRESSOR) 50 MG tablet TAKE ONE TABLET BY MOUTH 2 TIMES A DAY 180 tablet 1  . Multiple Vitamin (MULTIVITAMIN) tablet Take 1 tablet by mouth daily.    . ondansetron (ZOFRAN ODT) 8 MG disintegrating tablet Take 1 tablet (8 mg total) by mouth every 8 (eight) hours as needed for nausea or vomiting. 20 tablet 0  . polyethylene glycol powder (GLYCOLAX/MIRALAX) powder Take with your choice of fluid. This is safe to use on a daily basis if needed. 3350 g 1  . pravastatin (PRAVACHOL) 40 MG tablet TAKE 1 TABLET BY MOUTH DAILY 90 tablet 1  . triamterene-hydrochlorothiazide (DYAZIDE) 37.5-25 MG capsule TAKE 1 CAPSULE BY MOUTH DAILY. 90 capsule 1  . benzonatate (TESSALON) 100 MG capsule Take 1 capsule (100 mg total) by mouth 3 (three) times daily as needed for cough. 21 capsule 0   No facility-administered medications prior to visit.    Allergies  Allergen Reactions  . Protonix [Pantoprazole] Other (See Comments)    Headaches   . Penicillins Rash    Has patient had a PCN reaction causing immediate rash, facial/tongue/throat swelling, SOB or lightheadedness with hypotension: Yes Has patient had a PCN reaction causing severe rash involving mucus membranes or skin necrosis: Yes Has patient had a PCN reaction that required hospitalization No Has  patient had a PCN reaction occurring within the last 10 years: Yes If all of the above answers are "NO", then may proceed with Cephalosporin use.     Review of Systems Pertinent positives as noted in history of present illness otherwise negative.   Objective:    Physical Exam  Constitutional: She is oriented to person, place, and time. She appears well-developed and well-nourished.  HENT:  Head: Normocephalic.  Cardiovascular: Normal rate, regular rhythm and normal heart sounds.  Pulmonary/Chest: Effort normal and breath sounds normal.  Musculoskeletal:     Cervical back: Normal range of motion and neck supple.  Neurological: She is alert and oriented to person, place, and time.  Skin:  Left arm AC space with tick bite site red/rased with slight rash as noted in photo.   Bilat anterior lower legs with tiny, pinpoint macular non-raised rash-cold be petechiae. Violeta report she always has some tiny red spots as her baseline. See photo.   Psychiatric: She has a normal mood and affect. Her behavior is normal. Judgment and thought content normal.  Vitals reviewed.     BP 124/78 (BP Location: Left Arm, Patient Position: Sitting, Cuff Size: Normal)   Pulse 65   Temp 98.2 F (36.8 C) (Skin)   Ht 5\' 1"  (1.549 m)   Wt 172 lb (78 kg)   SpO2 98%   BMI 32.50 kg/m  Wt Readings from Last 3 Encounters:  06/27/19 172 lb (78 kg)  03/27/19 172 lb 10.1 oz (78.3 kg)  02/28/18 176 lb (79.8 kg)        Assessment & Plan:   Problem List Items Addressed This Visit      Musculoskeletal and Integument   Tick bite of upper arm, left, initial encounter - Primary   Relevant Orders   B. burgdorfi antibodies   Ehrlichia antibody panel   Lyme Ab/Western Blot Reflex   Rocky mtn spotted fvr abs pnl(IgG+IgM)   Rash     Other   Nonintractable headache   Relevant Orders   B. burgdorfi antibodies   Ehrlichia antibody panel   Lyme Ab/Western Blot Reflex   Rocky mtn spotted fvr abs  pnl(IgG+IgM)      Meds ordered this encounter  Medications  . doxycycline (VIBRA-TABS) 100 MG tablet    Sig: Take 1 tablet (100 mg total) by mouth 2 (two) times daily.    Dispense:  14 tablet    Refill:  0    Order Specific Question:   Supervising Provider    Answer:   Einar Pheasant [id number]   Patient with a headache, possible petechial type rash on her lower extremities, and irritated tick bite site that looks more allergic versus the typical bull's-eye type pattern.  Will check labs for RMSF, Ehrlichia , Lyme.  These labs will need to be checked in 6 weeks as well.  Begin treatment with doxycycline x 7 day course.  Patient advised for signs and symptoms to monitor and alert.  She was given information on her AVS.  Follow-up: Return if symptoms worsen or fail to improve.  This visit occurred during the SARS-CoV-2 public health emergency.  Safety protocols were in place, including screening questions prior to the visit, additional usage of staff PPE, and extensive cleaning of exam room while observing appropriate contact time as indicated for disinfecting solutions.    Denice Paradise, NP

## 2019-06-27 NOTE — Telephone Encounter (Signed)
Pt called wanting a return call about her tick bite

## 2019-06-27 NOTE — Telephone Encounter (Signed)
Pt seeing Kim this PM. Did not want to wait until tomorrow.

## 2019-06-27 NOTE — Telephone Encounter (Signed)
Pt wanted to know if you have seen her message and will you give her a call.

## 2019-06-28 ENCOUNTER — Encounter: Payer: Self-pay | Admitting: Nurse Practitioner

## 2019-06-28 DIAGNOSIS — W57XXXA Bitten or stung by nonvenomous insect and other nonvenomous arthropods, initial encounter: Secondary | ICD-10-CM | POA: Diagnosis not present

## 2019-06-28 DIAGNOSIS — S40862A Insect bite (nonvenomous) of left upper arm, initial encounter: Secondary | ICD-10-CM | POA: Diagnosis not present

## 2019-06-28 DIAGNOSIS — R519 Headache, unspecified: Secondary | ICD-10-CM | POA: Diagnosis not present

## 2019-06-29 ENCOUNTER — Encounter: Payer: Self-pay | Admitting: Internal Medicine

## 2019-07-01 LAB — ROCKY MTN SPOTTED FVR ABS PNL(IGG+IGM)
RMSF IgG: NEGATIVE
RMSF IgM: 0.3 index (ref 0.00–0.89)

## 2019-07-01 LAB — LYME AB/WESTERN BLOT REFLEX
LYME DISEASE AB, QUANT, IGM: <0.8 index (ref 0.00–0.79)
Lyme IgG/IgM Ab: <0.91 {ISR} (ref 0.00–0.90)

## 2019-07-01 LAB — EHRLICHIA ANTIBODY PANEL
E. Chaffeensis (HME) IgM Titer: NEGATIVE
E.Chaffeensis (HME) IgG: NEGATIVE
HGE IgG Titer: NEGATIVE
HGE IgM Titer: NEGATIVE

## 2019-07-03 ENCOUNTER — Other Ambulatory Visit: Payer: Self-pay | Admitting: Internal Medicine

## 2019-07-17 DIAGNOSIS — H524 Presbyopia: Secondary | ICD-10-CM | POA: Diagnosis not present

## 2019-08-03 ENCOUNTER — Encounter: Payer: Self-pay | Admitting: Internal Medicine

## 2019-08-03 NOTE — Telephone Encounter (Signed)
Called patient over advice request. Marie Colon states that she is experiencing pain in left hip and lower back x1day. Some cramping and tight sensations. There is pain when sitting and she can't put pressure on her lower back. Nuha states she has had very loose stools on Tuesday but not since.  No other issues when using the restroom and no other signs of distress or discomfort when walking. Scheduled a video visit for Tomorrow at 11am with Denice Paradise.

## 2019-08-04 ENCOUNTER — Encounter: Payer: Self-pay | Admitting: Nurse Practitioner

## 2019-08-04 ENCOUNTER — Telehealth (INDEPENDENT_AMBULATORY_CARE_PROVIDER_SITE_OTHER): Payer: 59 | Admitting: Nurse Practitioner

## 2019-08-04 VITALS — Ht 61.0 in | Wt 172.0 lb

## 2019-08-04 DIAGNOSIS — N289 Disorder of kidney and ureter, unspecified: Secondary | ICD-10-CM

## 2019-08-04 DIAGNOSIS — R1032 Left lower quadrant pain: Secondary | ICD-10-CM | POA: Diagnosis not present

## 2019-08-04 NOTE — Patient Instructions (Addendum)
Please go to a [facility] facility and get labs done.   Change diet to low fiber- and no nuts/seeds. More full liquid choices. Scrambled eggs, soup, pudding, Ensure/Boost for 1 day to give the colon a rest. Hydrate well.   Monitor for worsening symptoms that is worsening left lower quadrant abdominal pain, or fevers or chills, or new onset back pain, or nausea or vomiting.  If your symptoms worsen please seek care at one of the walk-in clinics like [facility] or [facility] care for in-person evaluation.

## 2019-08-05 LAB — URINALYSIS, ROUTINE W REFLEX MICROSCOPIC
Bilirubin, UA: NEGATIVE
Glucose, UA: NEGATIVE
Ketones, UA: NEGATIVE
Leukocytes,UA: NEGATIVE
Nitrite, UA: NEGATIVE
Protein,UA: NEGATIVE
RBC, UA: NEGATIVE
Specific Gravity, UA: 1.014 (ref 1.005–1.030)
Urobilinogen, Ur: 0.2 mg/dL (ref 0.2–1.0)
pH, UA: 6.5 (ref 5.0–7.5)

## 2019-08-05 LAB — COMPREHENSIVE METABOLIC PANEL
ALT: 55 IU/L — ABNORMAL HIGH (ref 0–32)
AST: 38 IU/L (ref 0–40)
Albumin/Globulin Ratio: 1.9 (ref 1.2–2.2)
Albumin: 4.6 g/dL (ref 3.8–4.8)
Alkaline Phosphatase: 92 IU/L (ref 48–121)
BUN/Creatinine Ratio: 12 (ref 12–28)
BUN: 12 mg/dL (ref 8–27)
Bilirubin Total: 0.3 mg/dL (ref 0.0–1.2)
CO2: 26 mmol/L (ref 20–29)
Calcium: 9.7 mg/dL (ref 8.7–10.3)
Chloride: 100 mmol/L (ref 96–106)
Creatinine, Ser: 1.03 mg/dL — ABNORMAL HIGH (ref 0.57–1.00)
GFR calc Af Amer: 66 mL/min/{1.73_m2} (ref >59)
GFR calc non Af Amer: 58 mL/min/{1.73_m2} — ABNORMAL LOW (ref >59)
Globulin, Total: 2.4 g/dL (ref 1.5–4.5)
Glucose: 84 mg/dL (ref 65–99)
Potassium: 4 mmol/L (ref 3.5–5.2)
Sodium: 141 mmol/L (ref 134–144)
Total Protein: 7 g/dL (ref 6.0–8.5)

## 2019-08-05 LAB — CBC WITH DIFFERENTIAL/PLATELET
Basophils Absolute: 0.1 10*3/uL (ref 0.0–0.2)
Basos: 1 %
EOS (ABSOLUTE): 0.2 10*3/uL (ref 0.0–0.4)
Eos: 2 %
Hematocrit: 41 % (ref 34.0–46.6)
Hemoglobin: 14.1 g/dL (ref 11.1–15.9)
Immature Grans (Abs): 0 10*3/uL (ref 0.0–0.1)
Immature Granulocytes: 0 %
Lymphocytes Absolute: 2 10*3/uL (ref 0.7–3.1)
Lymphs: 23 %
MCH: 30.2 pg (ref 26.6–33.0)
MCHC: 34.4 g/dL (ref 31.5–35.7)
MCV: 88 fL (ref 79–97)
Monocytes Absolute: 0.8 10*3/uL (ref 0.1–0.9)
Monocytes: 10 %
Neutrophils Absolute: 5.5 10*3/uL (ref 1.4–7.0)
Neutrophils: 64 %
Platelets: 208 10*3/uL (ref 150–450)
RBC: 4.67 x10E6/uL (ref 3.77–5.28)
RDW: 13.1 % (ref 11.7–15.4)
WBC: 8.6 10*3/uL (ref 3.4–10.8)

## 2019-08-06 ENCOUNTER — Encounter: Payer: Self-pay | Admitting: Nurse Practitioner

## 2019-08-06 LAB — URINE CULTURE: Organism ID, Bacteria: NO GROWTH

## 2019-08-06 NOTE — Progress Notes (Addendum)
Virtual Visit via Video Note  This visit type was conducted due to national recommendations for restrictions regarding the COVID-19 pandemic (e.g. social distancing).  This format is felt to be most appropriate for this patient at this time.  All issues noted in this document were discussed and addressed.  No physical exam was performed (except for noted visual exam findings with Video Visits).   I connected with@ on 08/04/19 at 11:00 AM EDT by a video enabled telemedicine application or telephone and verified that I am speaking with the correct person using two identifiers. Location patient: car Location provider: work  Persons participating in the virtual visit: patient, provider  I discussed the limitations, risks, security and privacy concerns of performing an evaluation and management service by telephone and the availability of in person appointments. I also discussed with the patient that there may be a patient responsible charge related to this service. The patient expressed understanding and agreed to proceed.  Reason for visit: Cramping and loose stools- not diarrhea  HPI: This 64 year old patient reports onset Wednesday morning discomfort in her left waist area.  Her stools were small balls, and loose, but no diarrhea.  She had no nausea or vomiting is eating normally.  No fevers or chills.  No dysuria hematuria or back pain.  She has been having formed bowel movement once or twice a day and does not believe she is constipated.  She has not had portable stools.  She has noted no blood or melena stool.  Since Wednesday, this discomfort in the left side has improved in it is barely present today.  She ate sausage biscuit gravy for breakfast today and tolerated that well.  She was seen 06/27/2019 for tick, rash, and did have a 7-day course of doxycycline.  She took probiotics with doxycycline and had no diarrhea.  ROS: See pertinent positives and negatives per HPI.  Past Medical History:   Diagnosis Date  . Acromegaly ([place])   . Angiomyolipoma of kidney    evaluated by Dr Bernardo Heater  . Fatty liver   . GERD (gastroesophageal reflux disease)   . Heart murmur    per Dr. Einar Pheasant  . History of pyelonephritis   . History of ulcerative colitis   . Hypercholesterolemia   . Hypertension   . Hypothyroidism   . Pituitary macroadenoma (HCC)    s/p removal (elevated growth hormone)  . Tubal pregnancy    s/p rupture  . Wears glasses     Past Surgical History:  Procedure Laterality Date  . ABDOMINAL HYSTERECTOMY  2003   abnormal uterine bleeding  . APPENDECTOMY    . CHOLECYSTECTOMY  2005  . COLONOSCOPY WITH PROPOFOL N/A 03/18/2017   Procedure: COLONOSCOPY WITH PROPOFOL;  Surgeon: Lollie Sails, MD;  Location: [facility] ENDOSCOPY;  Service: Endoscopy;  Laterality: N/A;  . DILATION AND CURETTAGE OF UTERUS    . MASS EXCISION Right 08/29/2015   Procedure: EXCISION OF RIGHT THIGH MASS SUBFASICAL 15 CM;  Surgeon: Stark Klein, MD;  Location: [place];  Service: General;  Laterality: Right;  . pituitary tumor removal  2006    Family History  Problem Relation Age of Onset  . Brain cancer Father        died - 49  . Diabetes Mother   . Hypothyroidism Sister   . Breast cancer Sister 34  . Sjogren's syndrome Sister   . Breast cancer Sister 1  . Diabetes Other        grandmother  SOCIAL HX: Never smoked.   Current Outpatient Medications:  .  allopurinol (ZYLOPRIM) 100 MG tablet, TAKE 1 TABLET BY MOUTH DAILY, Disp: 90 tablet, Rfl: 3 .  ALPRAZolam (XANAX) 0.25 MG tablet, Take 1 tablet (0.25 mg total) by mouth 2 (two) times daily as needed for anxiety. (Patient taking differently: Take 0.25 mg by mouth as needed for anxiety. ), Disp: 20 tablet, Rfl: 0 .  aspirin EC 81 MG tablet, Take 81 mg by mouth daily., Disp: , Rfl:  .  cabergoline (DOSTINEX) 0.5 MG tablet, Take 1 tablet by mouth at bedtime., Disp: , Rfl:  .  lansoprazole (PREVACID) 30 MG capsule, TAKE 1 CAPSULE BY MOUTH  DAILY AT 12 NOON., Disp: 90 capsule, Rfl: 0 .  levothyroxine (SYNTHROID) 100 MCG tablet, TAKE 1 TABLET BY MOUTH DAILY BEFORE BREAKFAST., Disp: 90 tablet, Rfl: 2 .  metoprolol tartrate (LOPRESSOR) 50 MG tablet, TAKE ONE TABLET BY MOUTH 2 TIMES A DAY, Disp: 180 tablet, Rfl: 1 .  Multiple Vitamin (MULTIVITAMIN) tablet, Take 1 tablet by mouth daily., Disp: , Rfl:  .  ondansetron (ZOFRAN ODT) 8 MG disintegrating tablet, Take 1 tablet (8 mg total) by mouth every 8 (eight) hours as needed for nausea or vomiting., Disp: 20 tablet, Rfl: 0 .  polyethylene glycol powder (GLYCOLAX/MIRALAX) powder, Take with your choice of fluid. This is safe to use on a daily basis if needed., Disp: 3350 g, Rfl: 1 .  pravastatin (PRAVACHOL) 40 MG tablet, TAKE 1 TABLET BY MOUTH DAILY, Disp: 90 tablet, Rfl: 1 .  triamterene-hydrochlorothiazide (DYAZIDE) 37.5-25 MG capsule, TAKE 1 CAPSULE BY MOUTH DAILY., Disp: 90 capsule, Rfl: 1  EXAM:  VITALS per patient if applicable:  GENERAL: alert, oriented, appears well and in no acute distress  HEENT: atraumatic, conjunctiva clear, no obvious abnormalities on inspection of external nose and ears  NECK: normal movements of the head and neck  LUNGS: on inspection no signs of respiratory distress, breathing rate appears normal, no obvious gross SOB, gasping or wheezing  CV: no obvious cyanosis  MS: moves all visible extremities without noticeable abnormality  PSYCH/NEURO: pleasant and cooperative, no obvious depression or anxiety, speech and thought processing grossly intact  ASSESSMENT AND PLAN:  Discussed the following assessment and plan:  LLQ abdominal pain - Plan: CBC with Differential/Platelet, Comp Met (CMET), Urinalysis, Routine w reflex microscopic, Urine Culture  Renal insufficiency  No problem-specific Assessment & Plan notes found for this encounter.    Change diet to low fiber- and no nuts/seeds. More full liquid choices. Scrambled eggs, soup, pudding,  Ensure/Boost for 1 day to give the colon a rest. Hydrate well.   Monitor for worsening symptoms that is worsening left lower quadrant abdominal pain, or fevers or chills, or new onset back pain, or nausea or vomiting.  If your symptoms worsen please seek care at one of the walk-in clinics like [facility] or [facility]gent care for in-person evaluation. I discussed the assessment and treatment plan with the patient. The patient was provided an opportunity to ask questions and all were answered. The patient agreed with the plan and demonstrated an understanding of the instructions.   The patient was advised to call back or seek an in-person evaluation if the symptoms worsen or if the condition fails to improve as anticipated.  I provided 14 minutes of non-face-to-face time during this encounter. Denice Paradise, NP Adult Nurse Practitioner [place] [phone]

## 2019-08-08 ENCOUNTER — Ambulatory Visit: Payer: 59 | Admitting: Internal Medicine

## 2019-08-21 ENCOUNTER — Telehealth: Payer: Self-pay | Admitting: Internal Medicine

## 2019-08-21 DIAGNOSIS — R739 Hyperglycemia, unspecified: Secondary | ICD-10-CM

## 2019-08-21 DIAGNOSIS — E7800 Pure hypercholesterolemia, unspecified: Secondary | ICD-10-CM

## 2019-08-21 DIAGNOSIS — I10 Essential (primary) hypertension: Secondary | ICD-10-CM

## 2019-08-21 NOTE — Telephone Encounter (Signed)
Pt needs lab orders put in for [facility] not [facility]

## 2019-08-21 NOTE — Telephone Encounter (Signed)
Labs ordered for [facility]. Left detailed message for patient.

## 2019-08-21 NOTE — Addendum Note (Signed)
Addended by: Lars Masson on: 08/21/2019 04:19 PM   Modules accepted: Orders

## 2019-08-22 DIAGNOSIS — I10 Essential (primary) hypertension: Secondary | ICD-10-CM | POA: Diagnosis not present

## 2019-08-22 DIAGNOSIS — R739 Hyperglycemia, unspecified: Secondary | ICD-10-CM | POA: Diagnosis not present

## 2019-08-22 DIAGNOSIS — E7800 Pure hypercholesterolemia, unspecified: Secondary | ICD-10-CM | POA: Diagnosis not present

## 2019-08-23 LAB — LIPID PANEL
Chol/HDL Ratio: 5.2 ratio — ABNORMAL HIGH (ref 0.0–4.4)
Cholesterol, Total: 197 mg/dL (ref 100–199)
HDL: 38 mg/dL — ABNORMAL LOW (ref >39)
LDL Chol Calc (NIH): 117 mg/dL — ABNORMAL HIGH (ref 0–99)
Triglycerides: 241 mg/dL — ABNORMAL HIGH (ref 0–149)
VLDL Cholesterol Cal: 42 mg/dL — ABNORMAL HIGH (ref 5–40)

## 2019-08-23 LAB — BASIC METABOLIC PANEL
BUN/Creatinine Ratio: 15 (ref 12–28)
BUN: 17 mg/dL (ref 8–27)
CO2: 24 mmol/L (ref 20–29)
Calcium: 9.5 mg/dL (ref 8.7–10.3)
Chloride: 102 mmol/L (ref 96–106)
Creatinine, Ser: 1.11 mg/dL — ABNORMAL HIGH (ref 0.57–1.00)
GFR calc Af Amer: 61 mL/min/{1.73_m2} (ref >59)
GFR calc non Af Amer: 53 mL/min/{1.73_m2} — ABNORMAL LOW (ref >59)
Glucose: 94 mg/dL (ref 65–99)
Potassium: 4.2 mmol/L (ref 3.5–5.2)
Sodium: 143 mmol/L (ref 134–144)

## 2019-08-23 LAB — TSH: TSH: 1.06 u[IU]/mL (ref 0.450–4.500)

## 2019-08-23 LAB — HEMOGLOBIN A1C
Est. average glucose Bld gHb Est-mCnc: 126 mg/dL
Hgb A1c MFr Bld: 6 % — ABNORMAL HIGH (ref 4.8–5.6)

## 2019-08-23 LAB — HEPATIC FUNCTION PANEL
ALT: 47 IU/L — ABNORMAL HIGH (ref 0–32)
AST: 35 IU/L (ref 0–40)
Albumin: 4.4 g/dL (ref 3.8–4.8)
Alkaline Phosphatase: 93 IU/L (ref 48–121)
Bilirubin Total: <0.2 mg/dL (ref 0.0–1.2)
Bilirubin, Direct: 0.1 mg/dL (ref 0.00–0.40)
Total Protein: 6.4 g/dL (ref 6.0–8.5)

## 2019-08-29 ENCOUNTER — Other Ambulatory Visit: Payer: Self-pay | Admitting: Internal Medicine

## 2019-08-30 ENCOUNTER — Encounter: Payer: Self-pay | Admitting: Internal Medicine

## 2019-08-30 ENCOUNTER — Other Ambulatory Visit: Payer: Self-pay

## 2019-08-30 ENCOUNTER — Ambulatory Visit: Payer: 59 | Admitting: Internal Medicine

## 2019-08-30 ENCOUNTER — Telehealth: Payer: Self-pay | Admitting: Internal Medicine

## 2019-08-30 ENCOUNTER — Other Ambulatory Visit: Payer: Self-pay | Admitting: Internal Medicine

## 2019-08-30 DIAGNOSIS — R739 Hyperglycemia, unspecified: Secondary | ICD-10-CM

## 2019-08-30 DIAGNOSIS — K760 Fatty (change of) liver, not elsewhere classified: Secondary | ICD-10-CM | POA: Diagnosis not present

## 2019-08-30 DIAGNOSIS — D352 Benign neoplasm of pituitary gland: Secondary | ICD-10-CM

## 2019-08-30 DIAGNOSIS — E039 Hypothyroidism, unspecified: Secondary | ICD-10-CM

## 2019-08-30 DIAGNOSIS — E220 Acromegaly and pituitary gigantism: Secondary | ICD-10-CM | POA: Diagnosis not present

## 2019-08-30 DIAGNOSIS — I10 Essential (primary) hypertension: Secondary | ICD-10-CM | POA: Diagnosis not present

## 2019-08-30 DIAGNOSIS — E7800 Pure hypercholesterolemia, unspecified: Secondary | ICD-10-CM | POA: Diagnosis not present

## 2019-08-30 DIAGNOSIS — Z8371 Family history of colonic polyps: Secondary | ICD-10-CM | POA: Diagnosis not present

## 2019-08-30 MED ORDER — ALLOPURINOL 100 MG PO TABS: 100.0000 mg | ORAL_TABLET | Freq: Every day | ORAL | 3 refills | Status: DC

## 2019-08-30 MED ORDER — PRAVASTATIN SODIUM 40 MG PO TABS: 40.0000 mg | ORAL_TABLET | Freq: Every day | ORAL | 1 refills | Status: DC

## 2019-08-30 NOTE — Telephone Encounter (Signed)
Labs placed for [facility]

## 2019-08-30 NOTE — Progress Notes (Signed)
Patient ID: Marie Colon, female   DOB: 02-Apr-1955, 64 y.o.   MRN: [id number]   Subjective:    Patient ID: Marie Colon, female    DOB: 1955/10/26, 64 y.o.   MRN: [id number]  HPI This visit occurred during the SARS-CoV-2 public health emergency.  Safety protocols were in place, including screening questions prior to the visit, additional usage of staff PPE, and extensive cleaning of exam room while observing appropriate contact time as indicated for disinfecting solutions.  Patient here for a scheduled follow up.  Evaluated by Dawson Bills 08/04/19 for left side pain.  Note reviewed.  Labs reviewed.  No further pain.  Eating well.  Bowels moving.  No nausea or vomiting.  No chest pain or sob reported.  No increased cough or congestion.  Discussed labs.  Discussed elevated triglycerides.  Discussed diet and exercise.   Overall doing relatively well.    Past Medical History:  Diagnosis Date  . Acromegaly ([place])   . Actinic keratosis   . Angiomyolipoma of kidney    evaluated by Dr Bernardo Heater  . Fatty liver   . GERD (gastroesophageal reflux disease)   . Heart murmur    per Dr. Einar Pheasant  . History of pyelonephritis   . History of ulcerative colitis   . Hx of dysplastic nevus 04/21/2016   L upper back, moderate to severe atypia  . Hypercholesterolemia   . Hypertension   . Hypothyroidism   . Pituitary macroadenoma ([facility])    s/p removal (elevated growth hormone)  . Tubal pregnancy    s/p rupture  . Wears glasses    Past Surgical History:  Procedure Laterality Date  . ABDOMINAL HYSTERECTOMY  2003   abnormal uterine bleeding  . APPENDECTOMY    . CHOLECYSTECTOMY  2005  . COLONOSCOPY WITH PROPOFOL N/A 03/18/2017   Procedure: COLONOSCOPY WITH PROPOFOL;  Surgeon: Lollie Sails, MD;  Location: [facility] ENDOSCOPY;  Service: Endoscopy;  Laterality: N/A;  . DILATION AND CURETTAGE OF UTERUS    . MASS EXCISION Right 08/29/2015   Procedure: EXCISION OF RIGHT THIGH MASS SUBFASICAL 15 CM;   Surgeon: Stark Klein, MD;  Location: [facility];  Service: General;  Laterality: Right;  . pituitary tumor removal  2006   Family History  Problem Relation Age of Onset  . Brain cancer Father        died - 11  . Diabetes Mother   . Hypothyroidism Sister   . Breast cancer Sister 13  . Sjogren's syndrome Sister   . Breast cancer Sister 7  . Diabetes Other        grandmother   Social History   Socioeconomic History  . Marital status: Married    Spouse name: Not on file  . Number of children: 2  . Years of education: Not on file  . Highest education level: Not on file  Occupational History    Employer: [employer]    Comment: [employer] - [employer]  Tobacco Use  . Smoking status: Never Smoker  . Smokeless tobacco: Never Used  Vaping Use  . Vaping Use: Never used  Substance and Sexual Activity  . Alcohol use: No    Alcohol/week: 0.0 standard drinks  . Drug use: No  . Sexual activity: Not on file  Other Topics Concern  . Not on file  Social History Narrative  . Not on file   Social Determinants of Health   Financial Resource Strain:   . Difficulty of Paying Living Expenses: Not on file  Food Insecurity:   . Worried About Charity fundraiser in the Last Year: Not on file  . Ran Out of Food in the Last Year: Not on file  Transportation Needs:   . Lack of Transportation (Medical): Not on file  . Lack of Transportation (Non-Medical): Not on file  Physical Activity:   . Days of Exercise per Week: Not on file  . Minutes of Exercise per Session: Not on file  Stress:   . Feeling of Stress : Not on file  Social Connections:   . Frequency of Communication with Friends and Family: Not on file  . Frequency of Social Gatherings with Friends and Family: Not on file  . Attends Religious Services: Not on file  . Active Member of Clubs or Organizations: Not on file  . Attends Archivist Meetings: Not on file  . Marital Status: Not on file    Outpatient Encounter Medications as of  08/30/2019  Medication Sig  . allopurinol (ZYLOPRIM) 100 MG tablet Take 1 tablet (100 mg total) by mouth daily.  Marland Kitchen aspirin EC 81 MG tablet Take 81 mg by mouth daily.  . cabergoline (DOSTINEX) 0.5 MG tablet Take 1 tablet by mouth at bedtime.  . lansoprazole (PREVACID) 30 MG capsule TAKE 1 CAPSULE BY MOUTH DAILY AT 12 NOON.  Marland Kitchen levothyroxine (SYNTHROID) 100 MCG tablet TAKE 1 TABLET BY MOUTH DAILY BEFORE BREAKFAST.  . metoprolol tartrate (LOPRESSOR) 50 MG tablet TAKE ONE TABLET BY MOUTH 2 TIMES A DAY  . Multiple Vitamin (MULTIVITAMIN) tablet Take 1 tablet by mouth daily.  . polyethylene glycol powder (GLYCOLAX/MIRALAX) powder Take with your choice of fluid. This is safe to use on a daily basis if needed.  . pravastatin (PRAVACHOL) 40 MG tablet Take 1 tablet (40 mg total) by mouth daily.  Marland Kitchen triamterene-hydrochlorothiazide (DYAZIDE) 37.5-25 MG capsule TAKE 1 CAPSULE BY MOUTH DAILY.  . [DISCONTINUED] allopurinol (ZYLOPRIM) 100 MG tablet TAKE 1 TABLET BY MOUTH DAILY  . [DISCONTINUED] pravastatin (PRAVACHOL) 40 MG tablet TAKE 1 TABLET BY MOUTH DAILY  . [DISCONTINUED] ALPRAZolam (XANAX) 0.25 MG tablet Take 1 tablet (0.25 mg total) by mouth 2 (two) times daily as needed for anxiety. (Patient taking differently: Take 0.25 mg by mouth as needed for anxiety. )  . [DISCONTINUED] ondansetron (ZOFRAN ODT) 8 MG disintegrating tablet Take 1 tablet (8 mg total) by mouth every 8 (eight) hours as needed for nausea or vomiting.   No facility-administered encounter medications on file as of 08/30/2019.    Review of Systems  Constitutional: Negative for appetite change and unexpected weight change.  HENT: Negative for congestion and sinus pressure.   Respiratory: Negative for cough, chest tightness and shortness of breath.   Cardiovascular: Negative for chest pain, palpitations and leg swelling.  Gastrointestinal: Negative for abdominal pain, diarrhea, nausea and vomiting.  Genitourinary: Negative for difficulty  urinating and dysuria.  Musculoskeletal: Negative for joint swelling and myalgias.  Skin: Negative for color change and rash.  Neurological: Negative for dizziness, light-headedness and headaches.  Psychiatric/Behavioral: Negative for agitation and dysphoric mood.       Objective:    Physical Exam Constitutional:      General: She is not in acute distress.    Appearance: Normal appearance.  HENT:     Head: Normocephalic and atraumatic.     Right Ear: External ear normal.     Left Ear: External ear normal.  Eyes:     General: No scleral icterus.  Right eye: No discharge.        Left eye: No discharge.     Conjunctiva/sclera: Conjunctivae normal.  Neck:     Thyroid: No thyromegaly.  Cardiovascular:     Rate and Rhythm: Normal rate and regular rhythm.  Pulmonary:     Effort: No respiratory distress.     Breath sounds: Normal breath sounds. No wheezing.  Abdominal:     General: Bowel sounds are normal.     Palpations: Abdomen is soft.     Tenderness: There is no abdominal tenderness.  Musculoskeletal:        General: No swelling or tenderness.     Cervical back: Neck supple. No tenderness.  Lymphadenopathy:     Cervical: No cervical adenopathy.  Skin:    Findings: No erythema or rash.  Neurological:     Mental Status: She is alert.  Psychiatric:        Mood and Affect: Mood normal.        Behavior: Behavior normal.     BP 108/70   Pulse 79   Temp 98.3 F (36.8 C)   Resp 16   Ht '5\' 1"'  (1.549 m)   Wt 172 lb 3.2 oz (78.1 kg)   SpO2 99%   BMI 32.54 kg/m  Wt Readings from Last 3 Encounters:  08/30/19 172 lb 3.2 oz (78.1 kg)  08/04/19 172 lb (78 kg)  06/27/19 172 lb (78 kg)     Lab Results  Component Value Date   WBC 8.6 08/04/2019   HGB 14.1 08/04/2019   HCT 41.0 08/04/2019   PLT 208 08/04/2019   GLUCOSE 94 08/22/2019   CHOL 197 08/22/2019   TRIG 241 (H) 08/22/2019   HDL 38 (L) 08/22/2019   LDLCALC 117 (H) 08/22/2019   ALT 47 (H) 08/22/2019    AST 35 08/22/2019   NA 143 08/22/2019   K 4.2 08/22/2019   CL 102 08/22/2019   CREATININE 1.11 (H) 08/22/2019   BUN 17 08/22/2019   CO2 24 08/22/2019   TSH 1.060 08/22/2019   HGBA1C 6.0 (H) 08/22/2019    US BREAST LTD UNI LEFT INC AXILLA  Result Date: 04/05/2019 CLINICAL DATA:  64 year old female with focal pain in the UPPER OUTER LEFT breast. Also for annual bilateral mammogram. EXAM: DIGITAL DIAGNOSTIC BILATERAL MAMMOGRAM WITH CAD AND TOMO ULTRASOUND LEFT BREAST COMPARISON:  Previous exam(s). ACR Breast Density Category b: There are scattered areas of fibroglandular density. FINDINGS: 2D/3D full field views of both breasts and a spot compression view of the LEFT breast demonstrate no suspicious mass, distortion or worrisome calcifications. Mammographic images were processed with CAD. Targeted ultrasound is performed, showing no solid or cystic mass, distortion or abnormal shadowing within the UPPER-OUTER LEFT breast, at the site of patient's pain. IMPRESSION: 1. No mammographic or sonographic abnormality within the UPPER-OUTER LEFT breast, at the site of patient's pain. 2. No mammographic evidence of breast malignancy. RECOMMENDATION: Bilateral screening mammogram in 1 year. Consider clinical follow-up as indicated. Any further workup should be based on clinical grounds. I have discussed the findings, causes of breast pain and recommendations with the patient. If applicable, a reminder letter will be sent to the patient regarding the next appointment. BI-RADS CATEGORY  1: Negative. Electronically Signed   By: Margarette Canada M.D.   On: 04/05/2019 10:05   MM DIAG BREAST TOMO BILATERAL  Result Date: 04/05/2019 CLINICAL DATA:  64 year old female with focal pain in the UPPER OUTER LEFT breast. Also for annual bilateral mammogram.  EXAM: DIGITAL DIAGNOSTIC BILATERAL MAMMOGRAM WITH CAD AND TOMO ULTRASOUND LEFT BREAST COMPARISON:  Previous exam(s). ACR Breast Density Category b: There are scattered areas of  fibroglandular density. FINDINGS: 2D/3D full field views of both breasts and a spot compression view of the LEFT breast demonstrate no suspicious mass, distortion or worrisome calcifications. Mammographic images were processed with CAD. Targeted ultrasound is performed, showing no solid or cystic mass, distortion or abnormal shadowing within the UPPER-OUTER LEFT breast, at the site of patient's pain. IMPRESSION: 1. No mammographic or sonographic abnormality within the UPPER-OUTER LEFT breast, at the site of patient's pain. 2. No mammographic evidence of breast malignancy. RECOMMENDATION: Bilateral screening mammogram in 1 year. Consider clinical follow-up as indicated. Any further workup should be based on clinical grounds. I have discussed the findings, causes of breast pain and recommendations with the patient. If applicable, a reminder letter will be sent to the patient regarding the next appointment. BI-RADS CATEGORY  1: Negative. Electronically Signed   By: Margarette Canada M.D.   On: 04/05/2019 10:05       Assessment & Plan:   Problem List Items Addressed This Visit    Pituitary macroadenoma ([place])    Previously found - elevated GH - acromegaly.  Followed by Dr Gabriel Carina.  Stable.  Continues on cabergoline.        Hypothyroidism    On thyroid replacement.  Follow tsh.        Hypertension    Blood pressure doing well.  Continue on metoprolol and triam/hctz.  Follow pressures.  Follow metabolic panel.       Relevant Medications   pravastatin (PRAVACHOL) 40 MG tablet   Other Relevant Orders   Basic metabolic panel   Hyperglycemia    Low carb diet and exercise.  Follow met b and a1c.       Relevant Orders   Hemoglobin A1c   Hypercholesteremia    On pravastatin.  Low cholesterol diet and exercise.  Follow lipid panel and liver function tests.        Relevant Medications   pravastatin (PRAVACHOL) 40 MG tablet   Other Relevant Orders   Hepatic function panel   Lipid panel   Fatty liver     Has seen GI.  Continue diet and exercise.  Follow liver function tests.        Family history of colonic polyps    Colonoscopy 08/2017 - one polyp ascending colon.        Acromegaly ([place])    Followed by endocrinology.  Continue cabergoline.            Einar Pheasant, MD

## 2019-08-30 NOTE — Telephone Encounter (Signed)
Remind Dr.Scott to put in labs for pt and they are lab [facility] she is getting her labs at [facility]

## 2019-08-30 NOTE — Telephone Encounter (Signed)
Just a reminder that her labs will be [facility] labs.

## 2019-09-03 DIAGNOSIS — Z20822 Contact with and (suspected) exposure to covid-19: Secondary | ICD-10-CM | POA: Diagnosis not present

## 2019-09-03 DIAGNOSIS — Z20828 Contact with and (suspected) exposure to other viral communicable diseases: Secondary | ICD-10-CM | POA: Diagnosis not present

## 2019-09-03 DIAGNOSIS — Z03818 Encounter for observation for suspected exposure to other biological agents ruled out: Secondary | ICD-10-CM | POA: Diagnosis not present

## 2019-09-06 ENCOUNTER — Encounter: Payer: Self-pay | Admitting: Dermatology

## 2019-09-06 ENCOUNTER — Ambulatory Visit: Payer: 59 | Admitting: Dermatology

## 2019-09-06 ENCOUNTER — Other Ambulatory Visit: Payer: Self-pay

## 2019-09-06 DIAGNOSIS — Z1283 Encounter for screening for malignant neoplasm of skin: Secondary | ICD-10-CM

## 2019-09-06 DIAGNOSIS — L814 Other melanin hyperpigmentation: Secondary | ICD-10-CM

## 2019-09-06 DIAGNOSIS — D229 Melanocytic nevi, unspecified: Secondary | ICD-10-CM | POA: Diagnosis not present

## 2019-09-06 DIAGNOSIS — L570 Actinic keratosis: Secondary | ICD-10-CM | POA: Diagnosis not present

## 2019-09-06 DIAGNOSIS — L821 Other seborrheic keratosis: Secondary | ICD-10-CM | POA: Diagnosis not present

## 2019-09-06 DIAGNOSIS — L719 Rosacea, unspecified: Secondary | ICD-10-CM

## 2019-09-06 DIAGNOSIS — L578 Other skin changes due to chronic exposure to nonionizing radiation: Secondary | ICD-10-CM | POA: Diagnosis not present

## 2019-09-06 DIAGNOSIS — Z86018 Personal history of other benign neoplasm: Secondary | ICD-10-CM | POA: Diagnosis not present

## 2019-09-06 DIAGNOSIS — D1800 Hemangioma unspecified site: Secondary | ICD-10-CM

## 2019-09-06 MED ORDER — METRONIDAZOLE 0.75 % EX LOTN: 1.0000 "application " | TOPICAL_LOTION | Freq: Every day | CUTANEOUS | 11 refills | Status: DC

## 2019-09-06 NOTE — Patient Instructions (Signed)
Cryotherapy Aftercare  . Wash gently with soap and water everyday.   . Apply Vaseline and Band-Aid daily until healed.  

## 2019-09-06 NOTE — Progress Notes (Signed)
   Follow-Up Visit   Subjective  Marie Colon is a 64 y.o. female who presents for the following: Annual Exam (Total body skin exam, Hx Dysplastic L upper back, hx of AKs). The patient presents for Total-Body Skin Exam (TBSE) for skin cancer screening and mole check.  The following portions of the chart were reviewed this encounter and updated as appropriate:  Tobacco  Allergies  Meds  Problems  Med Hx  Surg Hx  Fam Hx     Review of Systems:  No other skin or systemic complaints except as noted in HPI or Assessment and Plan.  Objective  Well appearing patient in no apparent distress; mood and affect are within normal limits.  A full examination was performed including scalp, head, eyes, ears, nose, lips, neck, chest, axillae, abdomen, back, buttocks, bilateral upper extremities, bilateral lower extremities, hands, feet, fingers, toes, fingernails, and toenails. All findings within normal limits unless otherwise noted below.  Objective  Left Upper Back: Scar with no evidence of recurrence.   Objective  forehead x 1, scalp x 5 (6): Pink scaly macules   Objective  face: Mid face erythema with telangiectasias +/- scattered inflammatory papules.    Assessment & Plan   Lentigines - Scattered tan macules - Discussed due to sun exposure - Benign, observe - Call for any changes  Seborrheic Keratoses - Stuck-on, waxy, tan-brown papules and plaques  - Discussed benign etiology and prognosis. - Observe - Call for any changes  Melanocytic Nevi - Tan-brown and/or pink-flesh-colored symmetric macules and papules - Benign appearing on exam today - Observation - Call clinic for new or changing moles - Recommend daily use of broad spectrum spf 30+ sunscreen to sun-exposed areas.   Hemangiomas - Red papules - Discussed benign nature - Observe - Call for any changes  Actinic Damage - diffuse scaly erythematous macules with underlying dyspigmentation - Recommend  daily broad spectrum sunscreen SPF 30+ to sun-exposed areas, reapply every 2 hours as needed.  - Call for new or changing lesions.  Skin cancer screening performed today.   History of dysplastic nevus Left Upper Back  Clear, observe for changes   AK (actinic keratosis) (6) forehead x 1, scalp x 5  Pt to RTC if treated areas not resolved in 2 months  Destruction of lesion - forehead x 1, scalp x 5 Complexity: simple   Destruction method: cryotherapy   Informed consent: discussed and consent obtained   Timeout:  patient name, date of birth, surgical site, and procedure verified Lesion destroyed using liquid nitrogen: Yes   Region frozen until ice ball extended beyond lesion: Yes   Outcome: patient tolerated procedure well with no complications   Post-procedure details: wound care instructions given    Rosacea face  Start Metrolotion 0.75% qhs to face  METRONIDAZOLE, TOPICAL, 0.75 % LOTN - face  Return in about 1 year (around 09/05/2020) for TBSE, hx of Dysplastic, AKs.  I, Sonya Hupman, RMA, am acting as scribe for Sarina Ser, MD .  Documentation: I have reviewed the above documentation for accuracy and completeness, and I agree with the above.  Sarina Ser, MD

## 2019-09-12 ENCOUNTER — Encounter: Payer: Self-pay | Admitting: Dermatology

## 2019-09-16 ENCOUNTER — Encounter: Payer: Self-pay | Admitting: Internal Medicine

## 2019-09-16 DIAGNOSIS — E220 Acromegaly and pituitary gigantism: Secondary | ICD-10-CM | POA: Insufficient documentation

## 2019-09-16 NOTE — Assessment & Plan Note (Signed)
On thyroid replacement.  Follow tsh.  

## 2019-09-16 NOTE — Assessment & Plan Note (Signed)
Colonoscopy 08/2017 - one polyp ascending colon.   

## 2019-09-16 NOTE — Assessment & Plan Note (Signed)
Previously found - elevated GH - acromegaly.  Followed by Dr Gabriel Carina.  Stable.  Continues on cabergoline.

## 2019-09-16 NOTE — Assessment & Plan Note (Signed)
On pravastatin.  Low cholesterol diet and exercise.  Follow lipid panel and liver function tests.   

## 2019-09-16 NOTE — Assessment & Plan Note (Signed)
Low carb diet and exercise.  Follow met b and a1c.  

## 2019-09-16 NOTE — Assessment & Plan Note (Signed)
Blood pressure doing well.  Continue on metoprolol and triam/hctz.  Follow pressures.  Follow metabolic panel.

## 2019-09-16 NOTE — Assessment & Plan Note (Signed)
Followed by endocrinology.  Continue cabergoline.

## 2019-09-16 NOTE — Assessment & Plan Note (Signed)
Has seen GI.  Continue diet and exercise.  Follow liver function tests.

## 2019-10-03 DIAGNOSIS — E039 Hypothyroidism, unspecified: Secondary | ICD-10-CM | POA: Diagnosis not present

## 2019-10-03 DIAGNOSIS — E220 Acromegaly and pituitary gigantism: Secondary | ICD-10-CM | POA: Diagnosis not present

## 2019-11-01 DIAGNOSIS — Z87898 Personal history of other specified conditions: Secondary | ICD-10-CM | POA: Diagnosis not present

## 2019-11-01 DIAGNOSIS — E220 Acromegaly and pituitary gigantism: Secondary | ICD-10-CM | POA: Diagnosis not present

## 2019-11-01 DIAGNOSIS — R7303 Prediabetes: Secondary | ICD-10-CM | POA: Diagnosis not present

## 2019-11-01 DIAGNOSIS — E039 Hypothyroidism, unspecified: Secondary | ICD-10-CM | POA: Diagnosis not present

## 2019-11-14 ENCOUNTER — Other Ambulatory Visit: Payer: Self-pay | Admitting: Internal Medicine

## 2019-12-18 DIAGNOSIS — E7800 Pure hypercholesterolemia, unspecified: Secondary | ICD-10-CM | POA: Diagnosis not present

## 2019-12-18 DIAGNOSIS — R739 Hyperglycemia, unspecified: Secondary | ICD-10-CM | POA: Diagnosis not present

## 2019-12-18 DIAGNOSIS — I10 Essential (primary) hypertension: Secondary | ICD-10-CM | POA: Diagnosis not present

## 2019-12-19 LAB — HEPATIC FUNCTION PANEL
ALT: 50 IU/L — ABNORMAL HIGH (ref 0–32)
AST: 39 IU/L (ref 0–40)
Albumin: 4.4 g/dL (ref 3.8–4.8)
Alkaline Phosphatase: 82 IU/L (ref 44–121)
Bilirubin Total: 0.5 mg/dL (ref 0.0–1.2)
Bilirubin, Direct: 0.14 mg/dL (ref 0.00–0.40)
Total Protein: 6.7 g/dL (ref 6.0–8.5)

## 2019-12-19 LAB — LIPID PANEL
Chol/HDL Ratio: 4.7 ratio — ABNORMAL HIGH (ref 0.0–4.4)
Cholesterol, Total: 199 mg/dL (ref 100–199)
HDL: 42 mg/dL (ref >39)
LDL Chol Calc (NIH): 125 mg/dL — ABNORMAL HIGH (ref 0–99)
Triglycerides: 179 mg/dL — ABNORMAL HIGH (ref 0–149)
VLDL Cholesterol Cal: 32 mg/dL (ref 5–40)

## 2019-12-19 LAB — BASIC METABOLIC PANEL
BUN/Creatinine Ratio: 11 — ABNORMAL LOW (ref 12–28)
BUN: 13 mg/dL (ref 8–27)
CO2: 27 mmol/L (ref 20–29)
Calcium: 9.4 mg/dL (ref 8.7–10.3)
Chloride: 99 mmol/L (ref 96–106)
Creatinine, Ser: 1.16 mg/dL — ABNORMAL HIGH (ref 0.57–1.00)
GFR calc Af Amer: 57 mL/min/{1.73_m2} — ABNORMAL LOW (ref >59)
GFR calc non Af Amer: 50 mL/min/{1.73_m2} — ABNORMAL LOW (ref >59)
Glucose: 92 mg/dL (ref 65–99)
Potassium: 3.6 mmol/L (ref 3.5–5.2)
Sodium: 140 mmol/L (ref 134–144)

## 2019-12-19 LAB — HEMOGLOBIN A1C
Est. average glucose Bld gHb Est-mCnc: 123 mg/dL
Hgb A1c MFr Bld: 5.9 % — ABNORMAL HIGH (ref 4.8–5.6)

## 2020-01-01 ENCOUNTER — Ambulatory Visit: Payer: 59 | Admitting: Internal Medicine

## 2020-01-01 ENCOUNTER — Encounter: Payer: Self-pay | Admitting: Internal Medicine

## 2020-01-01 ENCOUNTER — Other Ambulatory Visit: Payer: Self-pay

## 2020-01-01 DIAGNOSIS — E7800 Pure hypercholesterolemia, unspecified: Secondary | ICD-10-CM

## 2020-01-01 DIAGNOSIS — E220 Acromegaly and pituitary gigantism: Secondary | ICD-10-CM | POA: Diagnosis not present

## 2020-01-01 DIAGNOSIS — D352 Benign neoplasm of pituitary gland: Secondary | ICD-10-CM

## 2020-01-01 DIAGNOSIS — R739 Hyperglycemia, unspecified: Secondary | ICD-10-CM

## 2020-01-01 DIAGNOSIS — K760 Fatty (change of) liver, not elsewhere classified: Secondary | ICD-10-CM | POA: Diagnosis not present

## 2020-01-01 DIAGNOSIS — I10 Essential (primary) hypertension: Secondary | ICD-10-CM | POA: Diagnosis not present

## 2020-01-01 DIAGNOSIS — E039 Hypothyroidism, unspecified: Secondary | ICD-10-CM

## 2020-01-01 NOTE — Progress Notes (Signed)
Patient ID: Marie Colon, female   DOB: [birth year], 64 y.o.   MRN: [id number]   Subjective:    Patient ID: Marie Colon, female    DOB: 11-12-1955, 64 y.o.   MRN: [id number]  HPI This visit occurred during the SARS-CoV-2 public health emergency.  Safety protocols were in place, including screening questions prior to the visit, additional usage of staff PPE, and extensive cleaning of exam room while observing appropriate contact time as indicated for disinfecting solutions.  Patient here for a scheduled follow up.  Here to follow up regarding her blood pressure and cholesterol.  She reports she is doing relatively well.  Increased stress.  Discussed.  Not getting to see her grandchildren due to covid.  Has good support.  Overall she feels she is handling things relatively well.  No chest pain or sob reported.  No abdominal pain or bowel change reported.  Just recently saw Dr Gabriel Carina.  Stable.  Has f/u planned in the Spring.    Past Medical History:  Diagnosis Date  . Acromegaly ([place])   . Actinic keratosis   . Angiomyolipoma of kidney    evaluated by Dr Bernardo Heater  . Fatty liver   . GERD (gastroesophageal reflux disease)   . Heart murmur    per Dr. Einar Pheasant  . History of pyelonephritis   . History of ulcerative colitis   . Hx of dysplastic nevus 04/21/2016   L upper back, moderate to severe atypia  . Hypercholesterolemia   . Hypertension   . Hypothyroidism   . Pituitary macroadenoma ([facility])    s/p removal (elevated growth hormone)  . Tubal pregnancy    s/p rupture  . Wears glasses    Past Surgical History:  Procedure Laterality Date  . ABDOMINAL HYSTERECTOMY  2003   abnormal uterine bleeding  . APPENDECTOMY    . CHOLECYSTECTOMY  2005  . COLONOSCOPY WITH PROPOFOL N/A 03/18/2017   Procedure: COLONOSCOPY WITH PROPOFOL;  Surgeon: Lollie Sails, MD;  Location: [facility] ENDOSCOPY;  Service: Endoscopy;  Laterality: N/A;  . DILATION AND CURETTAGE OF UTERUS    . MASS EXCISION  Right 08/29/2015   Procedure: EXCISION OF RIGHT THIGH MASS SUBFASICAL 15 CM;  Surgeon: Stark Klein, MD;  Location: [place];  Service: General;  Laterality: Right;  . pituitary tumor removal  2006   Family History  Problem Relation Age of Onset  . Brain cancer Father        died - 53  . Diabetes Mother   . Hypothyroidism Sister   . Breast cancer Sister 74  . Sjogren's syndrome Sister   . Breast cancer Sister 80  . Diabetes Other        grandmother   Social History   Socioeconomic History  . Marital status: Married    Spouse name: Not on file  . Number of children: 2  . Years of education: Not on file  . Highest education level: Not on file  Occupational History    Employer: [employer]    Comment: [employer] - [employer]  Tobacco Use  . Smoking status: Never Smoker  . Smokeless tobacco: Never Used  Vaping Use  . Vaping Use: Never used  Substance and Sexual Activity  . Alcohol use: No    Alcohol/week: 0.0 standard drinks  . Drug use: No  . Sexual activity: Not on file  Other Topics Concern  . Not on file  Social History Narrative  . Not on file   Social Determinants of Health  Financial Resource Strain: Not on file  Food Insecurity: Not on file  Transportation Needs: Not on file  Physical Activity: Not on file  Stress: Not on file  Social Connections: Not on file    Outpatient Encounter Medications as of 01/01/2020  Medication Sig  . allopurinol (ZYLOPRIM) 100 MG tablet Take 1 tablet (100 mg total) by mouth daily.  Marland Kitchen aspirin EC 81 MG tablet Take 81 mg by mouth daily.  . cabergoline (DOSTINEX) 0.5 MG tablet Take 1 tablet by mouth at bedtime.  . lansoprazole (PREVACID) 30 MG capsule TAKE 1 CAPSULE BY MOUTH DAILY AT 12 NOON.  Marland Kitchen levothyroxine (SYNTHROID) 100 MCG tablet TAKE 1 TABLET BY MOUTH DAILY BEFORE BREAKFAST.  . metoprolol tartrate (LOPRESSOR) 50 MG tablet TAKE ONE TABLET BY MOUTH 2 TIMES A DAY  . METRONIDAZOLE, TOPICAL, 0.75 % LOTN Apply 1 application topically at bedtime.  qhs to face for Rosacea  . Multiple Vitamin (MULTIVITAMIN) tablet Take 1 tablet by mouth daily.  . polyethylene glycol powder (GLYCOLAX/MIRALAX) powder Take with your choice of fluid. This is safe to use on a daily basis if needed.  . pravastatin (PRAVACHOL) 40 MG tablet Take 1 tablet (40 mg total) by mouth daily.  Marland Kitchen triamterene-hydrochlorothiazide (DYAZIDE) 37.5-25 MG capsule TAKE 1 CAPSULE BY MOUTH DAILY.   No facility-administered encounter medications on file as of 01/01/2020.    Review of Systems  Constitutional: Negative for appetite change and unexpected weight change.  HENT: Negative for congestion and sinus pressure.   Respiratory: Negative for cough, chest tightness and shortness of breath.   Cardiovascular: Negative for chest pain, palpitations and leg swelling.  Gastrointestinal: Negative for abdominal pain, diarrhea, nausea and vomiting.  Genitourinary: Negative for difficulty urinating and dysuria.  Musculoskeletal: Negative for joint swelling and myalgias.  Neurological: Negative for dizziness, light-headedness and headaches.  Psychiatric/Behavioral: Negative for agitation and dysphoric mood.       Objective:    Physical Exam Vitals reviewed.  Constitutional:      General: She is not in acute distress.    Appearance: Normal appearance.  HENT:     Head: Normocephalic and atraumatic.     Right Ear: External ear normal.     Left Ear: External ear normal.     Mouth/Throat:     Mouth: Oropharynx is clear and moist.  Eyes:     General: No scleral icterus.       Right eye: No discharge.        Left eye: No discharge.     Conjunctiva/sclera: Conjunctivae normal.  Neck:     Thyroid: No thyromegaly.  Cardiovascular:     Rate and Rhythm: Normal rate and regular rhythm.  Pulmonary:     Effort: No respiratory distress.     Breath sounds: Normal breath sounds. No wheezing.  Abdominal:     General: Bowel sounds are normal.     Palpations: Abdomen is soft.      Tenderness: There is no abdominal tenderness.  Musculoskeletal:        General: No swelling, tenderness or edema.     Cervical back: Neck supple. No tenderness.  Lymphadenopathy:     Cervical: No cervical adenopathy.  Skin:    Findings: No erythema or rash.  Neurological:     Mental Status: She is alert.  Psychiatric:        Mood and Affect: Mood normal.        Behavior: Behavior normal.     BP 120/76 (BP Location: Left  Arm, Patient Position: Sitting)   Pulse 76   Temp 98.4 F (36.9 C)   Ht 5' 0.98" (1.549 m)   Wt 170 lb 12.8 oz (77.5 kg)   SpO2 99%   BMI 32.29 kg/m  Wt Readings from Last 3 Encounters:  01/01/20 170 lb 12.8 oz (77.5 kg)  08/30/19 172 lb 3.2 oz (78.1 kg)  08/04/19 172 lb (78 kg)     Lab Results  Component Value Date   WBC 8.6 08/04/2019   HGB 14.1 08/04/2019   HCT 41.0 08/04/2019   PLT 208 08/04/2019   GLUCOSE 92 12/18/2019   CHOL 199 12/18/2019   TRIG 179 (H) 12/18/2019   HDL 42 12/18/2019   LDLCALC 125 (H) 12/18/2019   ALT 50 (H) 12/18/2019   AST 39 12/18/2019   NA 140 12/18/2019   K 3.6 12/18/2019   CL 99 12/18/2019   CREATININE 1.16 (H) 12/18/2019   BUN 13 12/18/2019   CO2 27 12/18/2019   TSH 1.060 08/22/2019   HGBA1C 5.9 (H) 12/18/2019    US BREAST LTD UNI LEFT INC AXILLA  Result Date: 04/05/2019 CLINICAL DATA:  64 year old female with focal pain in the UPPER OUTER LEFT breast. Also for annual bilateral mammogram. EXAM: DIGITAL DIAGNOSTIC BILATERAL MAMMOGRAM WITH CAD AND TOMO ULTRASOUND LEFT BREAST COMPARISON:  Previous exam(s). ACR Breast Density Category b: There are scattered areas of fibroglandular density. FINDINGS: 2D/3D full field views of both breasts and a spot compression view of the LEFT breast demonstrate no suspicious mass, distortion or worrisome calcifications. Mammographic images were processed with CAD. Targeted ultrasound is performed, showing no solid or cystic mass, distortion or abnormal shadowing within the  UPPER-OUTER LEFT breast, at the site of patient's pain. IMPRESSION: 1. No mammographic or sonographic abnormality within the UPPER-OUTER LEFT breast, at the site of patient's pain. 2. No mammographic evidence of breast malignancy. RECOMMENDATION: Bilateral screening mammogram in 1 year. Consider clinical follow-up as indicated. Any further workup should be based on clinical grounds. I have discussed the findings, causes of breast pain and recommendations with the patient. If applicable, a reminder letter will be sent to the patient regarding the next appointment. BI-RADS CATEGORY  1: Negative. Electronically Signed   By: Margarette Canada M.D.   On: 04/05/2019 10:05   MM DIAG BREAST TOMO BILATERAL  Result Date: 04/05/2019 CLINICAL DATA:  64 year old female with focal pain in the UPPER OUTER LEFT breast. Also for annual bilateral mammogram. EXAM: DIGITAL DIAGNOSTIC BILATERAL MAMMOGRAM WITH CAD AND TOMO ULTRASOUND LEFT BREAST COMPARISON:  Previous exam(s). ACR Breast Density Category b: There are scattered areas of fibroglandular density. FINDINGS: 2D/3D full field views of both breasts and a spot compression view of the LEFT breast demonstrate no suspicious mass, distortion or worrisome calcifications. Mammographic images were processed with CAD. Targeted ultrasound is performed, showing no solid or cystic mass, distortion or abnormal shadowing within the UPPER-OUTER LEFT breast, at the site of patient's pain. IMPRESSION: 1. No mammographic or sonographic abnormality within the UPPER-OUTER LEFT breast, at the site of patient's pain. 2. No mammographic evidence of breast malignancy. RECOMMENDATION: Bilateral screening mammogram in 1 year. Consider clinical follow-up as indicated. Any further workup should be based on clinical grounds. I have discussed the findings, causes of breast pain and recommendations with the patient. If applicable, a reminder letter will be sent to the patient regarding the next appointment.  BI-RADS CATEGORY  1: Negative. Electronically Signed   By: Margarette Canada M.D.   On: 04/05/2019 10:05  Assessment & Plan:   Problem List Items Addressed This Visit    Pituitary macroadenoma ([place])    Previously found - elevated GH - acromegaly.  Followed by Dr Gabriel Carina.  On cabergoline.  Recommended f/u ECHO next year.       Hypothyroidism    On thyroid replacement.  Follow tsh.       Relevant Orders   TSH   Hypertension    Continue metoprolol and triam/hctz.  Follow pressures.  Follow metabolic panel.       Relevant Orders   Basic metabolic panel   Hyperglycemia    Low carb diet and exercise.  Follow met b and a1c.       Relevant Orders   Hemoglobin A1c   Hypercholesteremia    On pravastatin.  Low cholesterol diet and exercise.  Follow lipid panel and liver function tests.        Relevant Orders   Hepatic function panel   Lipid panel   Fatty liver    Diet and exercise.  Has seen GI.  Follow liver function tests.        Acromegaly ([place])    Followed by endocrinology.  Continues on cabergoline.            Einar Pheasant, MD

## 2020-01-07 ENCOUNTER — Encounter: Payer: Self-pay | Admitting: Internal Medicine

## 2020-01-07 NOTE — Assessment & Plan Note (Signed)
On pravastatin.  Low cholesterol diet and exercise.  Follow lipid panel and liver function tests.   

## 2020-01-07 NOTE — Assessment & Plan Note (Signed)
Continue metoprolol and triam/hctz.  Follow pressures.  Follow metabolic panel.

## 2020-01-07 NOTE — Assessment & Plan Note (Signed)
Previously found - elevated GH - acromegaly.  Followed by Dr Gabriel Carina.  On cabergoline.  Recommended f/u ECHO next year.

## 2020-01-07 NOTE — Assessment & Plan Note (Signed)
Diet and exercise.  Has seen GI. Follow liver function tests.   

## 2020-01-07 NOTE — Assessment & Plan Note (Signed)
On thyroid replacement.  Follow tsh.  

## 2020-01-07 NOTE — Assessment & Plan Note (Signed)
Low carb diet and exercise.  Follow met b and a1c.  

## 2020-01-07 NOTE — Assessment & Plan Note (Signed)
Followed by endocrinology.  Continues on cabergoline.

## 2020-02-16 ENCOUNTER — Other Ambulatory Visit: Payer: Self-pay | Admitting: Internal Medicine

## 2020-03-19 ENCOUNTER — Other Ambulatory Visit: Payer: Self-pay | Admitting: Internal Medicine

## 2020-03-22 ENCOUNTER — Other Ambulatory Visit: Payer: Self-pay | Admitting: Internal Medicine

## 2020-04-12 ENCOUNTER — Telehealth: Payer: Self-pay | Admitting: Internal Medicine

## 2020-04-12 NOTE — Telephone Encounter (Signed)
Orders [facility].

## 2020-04-12 NOTE — Telephone Encounter (Signed)
Pt called she needs the lab orders placed to [facility] not our office

## 2020-04-12 NOTE — Addendum Note (Signed)
Addended byElpidio Galea T on: 04/12/2020 08:57 AM   Modules accepted: Orders

## 2020-04-19 DIAGNOSIS — R739 Hyperglycemia, unspecified: Secondary | ICD-10-CM | POA: Diagnosis not present

## 2020-04-19 DIAGNOSIS — I10 Essential (primary) hypertension: Secondary | ICD-10-CM | POA: Diagnosis not present

## 2020-04-19 DIAGNOSIS — E220 Acromegaly and pituitary gigantism: Secondary | ICD-10-CM | POA: Diagnosis not present

## 2020-04-19 DIAGNOSIS — E039 Hypothyroidism, unspecified: Secondary | ICD-10-CM | POA: Diagnosis not present

## 2020-04-19 DIAGNOSIS — E7800 Pure hypercholesterolemia, unspecified: Secondary | ICD-10-CM | POA: Diagnosis not present

## 2020-04-19 DIAGNOSIS — R7303 Prediabetes: Secondary | ICD-10-CM | POA: Diagnosis not present

## 2020-04-20 LAB — BASIC METABOLIC PANEL
BUN/Creatinine Ratio: 11 — ABNORMAL LOW (ref 12–28)
BUN: 12 mg/dL (ref 8–27)
CO2: 24 mmol/L (ref 20–29)
Calcium: 9.5 mg/dL (ref 8.7–10.3)
Chloride: 101 mmol/L (ref 96–106)
Creatinine, Ser: 1.07 mg/dL — ABNORMAL HIGH (ref 0.57–1.00)
Glucose: 94 mg/dL (ref 65–99)
Potassium: 3.6 mmol/L (ref 3.5–5.2)
Sodium: 145 mmol/L — ABNORMAL HIGH (ref 134–144)
eGFR: 58 mL/min/{1.73_m2} — ABNORMAL LOW (ref >59)

## 2020-04-20 LAB — HEMOGLOBIN A1C
Est. average glucose Bld gHb Est-mCnc: 131 mg/dL
Hgb A1c MFr Bld: 6.2 % — ABNORMAL HIGH (ref 4.8–5.6)

## 2020-04-23 ENCOUNTER — Encounter: Payer: Self-pay | Admitting: Internal Medicine

## 2020-04-24 LAB — HEPATIC FUNCTION PANEL
ALT: 56 IU/L — ABNORMAL HIGH (ref 0–32)
AST: 47 IU/L — ABNORMAL HIGH (ref 0–40)
Albumin: 4.4 g/dL (ref 3.8–4.8)
Alkaline Phosphatase: 82 IU/L (ref 44–121)
Bilirubin Total: <0.2 mg/dL (ref 0.0–1.2)
Bilirubin, Direct: <0.1 mg/dL (ref 0.00–0.40)
Total Protein: 6.7 g/dL (ref 6.0–8.5)

## 2020-04-24 LAB — LIPID PANEL
Chol/HDL Ratio: 5.3 ratio — ABNORMAL HIGH (ref 0.0–4.4)
Cholesterol, Total: 197 mg/dL (ref 100–199)
HDL: 37 mg/dL — ABNORMAL LOW (ref >39)
LDL Chol Calc (NIH): 123 mg/dL — ABNORMAL HIGH (ref 0–99)
Triglycerides: 209 mg/dL — ABNORMAL HIGH (ref 0–149)
VLDL Cholesterol Cal: 37 mg/dL (ref 5–40)

## 2020-04-24 LAB — TSH: TSH: 1.54 u[IU]/mL (ref 0.450–4.500)

## 2020-04-24 LAB — SPECIMEN STATUS REPORT

## 2020-04-30 ENCOUNTER — Other Ambulatory Visit: Payer: Self-pay

## 2020-04-30 ENCOUNTER — Ambulatory Visit
Admission: EM | Admit: 2020-04-30 | Discharge: 2020-04-30 | Disposition: A | Discharge status: Home / Self Care | Payer: 59 | Attending: Emergency Medicine | Admitting: Emergency Medicine

## 2020-04-30 DIAGNOSIS — R112 Nausea with vomiting, unspecified: Secondary | ICD-10-CM

## 2020-04-30 DIAGNOSIS — R197 Diarrhea, unspecified: Secondary | ICD-10-CM | POA: Diagnosis not present

## 2020-04-30 MED ORDER — ONDANSETRON 8 MG PO TBDP: 8.0000 mg | ORAL_TABLET | Freq: Three times a day (TID) | ORAL | 0 refills | Status: DC | PRN

## 2020-04-30 MED ORDER — ONDANSETRON 8 MG PO TBDP
8.0000 mg | ORAL_TABLET | Freq: Once | ORAL | Status: AC
  Administered 2020-04-30: 8 mg via ORAL

## 2020-04-30 NOTE — Discharge Instructions (Addendum)
Continue with oral rehydration using ginger ale, broth, or Pedialyte until tomorrow morning.  Use the Zofran every 8 hours as needed for nausea and vomiting.  If you are tolerating oral fluid without nausea you can start advancing your diet with bland foods such as bananas, rice, applesauce, and toast starting tomorrow morning.  If you have a return of nausea and vomiting that is not responding to the Zofran, and you are unable to keep down oral fluids to replace the fluids you are losing through your bowel, you need to go to the ER for reevaluation and IV hydration.

## 2020-04-30 NOTE — ED Provider Notes (Signed)
[facility]    CSN: [id number] Arrival date & time: 04/30/20  [place]      History   Chief Complaint Chief Complaint  Patient presents with  . Nausea  . Emesis    HPI Marie Colon is a 65 y.o. female.   HPI   65 year old female here for evaluation of nausea and vomiting.  Patient reports that her symptoms started around 6 AM this morning and she has had 4 episodes of vomiting and 8+ episodes of liquid stool.  She reports that her stools are not large volumes and she denies any blood in either her emesis or stool.  Patient states that she developed a fever this afternoon of 100 but had not had a fever to that point.  Patient denies abdominal pain but states that her belly is sore from all the vomiting and diarrhea.  Patient denies eating anything that was suspected or tasted bad.  She had a chicken sandwich for dinner last night and she reports that afterwards she had a lot of burping.  Past Medical History:  Diagnosis Date  . Acromegaly ([place])   . Actinic keratosis   . Angiomyolipoma of kidney    evaluated by Dr Bernardo Heater  . Fatty liver   . GERD (gastroesophageal reflux disease)   . Heart murmur    per Dr. Einar Pheasant  . History of pyelonephritis   . History of ulcerative colitis   . Hx of dysplastic nevus 04/21/2016   L upper back, moderate to severe atypia  . Hypercholesterolemia   . Hypertension   . Hypothyroidism   . Pituitary macroadenoma ([facility])    s/p removal (elevated growth hormone)  . Tubal pregnancy    s/p rupture  . Wears glasses     Patient Active Problem List   Diagnosis Date Noted  . Acromegaly ([place]) 09/16/2019  . LLQ abdominal pain 08/04/2019  . Tick bite of upper arm, left, initial encounter 06/27/2019  . Nonintractable headache 06/27/2019  . Rash 06/27/2019  . Breast pain, left 03/27/2019  . Hyperglycemia 01/25/2018  . Gout 05/26/2015  . Soft tissue mass 01/06/2015  . Health care maintenance 04/29/2014  . BMI 33.0-33.9,adult  12/15/2013  . Renal insufficiency 04/05/2013  . Family history of breast cancer 12/11/2012  . Gastritis 11/28/2012  . Family history of colonic polyps 11/28/2012  . Pituitary macroadenoma ([place]) 12/05/2011  . Fatty liver 12/05/2011  . Angiomyolipoma of kidney 12/05/2011  . Hypothyroidism 12/05/2011  . Hypertension 11/24/2011  . Hypercholesteremia 11/24/2011    Past Surgical History:  Procedure Laterality Date  . ABDOMINAL HYSTERECTOMY  2003   abnormal uterine bleeding  . APPENDECTOMY    . CHOLECYSTECTOMY  2005  . COLONOSCOPY WITH PROPOFOL N/A 03/18/2017   Procedure: COLONOSCOPY WITH PROPOFOL;  Surgeon: Lollie Sails, MD;  Location: [facility] ENDOSCOPY;  Service: Endoscopy;  Laterality: N/A;  . DILATION AND CURETTAGE OF UTERUS    . MASS EXCISION Right 08/29/2015   Procedure: EXCISION OF RIGHT THIGH MASS SUBFASICAL 15 CM;  Surgeon: Stark Klein, MD;  Location: [place];  Service: General;  Laterality: Right;  . pituitary tumor removal  2006    OB History   No obstetric history on file.      Home Medications    Prior to Admission medications   Medication Sig Start Date End Date Taking? Authorizing Provider  ondansetron (ZOFRAN ODT) 8 MG disintegrating tablet Take 1 tablet (8 mg total) by mouth every 8 (eight) hours as needed for nausea or  vomiting. 04/30/20  Yes Margarette Canada, NP  allopurinol (ZYLOPRIM) 100 MG tablet TAKE 1 TABLET BY MOUTH DAILY. 08/30/19 08/29/20  Einar Pheasant, MD  aspirin EC 81 MG tablet Take 81 mg by mouth daily.    [provider]  cabergoline (DOSTINEX) 0.5 MG tablet Take 1 tablet by mouth at bedtime. 04/13/16   [provider]  cabergoline (DOSTINEX) 0.5 MG tablet TAKE 1 TABLET BY MOUTH ONCE DAILY 03/22/20 03/22/21  Judi Cong, MD  lansoprazole (PREVACID) 30 MG capsule TAKE 1 CAPSULE BY MOUTH DAILY AT 12 NOON. 02/16/20 02/15/21  Einar Pheasant, MD  levothyroxine (SYNTHROID) 100 MCG tablet TAKE 1 TABLET BY MOUTH DAILY BEFORE BREAKFAST. 03/19/20  03/19/21  Einar Pheasant, MD  metoprolol tartrate (LOPRESSOR) 50 MG tablet TAKE 1 TABLET BY MOUTH TWICE DAILY 02/16/20 02/15/21  Einar Pheasant, MD  METRONIDAZOLE, TOPICAL, 0.75 % LOTN Apply 1 application topically at bedtime. qhs to face for Rosacea 09/06/19   Ralene Bathe, MD  Multiple Vitamin (MULTIVITAMIN) tablet Take 1 tablet by mouth daily.    [provider]  polyethylene glycol powder (GLYCOLAX/MIRALAX) powder Take with your choice of fluid. This is safe to use on a daily basis if needed. 02/03/12   Rey, Latina Craver, NP  pravastatin (PRAVACHOL) 40 MG tablet TAKE 1 TABLET (40 MG TOTAL) BY MOUTH DAILY. 08/30/19 08/29/20  Einar Pheasant, MD  triamterene-hydrochlorothiazide (DYAZIDE) 37.5-25 MG capsule TAKE 1 CAPSULE BY MOUTH DAILY. 02/16/20 02/15/21  Einar Pheasant, MD    Family History Family History  Problem Relation Age of Onset  . Brain cancer Father        died - 67  . Diabetes Mother   . Hypothyroidism Sister   . Breast cancer Sister 72  . Sjogren's syndrome Sister   . Breast cancer Sister 66  . Diabetes Other        grandmother    Social History Social History   Tobacco Use  . Smoking status: Never Smoker  . Smokeless tobacco: Never Used  Vaping Use  . Vaping Use: Never used  Substance Use Topics  . Alcohol use: No    Alcohol/week: 0.0 standard drinks  . Drug use: No     Allergies   Protonix [pantoprazole] and Penicillins   Review of Systems Review of Systems  Constitutional: Positive for fever. Negative for activity change and appetite change.  Gastrointestinal: Positive for abdominal pain, diarrhea, nausea and vomiting.  Skin: Negative for rash.  Neurological: Negative for syncope.  Hematological: Negative.   Psychiatric/Behavioral: Negative.      Physical Exam Triage Vital Signs ED Triage Vitals  Enc Vitals Group     BP 04/30/20 1921 99/81     Pulse Rate 04/30/20 1921 (!) 110     Resp 04/30/20 1921 17     Temp 04/30/20 1921 99.2 F (37.3  C)     Temp Source 04/30/20 1921 Oral     SpO2 04/30/20 1921 96 %     Weight 04/30/20 1918 173 lb (78.5 kg)     Height 04/30/20 1918 5\' 1"  (1.549 m)     Head Circumference --      Peak Flow --      Pain Score 04/30/20 1918 0     Pain Loc --      Pain Edu? --      Excl. in [place]? --    No data found.  Updated Vital Signs BP 99/81 (BP Location: Right Arm)   Pulse (!) 110  Temp 99.2 F (37.3 C) (Oral)   Resp 17   Ht 5\' 1"  (1.549 m)   Wt 173 lb (78.5 kg)   SpO2 96%   BMI 32.69 kg/m   Visual Acuity Right Eye Distance:   Left Eye Distance:   Bilateral Distance:    Right Eye Near:   Left Eye Near:    Bilateral Near:     Physical Exam Vitals and nursing note reviewed.  Constitutional:      General: She is not in acute distress.    Appearance: Normal appearance.  HENT:     Head: Normocephalic and atraumatic.  Cardiovascular:     Rate and Rhythm: Normal rate and regular rhythm.     Pulses: Normal pulses.     Heart sounds: Normal heart sounds. No murmur heard. No gallop.   Pulmonary:     Effort: Pulmonary effort is normal.     Breath sounds: Normal breath sounds. No wheezing, rhonchi or rales.  Abdominal:     General: Bowel sounds are normal. There is no distension.     Palpations: Abdomen is soft.     Tenderness: There is no abdominal tenderness. There is no guarding or rebound.  Skin:    General: Skin is warm and dry.     Capillary Refill: Capillary refill takes less than 2 seconds.     Findings: No erythema or rash.  Neurological:     General: No focal deficit present.     Mental Status: She is alert and oriented to person, place, and time.  Psychiatric:        Mood and Affect: Mood normal.        Behavior: Behavior normal.        Thought Content: Thought content normal.        Judgment: Judgment normal.      UC Treatments / Results  Labs (all labs ordered are listed, but only abnormal results are displayed) Labs Reviewed - No data to  display  EKG   Radiology No results found.  Procedures Procedures (including critical care time)  Medications Ordered in UC Medications  ondansetron (ZOFRAN-ODT) disintegrating tablet 8 mg (8 mg Oral Given 04/30/20 2000)    Initial Impression / Assessment and Plan / UC Course  I have reviewed the triage vital signs and the nursing notes.  Pertinent labs & imaging results that were available during my care of the patient were reviewed by me and considered in my medical decision making (see chart for details).   Patient is a pleasant 65 year old female who appears to not be feeling well and presents for evaluation of nausea, vomiting, and diarrhea that started at 6 AM this morning.  Patient's physical exam reveals slightly decreased skin turgor but her mucous membranes are moist.  Cardiopulmonary exam is unremarkable.  Abdomen is soft, nondistended, with positive bowel sounds in all 4 quadrants.  Patient has no focal tenderness but complains her abdomen is sore to palpation.  Suspect patient symptoms may be foodborne.  Will give Zofran ODT and attempt p.o. challenge.  Patient advised that if she is unable to pass a p.o. challenge that we will have to send her to the emergency department for IV hydration.  Patient tolerating p.o. challenge after Zofran administration.  We will discharge patient home with a diagnosis of nausea, vomiting, and diarrhea with a prescription for Zofran and instructions on oral rehydration therapy.  Work note provided.   Final Clinical Impressions(s) / UC Diagnoses   Final diagnoses:  Nausea vomiting and diarrhea     Discharge Instructions     Continue with oral rehydration using ginger ale, broth, or Pedialyte until tomorrow morning.  Use the Zofran every 8 hours as needed for nausea and vomiting.  If you are tolerating oral fluid without nausea you can start advancing your diet with bland foods such as bananas, rice, applesauce, and toast starting  tomorrow morning.  If you have a return of nausea and vomiting that is not responding to the Zofran, and you are unable to keep down oral fluids to replace the fluids you are losing through your bowel, you need to go to the ER for reevaluation and IV hydration.    ED Prescriptions    Medication Sig Dispense Auth. Provider   ondansetron (ZOFRAN ODT) 8 MG disintegrating tablet Take 1 tablet (8 mg total) by mouth every 8 (eight) hours as needed for nausea or vomiting. 20 tablet Margarette Canada, NP     PDMP not reviewed this encounter.   Margarette Canada, NP 04/30/20 2052

## 2020-04-30 NOTE — ED Triage Notes (Signed)
Pt started with nausea and had 4 episodes of vomiting today. Endorses multiple episodes of diarrhea. Yesterday had grilled chicken sandwich. Taking sips of ginger ale today.

## 2020-05-01 ENCOUNTER — Encounter: Payer: Self-pay | Admitting: Internal Medicine

## 2020-05-02 ENCOUNTER — Encounter: Payer: 59 | Admitting: Internal Medicine

## 2020-05-08 ENCOUNTER — Other Ambulatory Visit: Payer: Self-pay

## 2020-05-08 DIAGNOSIS — Z87898 Personal history of other specified conditions: Secondary | ICD-10-CM | POA: Diagnosis not present

## 2020-05-08 DIAGNOSIS — E220 Acromegaly and pituitary gigantism: Secondary | ICD-10-CM | POA: Diagnosis not present

## 2020-05-08 DIAGNOSIS — E039 Hypothyroidism, unspecified: Secondary | ICD-10-CM | POA: Diagnosis not present

## 2020-05-08 DIAGNOSIS — R7303 Prediabetes: Secondary | ICD-10-CM | POA: Diagnosis not present

## 2020-05-08 MED ORDER — CABERGOLINE 0.5 MG PO TABS
ORAL_TABLET | ORAL | 3 refills | Status: DC
  Filled 2020-05-08 – 2020-06-18 (×2): qty 45, 90d supply, fill #0
  Filled 2020-09-16: qty 45, 90d supply, fill #1
  Filled 2020-11-25 – 2020-12-15 (×2): qty 45, 90d supply, fill #2
  Filled 2021-01-28 (×2): qty 45, 90d supply, fill #3

## 2020-05-13 ENCOUNTER — Telehealth: Payer: Self-pay | Admitting: Internal Medicine

## 2020-05-13 DIAGNOSIS — Z1231 Encounter for screening mammogram for malignant neoplasm of breast: Secondary | ICD-10-CM

## 2020-05-13 NOTE — Telephone Encounter (Signed)
Received notice that mammogram is overdue.  I have placed order.  Please notify pt that mammogram needs to be ordered and provide information to order.

## 2020-05-15 NOTE — Telephone Encounter (Signed)
Patient has been rescheduled for cpe and given number for mammogram

## 2020-05-28 ENCOUNTER — Ambulatory Visit
Admission: RE | Admit: 2020-05-28 | Discharge: 2020-05-28 | Disposition: A | Discharge status: Home / Self Care | Payer: 59 | Source: Ambulatory Visit | Attending: Internal Medicine | Admitting: Internal Medicine

## 2020-05-28 ENCOUNTER — Other Ambulatory Visit: Payer: Self-pay

## 2020-05-28 DIAGNOSIS — Z1231 Encounter for screening mammogram for malignant neoplasm of breast: Secondary | ICD-10-CM | POA: Diagnosis not present

## 2020-05-30 ENCOUNTER — Other Ambulatory Visit: Payer: Self-pay

## 2020-05-30 ENCOUNTER — Other Ambulatory Visit: Payer: Self-pay | Admitting: Internal Medicine

## 2020-05-30 MED ORDER — PRAVASTATIN SODIUM 40 MG PO TABS
ORAL_TABLET | Freq: Every day | ORAL | 1 refills | Status: DC
  Filled 2020-05-30: qty 90, 90d supply, fill #0
  Filled 2020-09-02: qty 90, 90d supply, fill #1

## 2020-05-30 MED ORDER — LANSOPRAZOLE 30 MG PO CPDR
DELAYED_RELEASE_CAPSULE | ORAL | 0 refills | Status: DC
  Filled 2020-05-30: qty 90, 90d supply, fill #0

## 2020-05-30 MED FILL — Triamterene & Hydrochlorothiazide Cap 37.5-25 MG: ORAL | 90 days supply | Qty: 90 | Fill #0 | Status: AC

## 2020-05-30 MED FILL — Levothyroxine Sodium Tab 100 MCG: ORAL | 90 days supply | Qty: 90 | Fill #0 | Status: CN

## 2020-05-30 MED FILL — Metoprolol Tartrate Tab 50 MG: ORAL | 90 days supply | Qty: 180 | Fill #0 | Status: CN

## 2020-05-30 MED FILL — Allopurinol Tab 100 MG: ORAL | 90 days supply | Qty: 90 | Fill #0 | Status: AC

## 2020-06-03 ENCOUNTER — Other Ambulatory Visit: Payer: Self-pay

## 2020-06-03 ENCOUNTER — Ambulatory Visit: Payer: 59 | Admitting: Internal Medicine

## 2020-06-03 ENCOUNTER — Telehealth: Payer: Self-pay | Admitting: Internal Medicine

## 2020-06-03 ENCOUNTER — Encounter: Payer: Self-pay | Admitting: Internal Medicine

## 2020-06-03 VITALS — BP 124/78 | HR 63 | Temp 98.0°F | Ht 62.6 in | Wt 169.2 lb

## 2020-06-03 DIAGNOSIS — I10 Essential (primary) hypertension: Secondary | ICD-10-CM | POA: Diagnosis not present

## 2020-06-03 DIAGNOSIS — E220 Acromegaly and pituitary gigantism: Secondary | ICD-10-CM

## 2020-06-03 DIAGNOSIS — E039 Hypothyroidism, unspecified: Secondary | ICD-10-CM | POA: Diagnosis not present

## 2020-06-03 DIAGNOSIS — E7800 Pure hypercholesterolemia, unspecified: Secondary | ICD-10-CM

## 2020-06-03 DIAGNOSIS — D352 Benign neoplasm of pituitary gland: Secondary | ICD-10-CM | POA: Diagnosis not present

## 2020-06-03 DIAGNOSIS — K760 Fatty (change of) liver, not elsewhere classified: Secondary | ICD-10-CM

## 2020-06-03 DIAGNOSIS — Z0000 Encounter for general adult medical examination without abnormal findings: Secondary | ICD-10-CM

## 2020-06-03 DIAGNOSIS — R739 Hyperglycemia, unspecified: Secondary | ICD-10-CM | POA: Diagnosis not present

## 2020-06-03 MED ORDER — METOPROLOL TARTRATE 50 MG PO TABS
ORAL_TABLET | Freq: Two times a day (BID) | ORAL | 1 refills | Status: DC
  Filled 2020-06-03: qty 180, 90d supply, fill #0
  Filled 2020-09-02: qty 180, 90d supply, fill #1

## 2020-06-03 NOTE — Telephone Encounter (Signed)
Please advise, would you like for me to re order the labs from 04/12/20?

## 2020-06-03 NOTE — Telephone Encounter (Signed)
Patient was seen in office today, Dr Nicki Reaper would like her to do labs before her September appointment. No labs are in chart, but patient will do her labs at [place].

## 2020-06-03 NOTE — Assessment & Plan Note (Addendum)
Physical today 06/03/20.  PAP 01/24/18 - negative with negative HPV.  Colonoscopy 02/2017 - tubular adenoma. Recommended f/u in 5 years.  Mammogram 05/28/20 - Birads I.

## 2020-06-03 NOTE — Progress Notes (Signed)
Patient ID: Marie Colon, female   DOB: December 05, 1955, 65 y.o.   MRN: [id number]   Subjective:    Patient ID: Marie Colon, female    DOB: [birth year], 65 y.o.   MRN: [id number]  HPI This visit occurred during the SARS-CoV-2 public health emergency.  Safety protocols were in place, including screening questions prior to the visit, additional usage of staff PPE, and extensive cleaning of exam room while observing appropriate contact time as indicated for disinfecting solutions.  Patient here for her physical exam.  She reports she is doing relatively well. Still with some increased stress.  Discussed.  Overall she is handling things relatively well.  Tries to stay active. No chest pain or sob reported.  No abdominal pain or bowel change reported. Has noticed occasionally - pills hang at night.  Takes multiple pills. Discussed splitting pills.  Make sure drinking an adequate amount.  Follow.  If persistent problems, will require further w/up.    Past Medical History:  Diagnosis Date  . Acromegaly ([place])   . Actinic keratosis   . Angiomyolipoma of kidney    evaluated by Dr Bernardo Heater  . Fatty liver   . GERD (gastroesophageal reflux disease)   . Heart murmur    per Dr. Einar Pheasant  . History of pyelonephritis   . History of ulcerative colitis   . Hx of dysplastic nevus 04/21/2016   L upper back, moderate to severe atypia  . Hypercholesterolemia   . Hypertension   . Hypothyroidism   . Pituitary macroadenoma (HCC)    s/p removal (elevated growth hormone)  . Tubal pregnancy    s/p rupture  . Wears glasses    Past Surgical History:  Procedure Laterality Date  . ABDOMINAL HYSTERECTOMY  2003   abnormal uterine bleeding  . APPENDECTOMY    . CHOLECYSTECTOMY  2005  . COLONOSCOPY WITH PROPOFOL N/A 03/18/2017   Procedure: COLONOSCOPY WITH PROPOFOL;  Surgeon: Lollie Sails, MD;  Location: [facility] ENDOSCOPY;  Service: Endoscopy;  Laterality: N/A;  . DILATION AND CURETTAGE OF UTERUS    .  MASS EXCISION Right 08/29/2015   Procedure: EXCISION OF RIGHT THIGH MASS SUBFASICAL 15 CM;  Surgeon: Stark Klein, MD;  Location: [place];  Service: General;  Laterality: Right;  . pituitary tumor removal  2006   Family History  Problem Relation Age of Onset  . Brain cancer Father        died - 17  . Diabetes Mother   . Hypothyroidism Sister   . Breast cancer Sister 86  . Sjogren's syndrome Sister   . Breast cancer Sister 32  . Diabetes Other        grandmother   Social History   Socioeconomic History  . Marital status: Married    Spouse name: Not on file  . Number of children: 2  . Years of education: Not on file  . Highest education level: Not on file  Occupational History    Employer: [employer]    Comment: [employer] - [employer]  Tobacco Use  . Smoking status: Never Smoker  . Smokeless tobacco: Never Used  Vaping Use  . Vaping Use: Never used  Substance and Sexual Activity  . Alcohol use: No    Alcohol/week: 0.0 standard drinks  . Drug use: No  . Sexual activity: Not on file  Other Topics Concern  . Not on file  Social History Narrative  . Not on file   Social Determinants of Health   Financial Resource Strain:  Not on file  Food Insecurity: Not on file  Transportation Needs: Not on file  Physical Activity: Not on file  Stress: Not on file  Social Connections: Not on file    Outpatient Encounter Medications as of 06/03/2020  Medication Sig  . allopurinol (ZYLOPRIM) 100 MG tablet TAKE 1 TABLET BY MOUTH DAILY.  Marland Kitchen aspirin EC 81 MG tablet Take 81 mg by mouth daily.  . cabergoline (DOSTINEX) 0.5 MG tablet Take 0.5 tablets (0.25 mg total) by mouth once daily  . lansoprazole (PREVACID) 30 MG capsule TAKE 1 CAPSULE BY MOUTH DAILY AT 12 NOON.  Marland Kitchen levothyroxine (SYNTHROID) 100 MCG tablet TAKE 1 TABLET BY MOUTH DAILY BEFORE BREAKFAST.  Marland Kitchen METRONIDAZOLE, TOPICAL, 0.75 % LOTN Apply 1 application topically at bedtime. qhs to face for Rosacea  . Multiple Vitamin (MULTIVITAMIN) tablet Take 1  tablet by mouth daily.  . ondansetron (ZOFRAN ODT) 8 MG disintegrating tablet Take 1 tablet (8 mg total) by mouth every 8 (eight) hours as needed for nausea or vomiting.  . polyethylene glycol powder (GLYCOLAX/MIRALAX) powder Take with your choice of fluid. This is safe to use on a daily basis if needed.  . pravastatin (PRAVACHOL) 40 MG tablet TAKE 1 TABLET (40 MG TOTAL) BY MOUTH DAILY.  Marland Kitchen triamterene-hydrochlorothiazide (DYAZIDE) 37.5-25 MG capsule TAKE 1 CAPSULE BY MOUTH DAILY.  . [DISCONTINUED] metoprolol tartrate (LOPRESSOR) 50 MG tablet TAKE 1 TABLET BY MOUTH TWICE DAILY  . metoprolol tartrate (LOPRESSOR) 50 MG tablet TAKE 1 TABLET BY MOUTH TWICE DAILY  . [DISCONTINUED] cabergoline (DOSTINEX) 0.5 MG tablet Take 1 tablet by mouth at bedtime. (Patient not taking: Reported on 06/03/2020)  . [DISCONTINUED] cabergoline (DOSTINEX) 0.5 MG tablet TAKE 1 TABLET BY MOUTH ONCE DAILY (Patient not taking: Reported on 06/03/2020)   No facility-administered encounter medications on file as of 06/03/2020.    Review of Systems  Constitutional: Negative for appetite change and unexpected weight change.  HENT: Negative for congestion, sinus pressure and sore throat.   Eyes: Negative for pain and visual disturbance.  Respiratory: Negative for cough, chest tightness and shortness of breath.   Cardiovascular: Negative for chest pain, palpitations and leg swelling.  Gastrointestinal: Negative for abdominal pain, diarrhea, nausea and vomiting.  Genitourinary: Negative for difficulty urinating and dysuria.  Musculoskeletal: Negative for joint swelling and myalgias.  Skin: Negative for color change and rash.  Neurological: Negative for dizziness, light-headedness and headaches.  Hematological: Negative for adenopathy. Does not bruise/bleed easily.  Psychiatric/Behavioral: Negative for agitation and dysphoric mood.       Objective:    Physical Exam Vitals reviewed.  Constitutional:      General: She is not in  acute distress.    Appearance: Normal appearance. She is well-developed.  HENT:     Head: Normocephalic and atraumatic.     Right Ear: External ear normal.     Left Ear: External ear normal.  Eyes:     General: No scleral icterus.       Right eye: No discharge.        Left eye: No discharge.     Conjunctiva/sclera: Conjunctivae normal.  Neck:     Thyroid: No thyromegaly.  Cardiovascular:     Rate and Rhythm: Normal rate and regular rhythm.  Pulmonary:     Effort: No tachypnea, accessory muscle usage or respiratory distress.     Breath sounds: Normal breath sounds. No decreased breath sounds or wheezing.  Chest:  Breasts:     Right: No inverted nipple, mass, nipple  discharge or tenderness (no axillary adenopathy).     Left: No inverted nipple, mass, nipple discharge or tenderness (no axilarry adenopathy).    Abdominal:     General: Bowel sounds are normal.     Palpations: Abdomen is soft.     Tenderness: There is no abdominal tenderness.  Musculoskeletal:        General: No swelling or tenderness.     Cervical back: Neck supple. No tenderness.  Lymphadenopathy:     Cervical: No cervical adenopathy.  Skin:    Findings: No erythema or rash.  Neurological:     Mental Status: She is alert and oriented to person, place, and time.  Psychiatric:        Mood and Affect: Mood normal.        Behavior: Behavior normal.     BP 124/78 (BP Location: Left Arm, Patient Position: Sitting, Cuff Size: Normal)   Pulse 63   Temp 98 F (36.7 C) (Oral)   Ht 5' 2.6" (1.59 m)   Wt 169 lb 3.2 oz (76.7 kg)   SpO2 98%   BMI 30.36 kg/m  Wt Readings from Last 3 Encounters:  06/03/20 169 lb 3.2 oz (76.7 kg)  04/30/20 173 lb (78.5 kg)  01/01/20 170 lb 12.8 oz (77.5 kg)     Lab Results  Component Value Date   WBC 8.6 08/04/2019   HGB 14.1 08/04/2019   HCT 41.0 08/04/2019   PLT 208 08/04/2019   GLUCOSE 94 04/19/2020   CHOL 197 04/19/2020   TRIG 209 (H) 04/19/2020   HDL 37 (L)  04/19/2020   LDLCALC 123 (H) 04/19/2020   ALT 56 (H) 04/19/2020   AST 47 (H) 04/19/2020   NA 145 (H) 04/19/2020   K 3.6 04/19/2020   CL 101 04/19/2020   CREATININE 1.07 (H) 04/19/2020   BUN 12 04/19/2020   CO2 24 04/19/2020   TSH 1.540 04/19/2020   HGBA1C 6.2 (H) 04/19/2020    MM 3D SCREEN BREAST BILATERAL  Result Date: 05/28/2020 CLINICAL DATA:  Screening. EXAM: DIGITAL SCREENING BILATERAL MAMMOGRAM WITH TOMOSYNTHESIS AND CAD TECHNIQUE: Bilateral screening digital craniocaudal and mediolateral oblique mammograms were obtained. Bilateral screening digital breast tomosynthesis was performed. The images were evaluated with computer-aided detection. COMPARISON:  Previous exam(s). ACR Breast Density Category b: There are scattered areas of fibroglandular density. FINDINGS: There are no findings suspicious for malignancy. The images were evaluated with computer-aided detection. IMPRESSION: No mammographic evidence of malignancy. A result letter of this screening mammogram will be mailed directly to the patient. RECOMMENDATION: Screening mammogram in one year. (Code:SM-B-01Y) BI-RADS CATEGORY  1: Negative. Electronically Signed   By: Kristopher Oppenheim M.D.   On: 05/28/2020 09:20       Assessment & Plan:   Problem List Items Addressed This Visit    Acromegaly ([place])    Continues on cabergoline.  Followed by endocrinology.        Fatty liver    Diet and exercise.  Has seen GI.  Follow liver function tests.  Recent check as outlined.  Lab Results  Component Value Date   ALT 56 (H) 04/19/2020   AST 47 (H) 04/19/2020   ALKPHOS 82 04/19/2020   BILITOT <0.2 04/19/2020        Relevant Orders   Hepatic function panel   Health care maintenance    Physical today 06/03/20.  PAP 01/24/18 - negative with negative HPV.  Colonoscopy 02/2017 - tubular adenoma. Recommended f/u in 5 years.  Mammogram 05/28/20 -  Birads I.       Hypercholesteremia    On pravastatin.  Low cholesterol diet and exercise.   Follow lipid panel and liver function tests.       Relevant Medications   metoprolol tartrate (LOPRESSOR) 50 MG tablet   Hyperglycemia    Low carb diet and exercise.  Follow met b and a1c.       Hypertension    Continue metoprolol and triam/hctz.  Blood pressure as outlined.  No changes in medication.  Follow pressures.  Follow metabolic panel.       Relevant Medications   metoprolol tartrate (LOPRESSOR) 50 MG tablet   Hypothyroidism    On thyroid replacement.  Follow tsh.       Relevant Medications   metoprolol tartrate (LOPRESSOR) 50 MG tablet   Pituitary macroadenoma (HCC)    Previously found - elevated GH - acromegaly.  Followed by Dr Gabriel Carina.  On cabergoline.  Recommended f/u echo in one year.         Other Visit Diagnoses    Routine general medical examination at a health care facility    -  Primary       Einar Pheasant, MD

## 2020-06-04 ENCOUNTER — Other Ambulatory Visit: Payer: Self-pay

## 2020-06-04 NOTE — Assessment & Plan Note (Signed)
Continue metoprolol and triam/hctz.  Blood pressure as outlined.  No changes in medication.  Follow pressures.  Follow metabolic panel.

## 2020-06-04 NOTE — Telephone Encounter (Signed)
Please notify Marie Colon that I would like for her to have a f/u liverp panel checked within the next 2-3 weeks to confirm stable.  I have placed order for [employer] labs.  (she has a history of fatty liver and we have been following).

## 2020-06-04 NOTE — Assessment & Plan Note (Addendum)
Diet and exercise.  Has seen GI.  Follow liver function tests.  Recent check as outlined.  Lab Results  Component Value Date   ALT 56 (H) 04/19/2020   AST 47 (H) 04/19/2020   ALKPHOS 82 04/19/2020   BILITOT <0.2 04/19/2020

## 2020-06-04 NOTE — Telephone Encounter (Signed)
Patient aware.

## 2020-06-06 ENCOUNTER — Other Ambulatory Visit: Payer: Self-pay

## 2020-06-09 NOTE — Assessment & Plan Note (Signed)
On pravastatin.  Low cholesterol diet and exercise.  Follow lipid panel and liver function tests.   

## 2020-06-09 NOTE — Assessment & Plan Note (Signed)
Continues on cabergoline.  Followed by endocrinology.   

## 2020-06-09 NOTE — Assessment & Plan Note (Signed)
Previously found - elevated GH - acromegaly.  Followed by Dr Gabriel Carina.  On cabergoline.  Recommended f/u echo in one year.

## 2020-06-09 NOTE — Assessment & Plan Note (Signed)
Low carb diet and exercise.  Follow met b and a1c.  

## 2020-06-09 NOTE — Assessment & Plan Note (Signed)
On thyroid replacement.  Follow tsh.  

## 2020-06-17 ENCOUNTER — Other Ambulatory Visit: Payer: Self-pay

## 2020-06-18 ENCOUNTER — Other Ambulatory Visit: Payer: Self-pay

## 2020-06-18 MED FILL — Levothyroxine Sodium Tab 100 MCG: ORAL | 90 days supply | Qty: 90 | Fill #0 | Status: AC

## 2020-06-27 DIAGNOSIS — K760 Fatty (change of) liver, not elsewhere classified: Secondary | ICD-10-CM | POA: Diagnosis not present

## 2020-06-28 LAB — HEPATIC FUNCTION PANEL
ALT: 47 IU/L — ABNORMAL HIGH (ref 0–32)
AST: 37 IU/L (ref 0–40)
Albumin: 4.4 g/dL (ref 3.8–4.8)
Alkaline Phosphatase: 84 IU/L (ref 44–121)
Bilirubin Total: 0.3 mg/dL (ref 0.0–1.2)
Bilirubin, Direct: <0.1 mg/dL (ref 0.00–0.40)
Total Protein: 6.5 g/dL (ref 6.0–8.5)

## 2020-08-15 ENCOUNTER — Telehealth (INDEPENDENT_AMBULATORY_CARE_PROVIDER_SITE_OTHER): Payer: 59 | Admitting: Family Medicine

## 2020-08-15 ENCOUNTER — Telehealth: Payer: Self-pay | Admitting: Internal Medicine

## 2020-08-15 ENCOUNTER — Telehealth: Payer: Self-pay

## 2020-08-15 DIAGNOSIS — R059 Cough, unspecified: Secondary | ICD-10-CM | POA: Diagnosis not present

## 2020-08-15 MED ORDER — HYDROCODONE BIT-HOMATROP MBR 5-1.5 MG/5ML PO SOLN: 5.0000 mL | Freq: Three times a day (TID) | ORAL | 0 refills | Status: AC | PRN

## 2020-08-15 MED ORDER — BENZONATATE 200 MG PO CAPS: 200.0000 mg | ORAL_CAPSULE | Freq: Three times a day (TID) | ORAL | 0 refills | Status: DC | PRN

## 2020-08-15 NOTE — Telephone Encounter (Signed)
Evaluated and treated - Dr Lorelei Pont.  See note.

## 2020-08-15 NOTE — Telephone Encounter (Signed)
Spoken with patient and scheduled patient appointment with [facility] virtually today with Dr. Lorelei Pont.

## 2020-08-15 NOTE — Telephone Encounter (Signed)
Patient informed, Due to the high volume of calls and your symptoms we have to forward your call to our Triage Nurse to expedient your call. Please hold for the transfer.  Patient transferred to Access Nurse. Due to they have had a covid test that was negative. They stated they have no fever but do have a constant coughing, throat congestion and headache. She has been taking Delsym and transferred PT to access and advise no apts virt or in office for today or tomorrow.

## 2020-08-15 NOTE — Progress Notes (Signed)
[name] [address], [place], [place] [id number] [phone]  Date:  08/15/2020   Name:  Marie Colon   DOB:  01/31/1955   MRN:  [id number]  PCP:  Einar Pheasant, MD    Chief Complaint: No chief complaint on file.   History of Present Illness:  Marie Colon is a 65 y.o. very pleasant female patient who presents with the following:  Pt seen virtually today for concern of covid 51 Patient location is home, my location is home.  Patient identity confirmed with 2 factors, she gives consent for virtual visit today.  The patient and myself are present on the call Primary patient of Einar Pheasant She is immunized against COVID-19 History of hypertension, hyperlipidemia, acromegaly, microadenoma  Medications include Dyazide, pravastatin, metoprolol, Synthroid, dstinex  Yesterday am she awoke with a cough and congestion She took a test yesterday at 9am- her test turned out to be negative  She also did a home test yesterday and it was also negative Her husband is not sick  She has not noted a fever  No vomiting or diarrhea  No ST, but it does hurt a bit when she will cough a lot   She is not feeling SOB So far for cough she has tried delsym and robitussin  Currently cough is her main symptom, it is making it difficult for her to rest or sleep Patient Active Problem List   Diagnosis Date Noted   Acromegaly ([place]) 09/16/2019   LLQ abdominal pain 08/04/2019   Tick bite of upper arm, left, initial encounter 06/27/2019   Nonintractable headache 06/27/2019   Rash 06/27/2019   Breast pain, left 03/27/2019   Hyperglycemia 01/25/2018   Gout 05/26/2015   Soft tissue mass 01/06/2015   Health care maintenance 04/29/2014   BMI 33.0-33.9,adult 12/15/2013   Renal insufficiency 04/05/2013   Family history of breast cancer 12/11/2012   Gastritis 11/28/2012   Family history of colonic polyps 11/28/2012   Pituitary  macroadenoma (Marie Colon) 12/05/2011   Fatty liver 12/05/2011   Angiomyolipoma of kidney 12/05/2011   Hypothyroidism 12/05/2011   Hypertension 11/24/2011   Hypercholesteremia 11/24/2011    Past Medical History:  Diagnosis Date   Acromegaly ([place])    Actinic keratosis    Angiomyolipoma of kidney    evaluated by Dr Bernardo Heater   Fatty liver    GERD (gastroesophageal reflux disease)    Heart murmur    per Dr. Einar Pheasant   History of pyelonephritis    History of ulcerative colitis    Hx of dysplastic nevus 04/21/2016   L upper back, moderate to severe atypia   Hypercholesterolemia    Hypertension    Hypothyroidism    Pituitary macroadenoma ([place])    s/p removal (elevated growth hormone)   Tubal pregnancy    s/p rupture   Wears glasses     Past Surgical History:  Procedure Laterality Date   ABDOMINAL HYSTERECTOMY  2003   abnormal uterine bleeding   APPENDECTOMY     CHOLECYSTECTOMY  2005   COLONOSCOPY WITH PROPOFOL N/A 03/18/2017   Procedure: COLONOSCOPY WITH PROPOFOL;  Surgeon: Lollie Sails, MD;  Location: [facility] ENDOSCOPY;  Service: Endoscopy;  Laterality: N/A;   DILATION AND CURETTAGE OF UTERUS     MASS EXCISION Right 08/29/2015   Procedure: EXCISION OF RIGHT THIGH MASS SUBFASICAL 15 CM;  Surgeon: Stark Klein, MD;  Location: [place];  Service: General;  Laterality:  Right;   pituitary tumor removal  2006    Social History   Tobacco Use   Smoking status: Never   Smokeless tobacco: Never  Vaping Use   Vaping Use: Never used  Substance Use Topics   Alcohol use: No    Alcohol/week: 0.0 standard drinks   Drug use: No    Family History  Problem Relation Age of Onset   Brain cancer Father        died - 34   Diabetes Mother    Hypothyroidism Sister    Breast cancer Sister 52   Sjogren's syndrome Sister    Breast cancer Sister 25   Diabetes Other        grandmother    Allergies  Allergen Reactions   Protonix [Pantoprazole] Other (See Comments)    Headaches     Penicillins Rash    Has patient had a PCN reaction causing immediate rash, facial/tongue/throat swelling, SOB or lightheadedness with hypotension: Yes Has patient had a PCN reaction causing severe rash involving mucus membranes or skin necrosis: Yes Has patient had a PCN reaction that required hospitalization No Has patient had a PCN reaction occurring within the last 10 years: Yes If all of the above answers are "NO", then may proceed with Cephalosporin use.     Medication list has been reviewed and updated.  Current Outpatient Medications on File Prior to Visit  Medication Sig Dispense Refill   allopurinol (ZYLOPRIM) 100 MG tablet TAKE 1 TABLET BY MOUTH DAILY. 90 tablet 3   aspirin EC 81 MG tablet Take 81 mg by mouth daily.     cabergoline (DOSTINEX) 0.5 MG tablet Take 1/2 tablets (0.25 mg total) by mouth once daily 45 tablet 3   lansoprazole (PREVACID) 30 MG capsule TAKE 1 CAPSULE BY MOUTH DAILY AT 12 NOON. 90 capsule 0   levothyroxine (SYNTHROID) 100 MCG tablet TAKE 1 TABLET BY MOUTH DAILY BEFORE BREAKFAST. 90 tablet 2   metoprolol tartrate (LOPRESSOR) 50 MG tablet TAKE 1 TABLET BY MOUTH TWICE DAILY 180 tablet 1   METRONIDAZOLE, TOPICAL, 0.75 % LOTN Apply 1 application topically at bedtime. qhs to face for Rosacea 60 mL 11   Multiple Vitamin (MULTIVITAMIN) tablet Take 1 tablet by mouth daily.     ondansetron (ZOFRAN ODT) 8 MG disintegrating tablet Take 1 tablet (8 mg total) by mouth every 8 (eight) hours as needed for nausea or vomiting. 20 tablet 0   polyethylene glycol powder (GLYCOLAX/MIRALAX) powder Take with your choice of fluid. This is safe to use on a daily basis if needed. 3350 g 1   pravastatin (PRAVACHOL) 40 MG tablet TAKE 1 TABLET (40 MG TOTAL) BY MOUTH DAILY. 90 tablet 1   triamterene-hydrochlorothiazide (DYAZIDE) 37.5-25 MG capsule TAKE 1 CAPSULE BY MOUTH DAILY. 90 capsule 1   No current facility-administered medications on file prior to visit.    Review of  Systems:  As per HPI- otherwise negative.   Physical Examination: There were no vitals filed for this visit. There were no vitals filed for this visit. There is no height or weight on file to calculate BMI. Ideal Body Weight:   Patient observed via video monitor.  She looks well, no cough or shortness of breath is noted.  She is not currently checking vital signs at home  Assessment and Plan: Cough - Plan: benzonatate (TESSALON) 200 MG capsule, HYDROcodone bit-homatropine (HYCODAN) 5-1.5 MG/5ML syrup Virtual visit today for concern of cough.  She has tested for COVID twice yesterday-first day of  illness-and both negative. We discussed her symptoms.  She denies any shortness of breath or distress.  I will send in [facility] for cough.  We discussed potentially using a small amount of hydrocodone cough syrup.  After discussion of risks and benefits she would like to use this as she is having difficulty sleeping.  She is advised not to use this medication if she is driving  I did encourage her to test again for COVID tomorrow, as she may have had a false negative test.  She will seek care if any worsening or other concerns Video used for duration of visit today Signed Lamar Blinks, MD

## 2020-08-15 NOTE — Telephone Encounter (Signed)
Pt has been contacted by Fransisco Beau and scheduled for a virtual visit. See separate Telephone note on 08/15/20

## 2020-08-15 NOTE — Telephone Encounter (Signed)
Called Marie Colon to triage. Could not leave a message as mailbox was full

## 2020-08-15 NOTE — Telephone Encounter (Signed)
Marie Colon called into Access Nurse stating that she is having coughing, congestion, and headache and tested negative for covid. Pt was instructed to See PCP within 24 hours from access nurse.

## 2020-08-16 ENCOUNTER — Other Ambulatory Visit: Payer: Self-pay

## 2020-08-16 ENCOUNTER — Encounter: Payer: Self-pay | Admitting: Emergency Medicine

## 2020-08-16 ENCOUNTER — Encounter: Payer: Self-pay | Admitting: Internal Medicine

## 2020-08-16 ENCOUNTER — Ambulatory Visit
Admission: EM | Admit: 2020-08-16 | Discharge: 2020-08-16 | Disposition: A | Discharge status: Home / Self Care | Payer: 59 | Attending: Physician Assistant | Admitting: Physician Assistant

## 2020-08-16 ENCOUNTER — Telehealth: Payer: Self-pay

## 2020-08-16 DIAGNOSIS — B349 Viral infection, unspecified: Secondary | ICD-10-CM | POA: Diagnosis not present

## 2020-08-16 DIAGNOSIS — R110 Nausea: Secondary | ICD-10-CM | POA: Insufficient documentation

## 2020-08-16 DIAGNOSIS — Z880 Allergy status to penicillin: Secondary | ICD-10-CM | POA: Diagnosis not present

## 2020-08-16 DIAGNOSIS — R197 Diarrhea, unspecified: Secondary | ICD-10-CM | POA: Diagnosis not present

## 2020-08-16 DIAGNOSIS — R059 Cough, unspecified: Secondary | ICD-10-CM | POA: Insufficient documentation

## 2020-08-16 DIAGNOSIS — K921 Melena: Secondary | ICD-10-CM | POA: Diagnosis not present

## 2020-08-16 DIAGNOSIS — U071 COVID-19: Secondary | ICD-10-CM | POA: Insufficient documentation

## 2020-08-16 LAB — POCT URINALYSIS DIP (DEVICE)
Bilirubin Urine: NEGATIVE
Glucose, UA: NEGATIVE mg/dL
Ketones, ur: NEGATIVE mg/dL
Leukocytes,Ua: NEGATIVE
Nitrite: NEGATIVE
Protein, ur: NEGATIVE mg/dL
Specific Gravity, Urine: 1.02 (ref 1.005–1.030)
Urobilinogen, UA: 0.2 mg/dL (ref 0.0–1.0)
pH: 5.5 (ref 5.0–8.0)

## 2020-08-16 NOTE — Discharge Instructions (Addendum)
Your vital signs are all reassuring.  You do not have a fever.  Your blood pressure is normal.  It does not significantly drop whenever you change positions which is also good.  Your urine is a little bit concentrated see you are mildly dehydrated but nothing significant.  You do need to increase your rest and fluids and take Pedialyte, vitamin water or some other liquids with electrolytes especially since you have had significant diarrhea.  Take your at home Zofran as needed for the nausea and make sure you are eating.  Take the cough medication that has been prescribed to you by your PCP.  If you start to feel significantly more weak/fatigued or dehydrated he may need to go to the emergency department to have the labs drawn and IV fluids.  COVID test will be back within 6 to 24 hours.  Someone will contact you if positive. If severe abdominal pain or black stools go to ED.   You have received COVID testing today either for positive exposure, concerning symptoms that could be related to COVID infection, screening purposes, or re-testing after confirmed positive.  Your test obtained today checks for active viral infection in the last 1-2 weeks. If your test is negative now, you can still test positive later. So, if you do develop symptoms you should either get re-tested and/or isolate x 5 days and then strict mask use x 5 days (unvaccinated) or mask use x 10 days (vaccinated). Please follow CDC guidelines.  While Rapid antigen tests come back in 15-20 minutes, send out PCR/molecular test results typically come back within 1-3 days. In the mean time, if you are symptomatic, assume this could be a positive test and treat/monitor yourself as if you do have COVID.   We will call with test results if positive. Please download the MyChart app and set up a profile to access test results.   If symptomatic, go home and rest. Push fluids. Take Tylenol as needed for discomfort. Gargle warm salt water. Throat lozenges.  Take Mucinex DM or Robitussin for cough. Humidifier in bedroom to ease coughing. Warm showers. Also review the COVID handout for more information.  COVID-19 INFECTION: The incubation period of COVID-19 is approximately 14 days after exposure, with most symptoms developing in roughly 4-5 days. Symptoms may range in severity from mild to critically severe. Roughly 80% of those infected will have mild symptoms. People of any age may become infected with COVID-19 and have the ability to transmit the virus. The most common symptoms include: fever, fatigue, cough, body aches, headaches, sore throat, nasal congestion, shortness of breath, nausea, vomiting, diarrhea, changes in smell and/or taste.    COURSE OF ILLNESS Some patients may begin with mild disease which can progress quickly into critical symptoms. If your symptoms are worsening please call ahead to the Emergency Department and proceed there for further treatment. Recovery time appears to be roughly 1-2 weeks for mild symptoms and 3-6 weeks for severe disease.   GO IMMEDIATELY TO ER FOR FEVER YOU ARE UNABLE TO GET DOWN WITH TYLENOL, BREATHING PROBLEMS, CHEST PAIN, FATIGUE, LETHARGY, INABILITY TO EAT OR DRINK, ETC  QUARANTINE AND ISOLATION: To help decrease the spread of COVID-19 please remain isolated if you have COVID infection or are highly suspected to have COVID infection. This means -stay home and isolate to one room in the home if you live with others. Do not share a bed or bathroom with others while ill, sanitize and wipe down all countertops and keep  common areas clean and disinfected. Stay home for 5 days. If you have no symptoms or your symptoms are resolving after 5 days, you can leave your house. Continue to wear a mask around others for 5 additional days. If you have been in close contact (within 6 feet) of someone diagnosed with COVID 19, you are advised to quarantine in your home for 14 days as symptoms can develop anywhere from 2-14 days  after exposure to the virus. If you develop symptoms, you  must isolate.  Most current guidelines for COVID after exposure -unvaccinated: isolate 5 days and strict mask use x 5 days. Test on day 5 is possible -vaccinated: wear mask x 10 days if symptoms do not develop -You do not necessarily need to be tested for COVID if you have + exposure and  develop symptoms. Just isolate at home x10 days from symptom onset During this global pandemic, CDC advises to practice social distancing, try to stay at least 68f away from others at all times. Wear a face covering. Wash and sanitize your hands regularly and avoid going anywhere that is not necessary.  KEEP IN MIND THAT THE COVID TEST IS NOT 100% ACCURATE AND YOU SHOULD STILL DO EVERYTHING TO PREVENT POTENTIAL SPREAD OF VIRUS TO OTHERS (WEAR MASK, WEAR GLOVES, WParkerHANDS AND SANITIZE REGULARLY). IF INITIAL TEST IS NEGATIVE, THIS MAY NOT MEAN YOU ARE DEFINITELY NEGATIVE. MOST ACCURATE TESTING IS DONE 5-7 DAYS AFTER EXPOSURE.   It is not advised by CDC to get re-tested after receiving a positive COVID test since you can still test positive for weeks to months after you have already cleared the virus.   *If you have not been vaccinated for COVID, I strongly suggest you consider getting vaccinated as long as there are no contraindications.

## 2020-08-16 NOTE — Telephone Encounter (Signed)
FYI for you- patient is going to urgent care.  Called patient to get more info. She has had about 5 small loose stools today. All of them were dark except for this last one. She was taking pepto bismol but has not had any since Monday. She has had dark stools since then. No abdominal pain, tenderness, nausea, vomiting, etc. Patient says she is not eating very much because she doesn't really have an appetite. She was advised to do a repeat COVID test tomorrow. Still coughing. Denies sob, chest tightness, fever, etc. After talking with patient advised that she may need an in person evaluation to confirm nothing more acute going on. Patient agreed and is going to [facility] today.

## 2020-08-16 NOTE — ED Provider Notes (Signed)
[facility]    CSN: [id number] Arrival date & time: 08/16/20  1542      History   Chief Complaint Chief Complaint  Patient presents with   Diarrhea   Cough    HPI Marie Colon is a 65 y.o. female presenting with intermittent diarrhea over the past 5 days.  Patient states that last Sunday she had multiple episodes of diarrhea and fatigue.  She says she took some Pepto-Bismol and noted she had dark stools after that.  He says she stopped taking the Pepto-Bismol and her stools did not seem to start.  They seem to resolve for a couple of days until her diarrhea resumed days ago.  Additionally she states that she started to have increased fatigue, cough and congestion 2 days ago.  No fevers.  No body aches.  Denies any abdominal pain but has occasional cramping.  Admits to nausea and 2 episodes of posttussive vomiting.  Patient says her appetite has been decreased and she has not been drinking as much fluid as she knows that she needs to.  She did have a negative COVID test recently through her work.  States that she contacted her PCP about her symptoms and was prescribed Hycodan.  She has not picked up the cough medication yet.  She has just been taking Delsym over-the-counter.  She denies any chest pain or breathing difficulty.  Patient states that her stools are still dark but they are not black.  Says they are dark brown.  Has not seen any blood in the stool.  Has not been taking any antidiarrheal medication.  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  Her husband is ill with similar symptoms.  She has been vaccinated for COVID-19 x3.  No other complaints or concerns.  HPI  Past Medical History:  Diagnosis Date   Acromegaly ([place])    Actinic keratosis    Angiomyolipoma of kidney    evaluated by Dr Bernardo Heater   Fatty liver    GERD (gastroesophageal  reflux disease)    Heart murmur    per Dr. Einar Pheasant   History of pyelonephritis    History of ulcerative colitis    Hx of dysplastic nevus 04/21/2016   L upper back, moderate to severe atypia   Hypercholesterolemia    Hypertension    Hypothyroidism    Pituitary macroadenoma ([place])    s/p removal (elevated growth hormone)   Tubal pregnancy    s/p rupture   Wears glasses     Patient Active Problem List   Diagnosis Date Noted   Acromegaly ([place]) 09/16/2019   LLQ abdominal pain 08/04/2019   Tick bite of upper arm, left, initial encounter 06/27/2019   Nonintractable headache 06/27/2019   Rash 06/27/2019   Breast pain, left 03/27/2019   Hyperglycemia 01/25/2018   Gout 05/26/2015   Soft tissue mass 01/06/2015   Health care maintenance 04/29/2014   BMI 33.0-33.9,adult 12/15/2013   Renal insufficiency 04/05/2013   Family history of breast cancer 12/11/2012   Gastritis 11/28/2012   Family history of colonic polyps 11/28/2012   Pituitary macroadenoma ([place]) 12/05/2011   Fatty liver 12/05/2011   Angiomyolipoma of kidney 12/05/2011   Hypothyroidism 12/05/2011   Hypertension 11/24/2011   Hypercholesteremia 11/24/2011    Past Surgical History:  Procedure Laterality Date   ABDOMINAL HYSTERECTOMY  2003   abnormal uterine bleeding  APPENDECTOMY     CHOLECYSTECTOMY  2005   COLONOSCOPY WITH PROPOFOL N/A 03/18/2017   Procedure: COLONOSCOPY WITH PROPOFOL;  Surgeon: Lollie Sails, MD;  Location: [facility] ENDOSCOPY;  Service: Endoscopy;  Laterality: N/A;   DILATION AND CURETTAGE OF UTERUS     MASS EXCISION Right 08/29/2015   Procedure: EXCISION OF RIGHT THIGH MASS SUBFASICAL 15 CM;  Surgeon: Stark Klein, MD;  Location: [facility] OR;  Service: General;  Laterality: Right;   pituitary tumor removal  2006    OB History   No obstetric history on file.      Home Medications    Prior to Admission medications   Medication Sig Start Date End Date Taking? Authorizing Provider   allopurinol (ZYLOPRIM) 100 MG tablet TAKE 1 TABLET BY MOUTH DAILY. 08/30/19 09/02/20 Yes Einar Pheasant, MD  aspirin EC 81 MG tablet Take 81 mg by mouth daily.   Yes [provider]  cabergoline (DOSTINEX) 0.5 MG tablet Take 1/2 tablets (0.25 mg total) by mouth once daily 05/08/20  Yes   lansoprazole (PREVACID) 30 MG capsule TAKE 1 CAPSULE BY MOUTH DAILY AT 12 NOON. 05/30/20 05/30/21 Yes Einar Pheasant, MD  levothyroxine (SYNTHROID) 100 MCG tablet TAKE 1 TABLET BY MOUTH DAILY BEFORE BREAKFAST. 03/19/20 03/19/21 Yes Einar Pheasant, MD  metoprolol tartrate (LOPRESSOR) 50 MG tablet TAKE 1 TABLET BY MOUTH TWICE DAILY 06/03/20 06/03/21 Yes Einar Pheasant, MD  Multiple Vitamin (MULTIVITAMIN) tablet Take 1 tablet by mouth daily.   Yes [provider]  pravastatin (PRAVACHOL) 40 MG tablet TAKE 1 TABLET (40 MG TOTAL) BY MOUTH DAILY. 05/30/20 05/30/21 Yes Einar Pheasant, MD  triamterene-hydrochlorothiazide (DYAZIDE) 37.5-25 MG capsule TAKE 1 CAPSULE BY MOUTH DAILY. 02/16/20 02/15/21 Yes Einar Pheasant, MD  benzonatate (TESSALON) 200 MG capsule Take 1 capsule (200 mg total) by mouth 3 (three) times daily as needed for cough. 08/15/20   Copland, Gay Filler, MD  HYDROcodone bit-homatropine (HYCODAN) 5-1.5 MG/5ML syrup Take 5 mLs by mouth every 8 (eight) hours as needed for up to 5 days for cough. 08/15/20 08/20/20  Copland, Gay Filler, MD  METRONIDAZOLE, TOPICAL, 0.75 % LOTN Apply 1 application topically at bedtime. qhs to face for Rosacea 09/06/19   Ralene Bathe, MD  ondansetron (ZOFRAN ODT) 8 MG disintegrating tablet Take 1 tablet (8 mg total) by mouth every 8 (eight) hours as needed for nausea or vomiting. 04/30/20   Margarette Canada, NP  polyethylene glycol powder [facility]) powder Take with your choice of fluid. This is safe to use on a daily basis if needed. 02/03/12   Rey, Latina Craver, NP    Family History Family History  Problem Relation Age of Onset   Brain cancer Father        died - 55   Diabetes  Mother    Hypothyroidism Sister    Breast cancer Sister 71   Sjogren's syndrome Sister    Breast cancer Sister 7   Diabetes Other        grandmother    Social History Social History   Tobacco Use   Smoking status: Never   Smokeless tobacco: Never  Vaping Use   Vaping Use: Never used  Substance Use Topics   Alcohol use: No    Alcohol/week: 0.0 standard drinks   Drug use: No     Allergies   Protonix [pantoprazole] and Penicillins   Review of Systems Review of Systems  Constitutional:  Positive for appetite change and fatigue. Negative for chills, diaphoresis and fever.  HENT:  Positive for congestion and rhinorrhea. Negative for ear pain, sinus pressure, sinus pain and sore throat.   Respiratory:  Positive for cough. Negative for shortness of breath.   Cardiovascular:  Negative for chest pain and palpitations.  Gastrointestinal:  Positive for diarrhea, nausea and vomiting. Negative for abdominal pain and blood in stool.  Musculoskeletal:  Negative for arthralgias and myalgias.  Skin:  Negative for rash.  Neurological:  Negative for weakness and headaches.  Hematological:  Negative for adenopathy.    Physical Exam Triage Vital Signs ED Triage Vitals  Enc Vitals Group     BP 08/16/20 1638 120/79     Pulse Rate 08/16/20 1638 70     Resp 08/16/20 1638 18     Temp 08/16/20 1638 98.9 F (37.2 C)     Temp Source 08/16/20 1638 Oral     SpO2 08/16/20 1638 96 %     Weight 08/16/20 1636 169 lb 1.5 oz (76.7 kg)     Height 08/16/20 1636 '5\' 2"'$  (1.575 m)     Head Circumference --      Peak Flow --      Pain Score 08/16/20 1636 3     Pain Loc --      Pain Edu? --      Excl. in [place]? --    Orthostatic VS for the past 24 hrs:  BP- Lying Pulse- Lying BP- Sitting Pulse- Sitting BP- Standing at 0 minutes Pulse- Standing at 0 minutes  08/16/20 1750 112/73 59 109/76 66 107/77 63    Updated Vital Signs BP 120/79 (BP Location: Right Arm)   Pulse 70   Temp 98.9 F (37.2 C)  (Oral)   Resp 18   Ht '5\' 2"'$  (1.575 m)   Wt 169 lb 1.5 oz (76.7 kg)   SpO2 96%   BMI 30.93 kg/m      Physical Exam Vitals and nursing note reviewed.  Constitutional:      General: She is not in acute distress.    Appearance: Normal appearance. She is ill-appearing. She is not toxic-appearing.  HENT:     Head: Normocephalic and atraumatic.     Right Ear: Tympanic membrane, ear canal and external ear normal.     Left Ear: Tympanic membrane, ear canal and external ear normal.     Nose: Congestion present.     Mouth/Throat:     Mouth: Mucous membranes are moist.     Pharynx: Oropharynx is clear. No posterior oropharyngeal erythema.  Eyes:     General: No scleral icterus.       Right eye: No discharge.        Left eye: No discharge.     Conjunctiva/sclera: Conjunctivae normal.  Cardiovascular:     Rate and Rhythm: Normal rate and regular rhythm.     Heart sounds: Normal heart sounds.  Pulmonary:     Effort: Pulmonary effort is normal. No respiratory distress.     Breath sounds: Normal breath sounds. No wheezing, rhonchi or rales.  Abdominal:     Palpations: Abdomen is soft.     Tenderness: There is abdominal tenderness (mild generalized TTP).  Musculoskeletal:     Cervical back: Neck supple.  Skin:    General: Skin is dry.  Neurological:     General: No focal deficit present.     Mental Status: She is alert. Mental status is at baseline.     Motor: No weakness.     Gait: Gait normal.  Psychiatric:  Mood and Affect: Mood normal.        Behavior: Behavior normal.        Thought Content: Thought content normal.     UC Treatments / Results  Labs (all labs ordered are listed, but only abnormal results are displayed) Labs Reviewed  SARS CORONAVIRUS 2 (TAT 6-24 HRS) - Abnormal; Notable for the following components:      Result Value   SARS Coronavirus 2 POSITIVE (*)    All other components within normal limits  POCT URINALYSIS DIP (DEVICE) - Abnormal; Notable for  the following components:   Hgb urine dipstick TRACE (*)    All other components within normal limits    EKG   Radiology No results found.  Procedures Procedures (including critical care time)  Medications Ordered in UC Medications - No data to display  Initial Impression / Assessment and Plan / UC Course  I have reviewed the triage vital signs and the nursing notes.  Pertinent labs & imaging results that were available during my care of the patient were reviewed by me and considered in my medical decision making (see chart for details).  65 year old female presenting with 2-day history of cough and congestion and approximately 5-day history of intermittent diarrhea.  No fevers but has been significantly fatigued.  She has also had a few episodes of posttussive vomiting.  No shortness of breath or chest pain. Patient concerned about possibly being dehydrated since she has not been drinking or eating very much.  Denies any dizziness upon standing or feeling faint or passing out.   Vital signs are all normal and stable.  Patient is ill-appearing but nontoxic.  She is lying on the exam bed.  Exam is significant for minor congestion.  She also has generalized abdominal tenderness.  Her chest is clear to auscultation heart regular rate and rhythm.  UA performed which shows 1.020 specific gravity.  Patient does not appear to be orthostatic.  She could possibly be mildly dehydrated but nothing significant.  I did advise her of this and suggested that she increase her fluid intake.  I offered Zofran but she already has some at home.  Advised her to take that and try to eat something but most importantly she should be rehydrating.  Reviewed when to go to ED for signs of dehydration and possibly needing IV fluids.  PCR COVID test obtained.  Current CDC guidelines, isolation protocol and ED precautions reviewed with patient.  Advised her if she does test positive I am unsure of when her symptoms  started.  Could be related to 5 days ago and the diarrhea started or just 2 days ago and the cough and congestion started we did discuss starting antiviral medication and patient states that she would still like to start an antiviral medication if she does test positive.  Advised her I can go over that more with her if she test positive.  For now she should go home and rest increase fluids.  Her symptoms are very consistent with a viral illness, high suspicion for COVID-19.  Her PCP sent Hycodan cough syrup to the pharmacy.  Advised her to pick that up and get some rest.  ED precautions again reviewed with patient.   Final Clinical Impressions(s) / UC Diagnoses   Final diagnoses:  Viral illness  Cough  Diarrhea, unspecified type  Nausea     Discharge Instructions      Your vital signs are all reassuring.  You do not have a fever.  Your blood pressure is normal.  It does not significantly drop whenever you change positions which is also good.  Your urine is a little bit concentrated see you are mildly dehydrated but nothing significant.  You do need to increase your rest and fluids and take Pedialyte, vitamin water or some other liquids with electrolytes especially since you have had significant diarrhea.  Take your at home Zofran as needed for the nausea and make sure you are eating.  Take the cough medication that has been prescribed to you by your PCP.  If you start to feel significantly more weak/fatigued or dehydrated he may need to go to the emergency department to have the labs drawn and IV fluids.  COVID test will be back within 6 to 24 hours.  Someone will contact you if positive. If severe abdominal pain or black stools go to ED.   You have received COVID testing today either for positive exposure, concerning symptoms that could be related to COVID infection, screening purposes, or re-testing after confirmed positive.  Your test obtained today checks for active viral infection in the last  1-2 weeks. If your test is negative now, you can still test positive later. So, if you do develop symptoms you should either get re-tested and/or isolate x 5 days and then strict mask use x 5 days (unvaccinated) or mask use x 10 days (vaccinated). Please follow CDC guidelines.  While Rapid antigen tests come back in 15-20 minutes, send out PCR/molecular test results typically come back within 1-3 days. In the mean time, if you are symptomatic, assume this could be a positive test and treat/monitor yourself as if you do have COVID.   We will call with test results if positive. Please download the MyChart app and set up a profile to access test results.   If symptomatic, go home and rest. Push fluids. Take Tylenol as needed for discomfort. Gargle warm salt water. Throat lozenges. Take Mucinex DM or Robitussin for cough. Humidifier in bedroom to ease coughing. Warm showers. Also review the COVID handout for more information.  COVID-19 INFECTION: The incubation period of COVID-19 is approximately 14 days after exposure, with most symptoms developing in roughly 4-5 days. Symptoms may range in severity from mild to critically severe. Roughly 80% of those infected will have mild symptoms. People of any age may become infected with COVID-19 and have the ability to transmit the virus. The most common symptoms include: fever, fatigue, cough, body aches, headaches, sore throat, nasal congestion, shortness of breath, nausea, vomiting, diarrhea, changes in smell and/or taste.    COURSE OF ILLNESS Some patients may begin with mild disease which can progress quickly into critical symptoms. If your symptoms are worsening please call ahead to the Emergency Department and proceed there for further treatment. Recovery time appears to be roughly 1-2 weeks for mild symptoms and 3-6 weeks for severe disease.   GO IMMEDIATELY TO ER FOR FEVER YOU ARE UNABLE TO GET DOWN WITH TYLENOL, BREATHING PROBLEMS, CHEST PAIN, FATIGUE,  LETHARGY, INABILITY TO EAT OR DRINK, ETC  QUARANTINE AND ISOLATION: To help decrease the spread of COVID-19 please remain isolated if you have COVID infection or are highly suspected to have COVID infection. This means -stay home and isolate to one room in the home if you live with others. Do not share a bed or bathroom with others while ill, sanitize and wipe down all countertops and keep common areas clean and disinfected. Stay home for 5 days. If you have  no symptoms or your symptoms are resolving after 5 days, you can leave your house. Continue to wear a mask around others for 5 additional days. If you have been in close contact (within 6 feet) of someone diagnosed with COVID 19, you are advised to quarantine in your home for 14 days as symptoms can develop anywhere from 2-14 days after exposure to the virus. If you develop symptoms, you  must isolate.  Most current guidelines for COVID after exposure -unvaccinated: isolate 5 days and strict mask use x 5 days. Test on day 5 is possible -vaccinated: wear mask x 10 days if symptoms do not develop -You do not necessarily need to be tested for COVID if you have + exposure and  develop symptoms. Just isolate at home x10 days from symptom onset During this global pandemic, CDC advises to practice social distancing, try to stay at least 23f away from others at all times. Wear a face covering. Wash and sanitize your hands regularly and avoid going anywhere that is not necessary.  KEEP IN MIND THAT THE COVID TEST IS NOT 100% ACCURATE AND YOU SHOULD STILL DO EVERYTHING TO PREVENT POTENTIAL SPREAD OF VIRUS TO OTHERS (WEAR MASK, WEAR GLOVES, WGoldenHANDS AND SANITIZE REGULARLY). IF INITIAL TEST IS NEGATIVE, THIS MAY NOT MEAN YOU ARE DEFINITELY NEGATIVE. MOST ACCURATE TESTING IS DONE 5-7 DAYS AFTER EXPOSURE.   It is not advised by CDC to get re-tested after receiving a positive COVID test since you can still test positive for weeks to months after you have already  cleared the virus.   *If you have not been vaccinated for COVID, I strongly suggest you consider getting vaccinated as long as there are no contraindications.       ED Prescriptions   None    PDMP not reviewed this encounter.   EDanton Clap PA-C 08/17/20 [id number]

## 2020-08-16 NOTE — Telephone Encounter (Signed)
Noted  

## 2020-08-16 NOTE — Telephone Encounter (Signed)
urse Assessment Nurse: Percell Miller, RN, Sherlie Ban Date/Time Eilene Ghazi Time): 08/15/2020 6:50:44 PM Confirm and document reason for call. If symptomatic, describe symptoms. ---Caller states she had a virtual visit today with Silvestre Mesi. Caller states she was supposed to have 2 prescriptions sent to her pharmacy. Caller states her husband is at the pharmacy and they have not received them. Caller states her pharmacy will be closing soon. States medications were for her cough, one was tessalon perle and the other was something with codeine in it. Does the patient have any new or worsening symptoms? ---Yes Will a triage be completed? ---No Select reason for no triage. ---Patient declined Please document clinical information provided and list any resource used. ---Informed pt unable to contact on call after hours for medications but a note will be sent over to the office so they can see in the morning where she called in. Informed pt if medications still not at the pharmacy in the morning then to call this office back, office number provided to pt. Nurse: Percell Miller, RN, Sherlie Ban Date/Time (Eastern Time): 08/15/2020 6:53:34 PM Please select the assessment type ---RX called in but not at pharm PLEASE NOTE: All timestamps contained within this report are represented as [place] Standard Time. CONFIDENTIALTY NOTICE: This fax transmission is intended only for the addressee. It contains information that is legally privileged, confidential or otherwise protected from use or disclosure. If you are not the intended recipient, you are strictly prohibited from reviewing, disclosing, copying using or disseminating any of this information or taking any action in reliance on or regarding this information. If you have received this fax in error, please notify us immediately by telephone so that we can arrange for its return to [place]. Phone: [phone], Toll-Free: [phone], Fax: [phone] Page: 2 of 2 Call Id:  [id number] Nurse Assessment Additional Documentation ---Pt states she had a virtual visit with Dr. Lorelei Pont today and medications for her cough were to be sent to her pharmacy but they are not there. Medications were tessalon perle and something with codeine. Informed pt unable to contact on call after hours for medications but a note will be sent to office. Informed pt if medications were still not at pharmacy in the morning to call this office back in the morning, office number provided. Document the name of the medication. ---tessalon perle and cough medication with codeine Has the office closed within the last 30 minutes? ---No Does the client directives allow for assistance with medications after hours? ---No Disp. Time Eilene Ghazi Time) Disposition Final User 08/15/2020 6:55:24 PM Clinical Call Yes Percell Miller, RN, Sherlie Ban

## 2020-08-16 NOTE — Telephone Encounter (Signed)
Medication was sent to pharmacy yesterday.

## 2020-08-16 NOTE — Telephone Encounter (Signed)
Agree with in person evaluation to better know how to treat.

## 2020-08-16 NOTE — ED Triage Notes (Signed)
Pt c/o diarrhea, cough. Started about 4 days ago. She states she has had dark stools the last 2 days. She states she was tested for covid through her employer and was negative she is suppose to retest tomorrow.

## 2020-08-17 ENCOUNTER — Telehealth: Payer: Self-pay | Admitting: Physician Assistant

## 2020-08-17 DIAGNOSIS — U071 COVID-19: Secondary | ICD-10-CM

## 2020-08-17 LAB — SARS CORONAVIRUS 2 (TAT 6-24 HRS): SARS Coronavirus 2: POSITIVE — AB

## 2020-08-17 MED ORDER — MOLNUPIRAVIR EUA 200MG CAPSULE: 4.0000 | ORAL_CAPSULE | Freq: Two times a day (BID) | ORAL | 0 refills | Status: AC

## 2020-08-17 NOTE — Telephone Encounter (Signed)
Patient test positive for COVID-19.  Treating her with antiviral medication.  Sent to pharmacy.  Meets criteria due to age and other comorbidities.

## 2020-09-02 ENCOUNTER — Other Ambulatory Visit: Payer: Self-pay | Admitting: Internal Medicine

## 2020-09-02 ENCOUNTER — Other Ambulatory Visit: Payer: Self-pay

## 2020-09-02 MED ORDER — TRIAMTERENE-HCTZ 37.5-25 MG PO CAPS
1.0000 | ORAL_CAPSULE | Freq: Every day | ORAL | 1 refills | Status: DC
  Filled 2020-09-02: qty 90, 90d supply, fill #0
  Filled 2020-11-25: qty 90, 90d supply, fill #1

## 2020-09-02 MED ORDER — ALLOPURINOL 100 MG PO TABS
ORAL_TABLET | Freq: Every day | ORAL | 3 refills | Status: DC
  Filled 2020-09-02: qty 90, 90d supply, fill #0
  Filled 2020-11-25: qty 90, 90d supply, fill #1
  Filled 2021-01-28: qty 90, 90d supply, fill #2
  Filled 2021-05-22: qty 30, 30d supply, fill #3
  Filled 2021-07-01: qty 30, 30d supply, fill #4
  Filled 2021-07-28: qty 30, 30d supply, fill #5

## 2020-09-02 MED ORDER — LANSOPRAZOLE 30 MG PO CPDR
DELAYED_RELEASE_CAPSULE | ORAL | 0 refills | Status: DC
  Filled 2020-09-02: qty 90, 90d supply, fill #0

## 2020-09-03 ENCOUNTER — Other Ambulatory Visit: Payer: Self-pay

## 2020-09-04 ENCOUNTER — Other Ambulatory Visit: Payer: Self-pay

## 2020-09-04 MED ORDER — CARESTART COVID-19 HOME TEST VI KIT
PACK | 0 refills | Status: DC
  Filled 2020-09-04: qty 2, 4d supply, fill #0

## 2020-09-05 DIAGNOSIS — H52223 Regular astigmatism, bilateral: Secondary | ICD-10-CM | POA: Diagnosis not present

## 2020-09-16 ENCOUNTER — Other Ambulatory Visit: Payer: Self-pay

## 2020-09-16 MED FILL — Levothyroxine Sodium Tab 100 MCG: ORAL | 90 days supply | Qty: 90 | Fill #1 | Status: AC

## 2020-09-17 ENCOUNTER — Encounter: Payer: Self-pay | Admitting: Dermatology

## 2020-09-17 ENCOUNTER — Other Ambulatory Visit: Payer: Self-pay

## 2020-09-17 ENCOUNTER — Ambulatory Visit (INDEPENDENT_AMBULATORY_CARE_PROVIDER_SITE_OTHER): Payer: 59 | Admitting: Dermatology

## 2020-09-17 DIAGNOSIS — D229 Melanocytic nevi, unspecified: Secondary | ICD-10-CM

## 2020-09-17 DIAGNOSIS — Z86018 Personal history of other benign neoplasm: Secondary | ICD-10-CM | POA: Diagnosis not present

## 2020-09-17 DIAGNOSIS — L958 Other vasculitis limited to the skin: Secondary | ICD-10-CM

## 2020-09-17 DIAGNOSIS — L918 Other hypertrophic disorders of the skin: Secondary | ICD-10-CM

## 2020-09-17 DIAGNOSIS — L821 Other seborrheic keratosis: Secondary | ICD-10-CM | POA: Diagnosis not present

## 2020-09-17 DIAGNOSIS — L570 Actinic keratosis: Secondary | ICD-10-CM

## 2020-09-17 DIAGNOSIS — L578 Other skin changes due to chronic exposure to nonionizing radiation: Secondary | ICD-10-CM | POA: Diagnosis not present

## 2020-09-17 DIAGNOSIS — Z1283 Encounter for screening for malignant neoplasm of skin: Secondary | ICD-10-CM | POA: Diagnosis not present

## 2020-09-17 DIAGNOSIS — L814 Other melanin hyperpigmentation: Secondary | ICD-10-CM | POA: Diagnosis not present

## 2020-09-17 DIAGNOSIS — L817 Pigmented purpuric dermatosis: Secondary | ICD-10-CM

## 2020-09-17 DIAGNOSIS — L853 Xerosis cutis: Secondary | ICD-10-CM

## 2020-09-17 DIAGNOSIS — L820 Inflamed seborrheic keratosis: Secondary | ICD-10-CM

## 2020-09-17 DIAGNOSIS — D1800 Hemangioma unspecified site: Secondary | ICD-10-CM

## 2020-09-17 MED ORDER — MOMETASONE FUROATE 0.1 % EX CREA
TOPICAL_CREAM | CUTANEOUS | 0 refills | Status: DC
  Filled 2020-09-17: qty 45, 14d supply, fill #0

## 2020-09-17 NOTE — Patient Instructions (Signed)

## 2020-09-17 NOTE — Progress Notes (Signed)
Follow-Up Visit   Subjective  Marie Colon is a 65 y.o. female who presents for the following: Annual Exam (Hx dysplastic nevus - rash on the lower legs x 2 weeks. Patient tried OTC neosporin and lotion. ). The patient presents for Total-Body Skin Exam (TBSE) for skin cancer screening and mole check.  The following portions of the chart were reviewed this encounter and updated as appropriate:   Tobacco  Allergies  Meds  Problems  Med Hx  Surg Hx  Fam Hx     Review of Systems:  No other skin or systemic complaints except as noted in HPI or Assessment and Plan.  Objective  Well appearing patient in no apparent distress; mood and affect are within normal limits.  A full examination was performed including scalp, head, eyes, ears, nose, lips, neck, chest, axillae, abdomen, back, buttocks, bilateral upper extremities, bilateral lower extremities, hands, feet, fingers, toes, fingernails, and toenails. All findings within normal limits unless otherwise noted below.   Assessment & Plan  Schamberg's purpura B/L leg With benign exertional vasculitis -typically associated with heat and being on the feet and exertion.  She has had these elements prior to this occurring Start Mometasone 0.1% cream to aa's QD-BID until resolved.   Topical steroids (such as triamcinolone, fluocinolone, fluocinonide, mometasone, clobetasol, halobetasol, betamethasone, hydrocortisone) can cause thinning and lightening of the skin if they are used for too long in the same area. Your physician has selected the right strength medicine for your problem and area affected on the body. Please use your medication only as directed by your physician to prevent side effects.   mometasone (ELOCON) 0.1 % cream - B/L leg Apply to affected areas/rash on lower legs once to twice a day.  Avoid face, groin, and axilla.  AK (actinic keratosis) Forehead x 1 Destruction of lesion - Forehead x 1 Complexity: simple    Destruction method: cryotherapy   Informed consent: discussed and consent obtained   Timeout:  patient name, date of birth, surgical site, and procedure verified Lesion destroyed using liquid nitrogen: Yes   Region frozen until ice ball extended beyond lesion: Yes   Outcome: patient tolerated procedure well with no complications   Post-procedure details: wound care instructions given    Inflamed seborrheic keratosis Mid vertex scalp Destruction of lesion - Mid vertex scalp Complexity: simple   Destruction method: cryotherapy   Informed consent: discussed and consent obtained   Timeout:  patient name, date of birth, surgical site, and procedure verified Lesion destroyed using liquid nitrogen: Yes   Region frozen until ice ball extended beyond lesion: Yes   Outcome: patient tolerated procedure well with no complications   Post-procedure details: wound care instructions given    Lentigines - Scattered tan macules - Due to sun exposure - Benign-appering, observe - Recommend daily broad spectrum sunscreen SPF 30+ to sun-exposed areas, reapply every 2 hours as needed. - Call for any changes  Seborrheic Keratoses - Stuck-on, waxy, tan-brown papules and/or plaques  - Benign-appearing - Discussed benign etiology and prognosis. - Observe - Call for any changes  Melanocytic Nevi - Tan-brown and/or pink-flesh-colored symmetric macules and papules - Benign appearing on exam today - Observation - Call clinic for new or changing moles - Recommend daily use of broad spectrum spf 30+ sunscreen to sun-exposed areas.   Hemangiomas - Red papules - Discussed benign nature - Observe - Call for any changes  Actinic Damage - Chronic condition, secondary to cumulative UV/sun exposure - diffuse scaly  erythematous macules with underlying dyspigmentation - Recommend daily broad spectrum sunscreen SPF 30+ to sun-exposed areas, reapply every 2 hours as needed.  - Staying in the shade or  wearing long sleeves, sun glasses (UVA+UVB protection) and wide brim hats (4-inch brim around the entire circumference of the hat) are also recommended for sun protection.  - Call for new or changing lesions.  Xerosis - diffuse xerotic patches - recommend gentle, hydrating skin care - gentle skin care handout given  Acrochordons (Skin Tags) - Fleshy, skin-colored pedunculated papules - Benign appearing.  - Observe. - If desired, they can be removed with an in office procedure that is not covered by insurance. - Please call the clinic if you notice any new or changing lesions.  Skin cancer screening performed today.  Return in about 1 year (around 09/17/2021) for TBSE; 3 mths AK and ISK follow up .  Luther Redo, CMA, am acting as scribe for Sarina Ser, MD .  Documentation: I have reviewed the above documentation for accuracy and completeness, and I agree with the above.  Sarina Ser, MD

## 2020-09-26 ENCOUNTER — Telehealth: Payer: Self-pay | Admitting: Internal Medicine

## 2020-09-26 DIAGNOSIS — E7800 Pure hypercholesterolemia, unspecified: Secondary | ICD-10-CM

## 2020-09-26 DIAGNOSIS — I10 Essential (primary) hypertension: Secondary | ICD-10-CM

## 2020-09-26 DIAGNOSIS — R739 Hyperglycemia, unspecified: Secondary | ICD-10-CM

## 2020-09-26 NOTE — Telephone Encounter (Signed)
[facility] orders placed. LM for patient.

## 2020-09-26 NOTE — Telephone Encounter (Signed)
Patient called in stating that she saw Dr.Scott in April and that she went to [facility] today and they do not have orders in for her labs.Please add the orders and call her when it is ready.

## 2020-09-27 DIAGNOSIS — I10 Essential (primary) hypertension: Secondary | ICD-10-CM | POA: Diagnosis not present

## 2020-09-27 DIAGNOSIS — E7800 Pure hypercholesterolemia, unspecified: Secondary | ICD-10-CM | POA: Diagnosis not present

## 2020-09-27 DIAGNOSIS — R739 Hyperglycemia, unspecified: Secondary | ICD-10-CM | POA: Diagnosis not present

## 2020-09-28 LAB — CBC WITH DIFFERENTIAL/PLATELET
Basophils Absolute: 0 10*3/uL (ref 0.0–0.2)
Basos: 1 %
EOS (ABSOLUTE): 0.1 10*3/uL (ref 0.0–0.4)
Eos: 2 %
Hematocrit: 38.3 % (ref 34.0–46.6)
Hemoglobin: 13.3 g/dL (ref 11.1–15.9)
Immature Grans (Abs): 0 10*3/uL (ref 0.0–0.1)
Immature Granulocytes: 1 %
Lymphocytes Absolute: 1.7 10*3/uL (ref 0.7–3.1)
Lymphs: 26 %
MCH: 30.5 pg (ref 26.6–33.0)
MCHC: 34.7 g/dL (ref 31.5–35.7)
MCV: 88 fL (ref 79–97)
Monocytes Absolute: 0.5 10*3/uL (ref 0.1–0.9)
Monocytes: 8 %
Neutrophils Absolute: 4.2 10*3/uL (ref 1.4–7.0)
Neutrophils: 62 %
Platelets: 188 10*3/uL (ref 150–450)
RBC: 4.36 x10E6/uL (ref 3.77–5.28)
RDW: 13.4 % (ref 11.7–15.4)
WBC: 6.6 10*3/uL (ref 3.4–10.8)

## 2020-09-28 LAB — LIPID PANEL
Chol/HDL Ratio: 4.2 ratio (ref 0.0–4.4)
Cholesterol, Total: 178 mg/dL (ref 100–199)
HDL: 42 mg/dL (ref >39)
LDL Chol Calc (NIH): 107 mg/dL — ABNORMAL HIGH (ref 0–99)
Triglycerides: 163 mg/dL — ABNORMAL HIGH (ref 0–149)
VLDL Cholesterol Cal: 29 mg/dL (ref 5–40)

## 2020-09-28 LAB — HEMOGLOBIN A1C
Est. average glucose Bld gHb Est-mCnc: 126 mg/dL
Hgb A1c MFr Bld: 6 % — ABNORMAL HIGH (ref 4.8–5.6)

## 2020-09-28 LAB — BASIC METABOLIC PANEL
BUN/Creatinine Ratio: 13 (ref 12–28)
BUN: 13 mg/dL (ref 8–27)
CO2: 28 mmol/L (ref 20–29)
Calcium: 9.4 mg/dL (ref 8.7–10.3)
Chloride: 101 mmol/L (ref 96–106)
Creatinine, Ser: 1.04 mg/dL — ABNORMAL HIGH (ref 0.57–1.00)
Glucose: 98 mg/dL (ref 65–99)
Potassium: 4 mmol/L (ref 3.5–5.2)
Sodium: 141 mmol/L (ref 134–144)
eGFR: 60 mL/min/{1.73_m2} (ref >59)

## 2020-09-28 LAB — TSH: TSH: 3.35 u[IU]/mL (ref 0.450–4.500)

## 2020-09-28 LAB — HEPATIC FUNCTION PANEL
ALT: 57 IU/L — ABNORMAL HIGH (ref 0–32)
AST: 43 IU/L — ABNORMAL HIGH (ref 0–40)
Albumin: 4.2 g/dL (ref 3.8–4.8)
Alkaline Phosphatase: 81 IU/L (ref 44–121)
Bilirubin Total: 0.4 mg/dL (ref 0.0–1.2)
Bilirubin, Direct: 0.14 mg/dL (ref 0.00–0.40)
Total Protein: 6.2 g/dL (ref 6.0–8.5)

## 2020-10-08 ENCOUNTER — Other Ambulatory Visit: Payer: Self-pay

## 2020-10-08 ENCOUNTER — Ambulatory Visit: Payer: 59 | Admitting: Internal Medicine

## 2020-10-08 VITALS — BP 120/74 | HR 74 | Temp 97.9°F | Resp 16 | Ht 63.0 in | Wt 171.8 lb

## 2020-10-08 DIAGNOSIS — Z83719 Family history of colon polyps, unspecified: Secondary | ICD-10-CM

## 2020-10-08 DIAGNOSIS — Z8371 Family history of colonic polyps: Secondary | ICD-10-CM

## 2020-10-08 DIAGNOSIS — E7800 Pure hypercholesterolemia, unspecified: Secondary | ICD-10-CM | POA: Diagnosis not present

## 2020-10-08 DIAGNOSIS — E039 Hypothyroidism, unspecified: Secondary | ICD-10-CM

## 2020-10-08 DIAGNOSIS — R739 Hyperglycemia, unspecified: Secondary | ICD-10-CM | POA: Diagnosis not present

## 2020-10-08 DIAGNOSIS — D352 Benign neoplasm of pituitary gland: Secondary | ICD-10-CM | POA: Diagnosis not present

## 2020-10-08 DIAGNOSIS — I10 Essential (primary) hypertension: Secondary | ICD-10-CM | POA: Diagnosis not present

## 2020-10-08 DIAGNOSIS — Z114 Encounter for screening for human immunodeficiency virus [HIV]: Secondary | ICD-10-CM | POA: Diagnosis not present

## 2020-10-08 DIAGNOSIS — K760 Fatty (change of) liver, not elsewhere classified: Secondary | ICD-10-CM | POA: Diagnosis not present

## 2020-10-08 DIAGNOSIS — K921 Melena: Secondary | ICD-10-CM | POA: Insufficient documentation

## 2020-10-08 NOTE — Progress Notes (Signed)
Patient ID: Marie Colon, female   DOB: [birth year], 65 y.o.   MRN: [id number]   Subjective:    Patient ID: Marie Colon, female    DOB: 1955-12-28, 65 y.o.   MRN: [id number]  This visit occurred during the SARS-CoV-2 public health emergency.  Safety protocols were in place, including screening questions prior to the visit, additional usage of staff PPE, and extensive cleaning of exam room while observing appropriate contact time as indicated for disinfecting solutions.   Patient here for a scheduled follow up.   Chief Complaint  Patient presents with   Hyperlipidemia   Hypothyroidism   Hypertension   .   Hyperlipidemia Pertinent negatives include no chest pain, myalgias or shortness of breath.  Hypertension Pertinent negatives include no chest pain, headaches, palpitations or shortness of breath.   Diagnosed with covid 08/16/20.  Treated with oral antiviral.  She is doing better.  Has what sounds like a tickle in her throat occasionally with some cough, but overall doing much better.  No chest pain, tightness or shortness of breath.  No nausea or vomiting.  She is eating and drinking well.  Occasional loose stool but no persistent diarrhea.  When she was sick, she noticed black stool.  On questioning her she did have Pepto-Bismol prior.  We discussed Hemoccult cards.  Has not noticed any black stool or bloody stool since.  No abdominal pain.  Some fatigue.  This is improving as well.  Overall appears to be handling stress.  Planning on possibly retiring in the next several months.  She is going out of town.  Elects to postpone flu vaccine and pneumonia vaccine.   Past Medical History:  Diagnosis Date   Acromegaly ([place])    Actinic keratosis    Angiomyolipoma of kidney    evaluated by Dr Bernardo Heater   Fatty liver    GERD (gastroesophageal reflux disease)    Heart murmur    per Dr. Einar Pheasant   History of pyelonephritis    History of ulcerative colitis    Hx of dysplastic nevus  04/21/2016   L upper back, moderate to severe atypia   Hypercholesterolemia    Hypertension    Hypothyroidism    Pituitary macroadenoma ([place])    s/p removal (elevated growth hormone)   Tubal pregnancy    s/p rupture   Wears glasses    Past Surgical History:  Procedure Laterality Date   ABDOMINAL HYSTERECTOMY  2003   abnormal uterine bleeding   APPENDECTOMY     CHOLECYSTECTOMY  2005   COLONOSCOPY WITH PROPOFOL N/A 03/18/2017   Procedure: COLONOSCOPY WITH PROPOFOL;  Surgeon: Lollie Sails, MD;  Location: [facility] END[facility]COPY;  Service: Endoscopy;  Laterality: N/A;   DILATION AND CURETTAGE OF UTERUS     MASS EXCISION Right 08/29/2015   Procedure: EXCISION OF RIGHT THIGH MASS SUBFASICAL 15 CM;  Surgeon: Stark Klein, MD;  Location: [facility];  Service: General;  Laterality: Right;   pituitary tumor removal  2006   Family History  Problem Relation Age of Onset   Brain cancer Father        died - 75   Diabetes Mother    Hypothyroidism Sister    Breast cancer Sister 60   Sjogren's syndrome Sister    Breast cancer Sister 24   Diabetes Other        grandmother   Social History   Socioeconomic History   Marital status: Married    Spouse name: Not on file  Number of children: 2   Years of education: Not on file   Highest education level: Not on file  Occupational History    Employer: [employer]    Comment: [employer]  Tobacco Use   Smoking status: Never   Smokeless tobacco: Never  Vaping Use   Vaping Use: Never used  Substance and Sexual Activity   Alcohol use: No    Alcohol/week: 0.0 standard drinks   Drug use: No   Sexual activity: Not on file  Other Topics Concern   Not on file  Social History Narrative   Not on file   Social Determinants of Health   Financial Resource Strain: Not on file  Food Insecurity: Not on file  Transportation Needs: Not on file  Physical Activity: Not on file  Stress: Not on file  Social Connections: Not on file    Review of Systems   Constitutional:  Positive for fatigue. Negative for appetite change and unexpected weight change.  HENT:  Negative for congestion and sinus pressure.   Respiratory:  Negative for chest tightness and shortness of breath.        Minimal cough  Cardiovascular:  Negative for chest pain and palpitations.  Gastrointestinal:  Negative for abdominal pain, nausea and vomiting.  Genitourinary:  Negative for difficulty urinating and dysuria.  Musculoskeletal:  Negative for joint swelling and myalgias.  Skin:  Negative for color change and rash.  Neurological:  Negative for dizziness, light-headedness and headaches.  Psychiatric/Behavioral:  Negative for agitation and dysphoric mood.       Objective:     BP 120/74   Pulse 74   Temp 97.9 F (36.6 C)   Resp 16   Ht _0  (1.6 m)   Wt 171 lb 12.8 oz (77.9 kg)   SpO2 98%   BMI 30.43 kg/m  Wt Readings from Last 3 Encounters:  10/08/20 171 lb 12.8 oz (77.9 kg)  08/16/20 169 lb 1.5 oz (76.7 kg)  06/03/20 169 lb 3.2 oz (76.7 kg)    Physical Exam Vitals reviewed.  Constitutional:      General: She is not in acute distress.    Appearance: Normal appearance.  HENT:     Head: Normocephalic and atraumatic.     Right Ear: External ear normal.     Left Ear: External ear normal.  Eyes:     General: No scleral icterus.       Right eye: No discharge.        Left eye: No discharge.     Conjunctiva/sclera: Conjunctivae normal.  Neck:     Thyroid: No thyromegaly.  Cardiovascular:     Rate and Rhythm: Normal rate and regular rhythm.  Pulmonary:     Effort: No respiratory distress.     Breath sounds: Normal breath sounds. No wheezing.  Abdominal:     General: Bowel sounds are normal.     Palpations: Abdomen is soft.     Tenderness: There is no abdominal tenderness.  Musculoskeletal:        General: No swelling or tenderness.     Cervical back: Neck supple. No tenderness.  Lymphadenopathy:     Cervical: No cervical adenopathy.  Skin:     Findings: No erythema or rash.  Neurological:     Mental Status: She is alert.  Psychiatric:        Mood and Affect: Mood normal.        Behavior: Behavior normal.     Outpatient Encounter Medications as  of 10/08/2020  Medication Sig   allopurinol (ZYLOPRIM) 100 MG tablet TAKE 1 TABLET BY MOUTH DAILY.   aspirin EC 81 MG tablet Take 81 mg by mouth daily. (Patient not taking: Reported on 09/17/2020)   cabergoline (DOSTINEX) 0.5 MG tablet Take 1/2 tablets (0.25 mg total) by mouth once daily   lansoprazole (PREVACID) 30 MG capsule TAKE 1 CAPSULE BY MOUTH DAILY AT 12 NOON.   levothyroxine (SYNTHROID) 100 MCG tablet TAKE 1 TABLET BY MOUTH DAILY BEFORE BREAKFAST.   metoprolol tartrate (LOPRESSOR) 50 MG tablet TAKE 1 TABLET BY MOUTH TWICE DAILY   METRONIDAZOLE, TOPICAL, 0.75 % LOTN Apply 1 application topically at bedtime. qhs to face for Rosacea   mometasone (ELOCON) 0.1 % cream Apply to affected areas/rash on lower legs once to twice a day.  Avoid face, groin, and axilla.   Multiple Vitamin (MULTIVITAMIN) tablet Take 1 tablet by mouth daily.   pravastatin (PRAVACHOL) 40 MG tablet TAKE 1 TABLET (40 MG TOTAL) BY MOUTH DAILY.   triamterene-hydrochlorothiazide (DYAZIDE) 37.5-25 MG capsule TAKE 1 CAPSULE BY MOUTH DAILY.   [DISCONTINUED] benzonatate (TESSALON) 200 MG capsule Take 1 capsule (200 mg total) by mouth 3 (three) times daily as needed for cough. (Patient not taking: Reported on 09/17/2020)   [DISCONTINUED] COVID-19 At Home Antigen Test [facility] COVID-19 HOME TEST) KIT use as directed within package instructions (Patient not taking: Reported on 09/17/2020)   [DISCONTINUED] ondansetron (ZOFRAN ODT) 8 MG disintegrating tablet Take 1 tablet (8 mg total) by mouth every 8 (eight) hours as needed for nausea or vomiting. (Patient not taking: Reported on 09/17/2020)   [DISCONTINUED] polyethylene glycol powder (GLYCOLAX/MIRALAX) powder Take with your choice of fluid. This is safe to use on a daily basis if  needed. (Patient not taking: Reported on 09/17/2020)   No facility-administered encounter medications on file as of 10/08/2020.     Lab Results  Component Value Date   WBC 6.6 09/27/2020   HGB 13.3 09/27/2020   HCT 38.3 09/27/2020   PLT 188 09/27/2020   GLUCOSE 98 09/27/2020   CHOL 178 09/27/2020   TRIG 163 (H) 09/27/2020   HDL 42 09/27/2020   LDLCALC 107 (H) 09/27/2020   ALT 57 (H) 09/27/2020   AST 43 (H) 09/27/2020   NA 141 09/27/2020   K 4.0 09/27/2020   CL 101 09/27/2020   CREATININE 1.04 (H) 09/27/2020   BUN 13 09/27/2020   CO2 28 09/27/2020   TSH 3.350 09/27/2020   HGBA1C 6.0 (H) 09/27/2020       Assessment & Plan:   Problem List Items Addressed This Visit     Black stool    Noticed when she had covid after taking pepto bismol.  Will obtain hemoccult cards.  No further bleeding or black stools.  Last colonoscopy 2019.        Relevant Orders   Fecal occult blood, imunochemical   Family history of colonic polyps    Colonoscopy 08/2017 - one polyp ascending colon.        Fatty liver    Diet and exercise.  Has seen GI.  Follow liver function tests.        Hypercholesteremia - Primary    On pravastatin.  Low cholesterol diet and exercise.  Follow lipid panel and liver function tests.       Relevant Orders   Hepatic function panel   Lipid panel   Hyperglycemia    Low carb diet and exercise.  Follow met b and a1c.  Relevant Orders   Hemoglobin A1c   Hypertension    Continue metoprolol and triam/hctz.  Blood pressure as outlined.  No changes in medication.  Follow pressures.  Follow metabolic panel.       Relevant Orders   Basic metabolic panel   Hypothyroidism    On thyroid replacement.  Follow tsh.       Pituitary macroadenoma ([facility])    Previous - elevated GH - acromegaly.  Followed by Dr Gabriel Carina.  On cabergoline.  Recommended f/u in year.       Other Visit Diagnoses     Screening for HIV without presence of risk factors       Relevant  Orders   HIV Antibody (routine testing w rflx)        Einar Pheasant, MD

## 2020-10-13 ENCOUNTER — Encounter: Payer: Self-pay | Admitting: Internal Medicine

## 2020-10-13 NOTE — Assessment & Plan Note (Signed)
On thyroid replacement.  Follow tsh.  

## 2020-10-13 NOTE — Assessment & Plan Note (Signed)
Continue metoprolol and triam/hctz.  Blood pressure as outlined.  No changes in medication.  Follow pressures.  Follow metabolic panel.

## 2020-10-13 NOTE — Assessment & Plan Note (Signed)
Diet and exercise.  Has seen GI. Follow liver function tests.   

## 2020-10-13 NOTE — Assessment & Plan Note (Signed)
Previous - elevated GH - acromegaly.  Followed by Dr Solum.  On cabergoline.  Recommended f/u in year.  

## 2020-10-14 ENCOUNTER — Encounter: Payer: Self-pay | Admitting: Internal Medicine

## 2020-10-14 NOTE — Assessment & Plan Note (Signed)
On pravastatin.  Low cholesterol diet and exercise.  Follow lipid panel and liver function tests.   

## 2020-10-14 NOTE — Assessment & Plan Note (Signed)
Colonoscopy 08/2017 - one polyp ascending colon.   

## 2020-10-14 NOTE — Assessment & Plan Note (Signed)
Low carb diet and exercise.  Follow met b and a1c.

## 2020-10-14 NOTE — Assessment & Plan Note (Signed)
Noticed when she had covid after taking pepto bismol.  Will obtain hemoccult cards.  No further bleeding or black stools.  Last colonoscopy 2019.

## 2020-10-29 DIAGNOSIS — E039 Hypothyroidism, unspecified: Secondary | ICD-10-CM | POA: Diagnosis not present

## 2020-10-29 DIAGNOSIS — R7303 Prediabetes: Secondary | ICD-10-CM | POA: Diagnosis not present

## 2020-10-29 DIAGNOSIS — E220 Acromegaly and pituitary gigantism: Secondary | ICD-10-CM | POA: Diagnosis not present

## 2020-11-18 ENCOUNTER — Other Ambulatory Visit: Payer: Self-pay

## 2020-11-18 DIAGNOSIS — R7303 Prediabetes: Secondary | ICD-10-CM | POA: Diagnosis not present

## 2020-11-18 DIAGNOSIS — Z87898 Personal history of other specified conditions: Secondary | ICD-10-CM | POA: Diagnosis not present

## 2020-11-18 DIAGNOSIS — E039 Hypothyroidism, unspecified: Secondary | ICD-10-CM | POA: Diagnosis not present

## 2020-11-18 DIAGNOSIS — E220 Acromegaly and pituitary gigantism: Secondary | ICD-10-CM | POA: Diagnosis not present

## 2020-11-18 MED ORDER — CABERGOLINE 0.5 MG PO TABS
ORAL_TABLET | ORAL | 3 refills | Status: DC
  Filled 2020-11-18: qty 45, 90d supply, fill #0

## 2020-11-22 ENCOUNTER — Ambulatory Visit: Payer: 59 | Attending: Internal Medicine

## 2020-11-22 ENCOUNTER — Other Ambulatory Visit: Payer: Self-pay

## 2020-11-22 DIAGNOSIS — Z23 Encounter for immunization: Secondary | ICD-10-CM

## 2020-11-22 MED ORDER — PFIZER COVID-19 VAC BIVALENT 30 MCG/0.3ML IM SUSP
INTRAMUSCULAR | 0 refills | Status: DC
  Filled 2020-11-22: qty 0.3, 1d supply, fill #0

## 2020-11-22 NOTE — Progress Notes (Signed)
   Covid-19 Vaccination Clinic  Name:  Marie Colon    MRN: [id number] DOB: 02-02-1955  11/22/2020  [name] was observed post Covid-19 immunization for 30 minutes based on pre-vaccination screening without incident. She was provided with Vaccine Information Sheet and instruction to access the V-Safe system.   [name] was instructed to call 911 with any severe reactions post vaccine: Difficulty breathing  Swelling of face and throat  A fast heartbeat  A bad rash all over body  Dizziness and weakness   Immunizations Administered     Name Date Dose VIS Date Route   Pfizer Covid-19 Vaccine Bivalent Booster 11/22/2020  2:30 PM 0.3 mL 09/25/2020 Intramuscular   Manufacturer: [place]   Lot: [place]: [id number]      Lu Duffel, PharmD, MBA Clinical Acute Care Pharmacist

## 2020-11-25 ENCOUNTER — Other Ambulatory Visit: Payer: Self-pay

## 2020-11-25 ENCOUNTER — Other Ambulatory Visit: Payer: Self-pay | Admitting: Internal Medicine

## 2020-11-25 MED FILL — Pravastatin Sodium Tab 40 MG: ORAL | 90 days supply | Qty: 90 | Fill #0 | Status: AC

## 2020-11-25 MED FILL — Lansoprazole Cap Delayed Release 30 MG: ORAL | 90 days supply | Qty: 90 | Fill #0 | Status: AC

## 2020-11-25 MED FILL — Levothyroxine Sodium Tab 100 MCG: ORAL | 90 days supply | Qty: 90 | Fill #0 | Status: CN

## 2020-11-25 MED FILL — Metoprolol Tartrate Tab 50 MG: ORAL | 90 days supply | Qty: 180 | Fill #0 | Status: AC

## 2020-12-13 DIAGNOSIS — E220 Acromegaly and pituitary gigantism: Secondary | ICD-10-CM | POA: Diagnosis not present

## 2020-12-15 MED FILL — Levothyroxine Sodium Tab 100 MCG: ORAL | 90 days supply | Qty: 90 | Fill #0 | Status: AC

## 2020-12-16 ENCOUNTER — Other Ambulatory Visit: Payer: Self-pay

## 2020-12-17 ENCOUNTER — Other Ambulatory Visit: Payer: Self-pay

## 2020-12-17 MED FILL — Metoprolol Tartrate Tab 50 MG: ORAL | 90 days supply | Qty: 180 | Fill #1 | Status: CN

## 2021-01-01 ENCOUNTER — Ambulatory Visit: Payer: 59 | Admitting: Dermatology

## 2021-01-01 ENCOUNTER — Other Ambulatory Visit: Payer: Self-pay

## 2021-01-01 DIAGNOSIS — L820 Inflamed seborrheic keratosis: Secondary | ICD-10-CM | POA: Diagnosis not present

## 2021-01-01 DIAGNOSIS — L578 Other skin changes due to chronic exposure to nonionizing radiation: Secondary | ICD-10-CM | POA: Diagnosis not present

## 2021-01-01 DIAGNOSIS — L570 Actinic keratosis: Secondary | ICD-10-CM

## 2021-01-01 NOTE — Patient Instructions (Signed)

## 2021-01-01 NOTE — Progress Notes (Signed)
   Follow-Up Visit   Subjective  Marie Colon is a 65 y.o. female who presents for the following: Actinic Keratosis (Forehead, 2m f/u LN2 at last visit, pt has a scaly spot on L face today), ISK f/u (Mid vertex scalp, 6m f/u, pt feels like may have one remaining), and check spots (Bil hands, no symptoms, has started a few months ago).  The following portions of the chart were reviewed this encounter and updated as appropriate:   Tobacco  Allergies  Meds  Problems  Med Hx  Surg Hx  Fam Hx     Review of Systems:  No other skin or systemic complaints except as noted in HPI or Assessment and Plan.  Objective  Well appearing patient in no apparent distress; mood and affect are within normal limits.  A focused examination was performed including face, scalp. Relevant physical exam findings are noted in the Assessment and Plan.  forehead x 4 (4) Pink scaly macules   Scalp x 3, Total = 3 (3) Erythematous keratotic or waxy stuck-on papule or plaque.    Assessment & Plan   Actinic Damage - chronic, secondary to cumulative UV radiation exposure/sun exposure over time - diffuse scaly erythematous macules with underlying dyspigmentation - Recommend daily broad spectrum sunscreen SPF 30+ to sun-exposed areas, reapply every 2 hours as needed.  - Recommend staying in the shade or wearing long sleeves, sun glasses (UVA+UVB protection) and wide brim hats (4-inch brim around the entire circumference of the hat). - Call for new or changing lesions.   AK (actinic keratosis) (4) forehead x 4  Destruction of lesion - forehead x 4 Complexity: simple   Destruction method: cryotherapy   Informed consent: discussed and consent obtained   Timeout:  patient name, date of birth, surgical site, and procedure verified Lesion destroyed using liquid nitrogen: Yes   Region frozen until ice ball extended beyond lesion: Yes   Outcome: patient tolerated procedure well with no complications    Post-procedure details: wound care instructions given    Inflamed seborrheic keratosis Scalp x 3, Total = 3  Destruction of lesion - Scalp x 3, Total = 3 Complexity: simple   Destruction method: cryotherapy   Informed consent: discussed and consent obtained   Timeout:  patient name, date of birth, surgical site, and procedure verified Lesion destroyed using liquid nitrogen: Yes   Region frozen until ice ball extended beyond lesion: Yes   Outcome: patient tolerated procedure well with no complications   Post-procedure details: wound care instructions given    Return for TBSE, Hx of AKs, Hx of Dysplastic nevi.  I, Othelia Pulling, RMA, am acting as scribe for Sarina Ser, MD . Documentation: I have reviewed the above documentation for accuracy and completeness, and I agree with the above.  Sarina Ser, MD

## 2021-01-10 ENCOUNTER — Encounter: Payer: Self-pay | Admitting: Dermatology

## 2021-01-14 IMAGING — US US RENAL
1 series · 14 of 25 positions shown · non-contrast
Comparison: Ultrasound dated 11/26/2016 and 09/11/2014 and CT scan
dated 07/28/2010.

CLINICAL DATA: Decreased glomerular filtration rate.

EXAM:
RENAL / URINARY TRACT ULTRASOUND COMPLETE

[Series 1: us renal · 0.26mm/px · 14 of 47 slices shown]
[im 1/47]
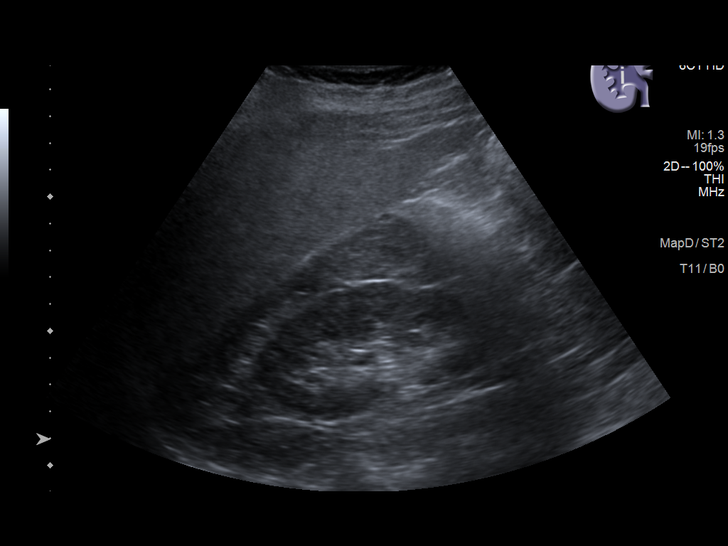
[im 4/47]
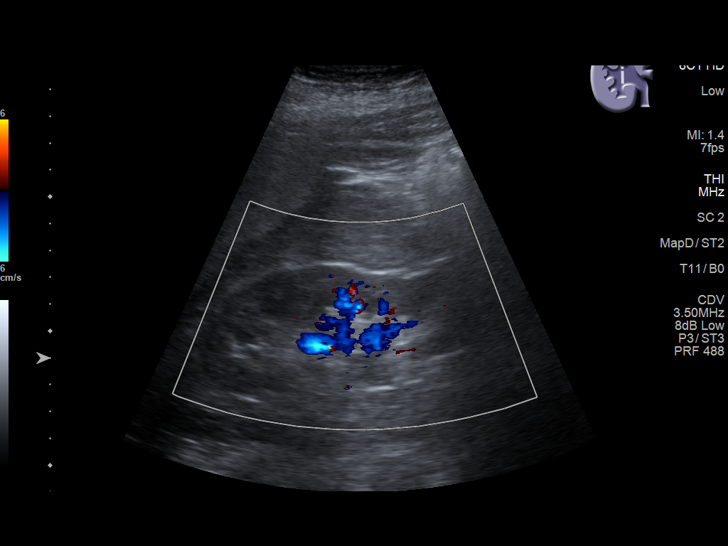
[im 8/47]
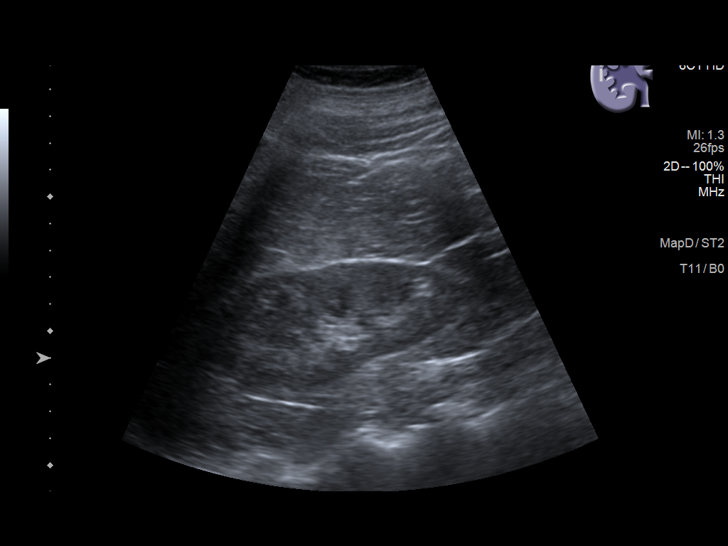
[im 12/47]
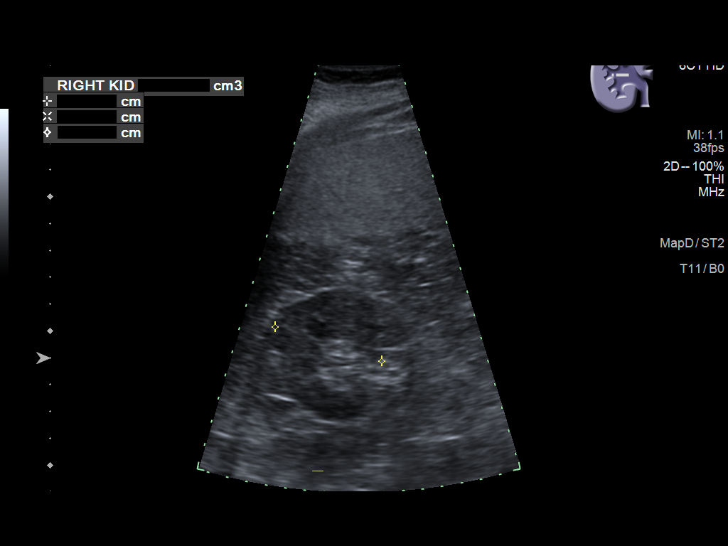
[im 16/47]
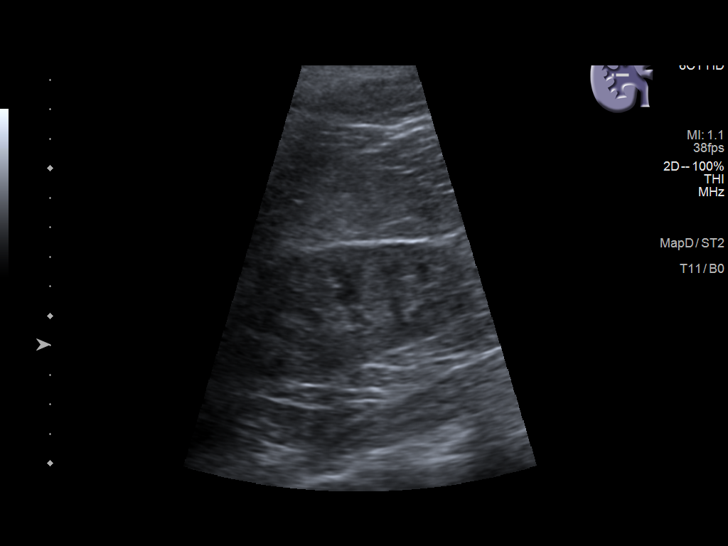
[im 18/47]
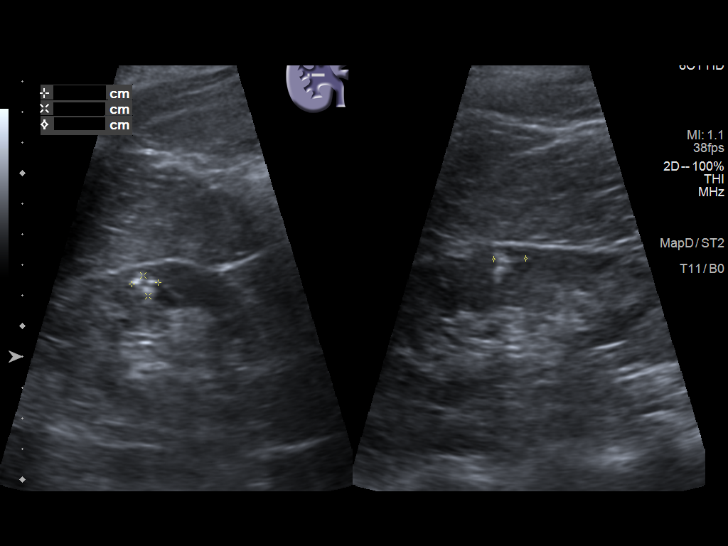
[im 22/47]
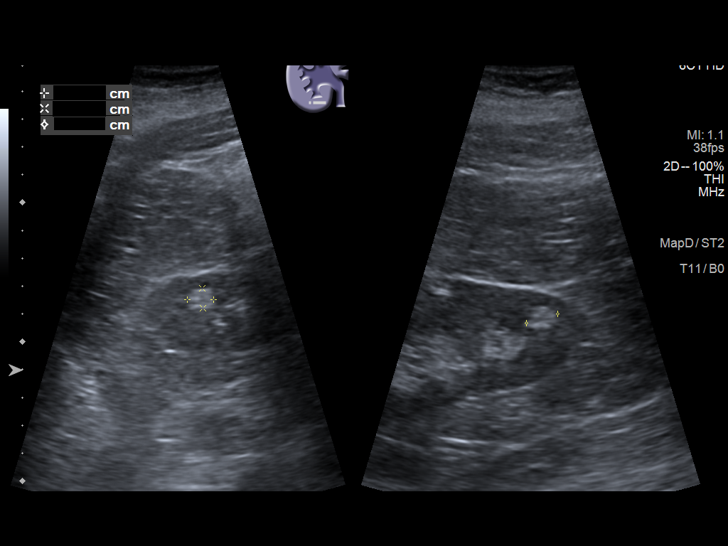
[im 25/47]
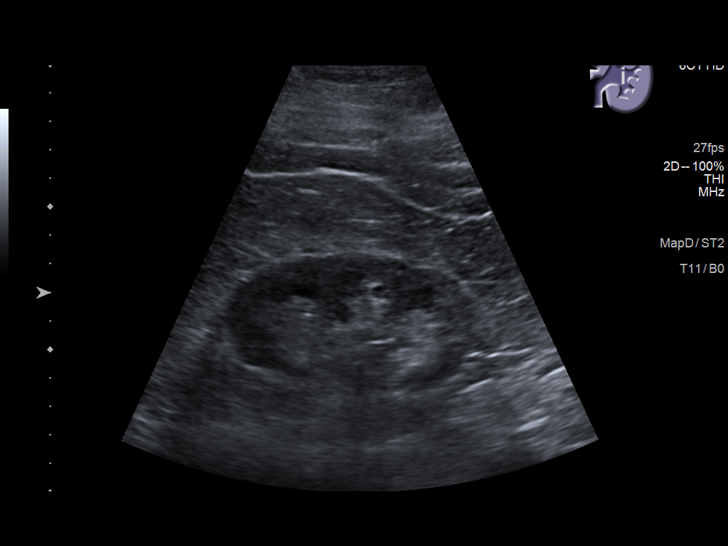
[im 29/47]
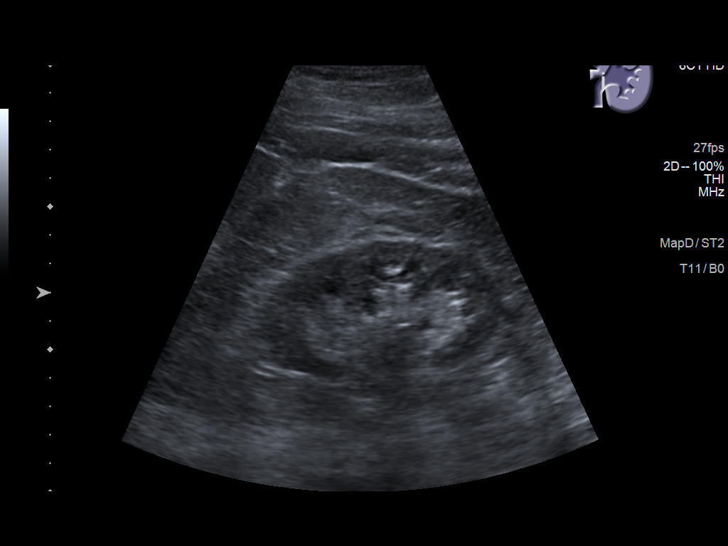
[im 31/47]
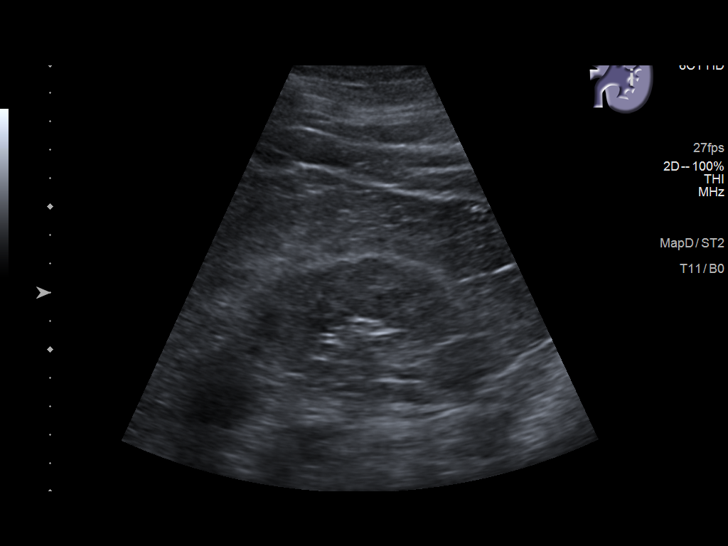
[im 35/47]
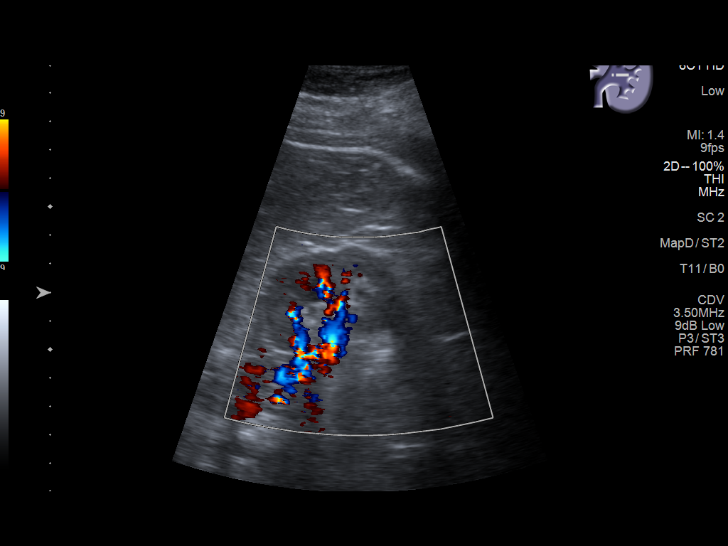
[im 39/47]
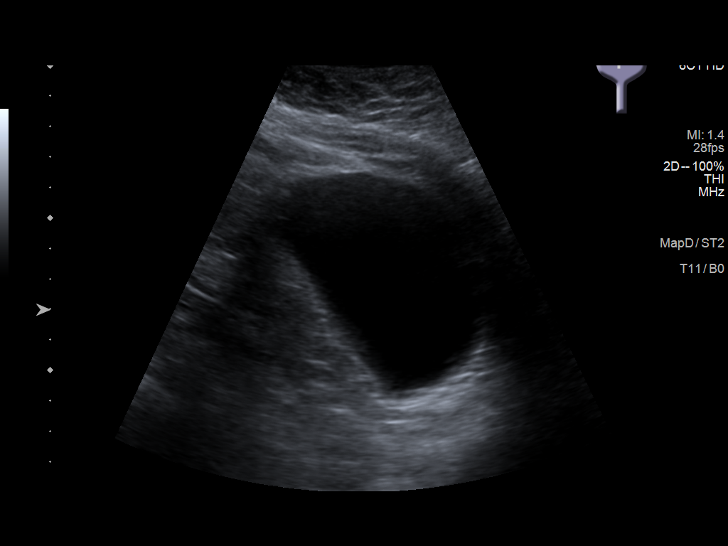
[im 43/47]
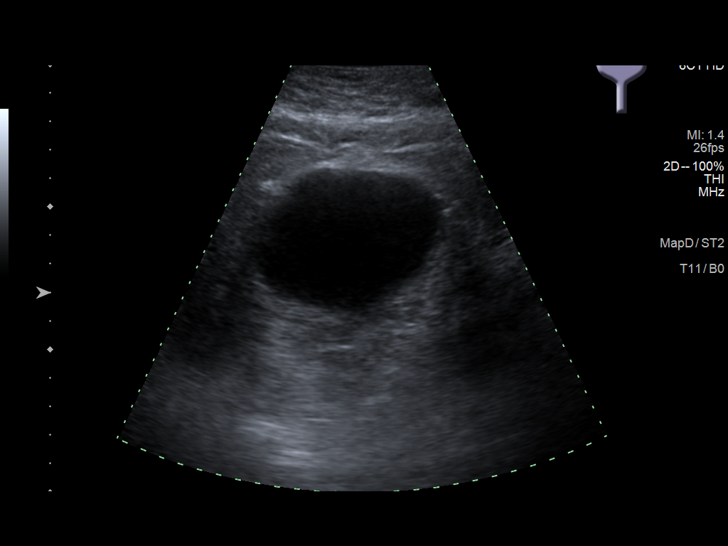
[im 47/47]
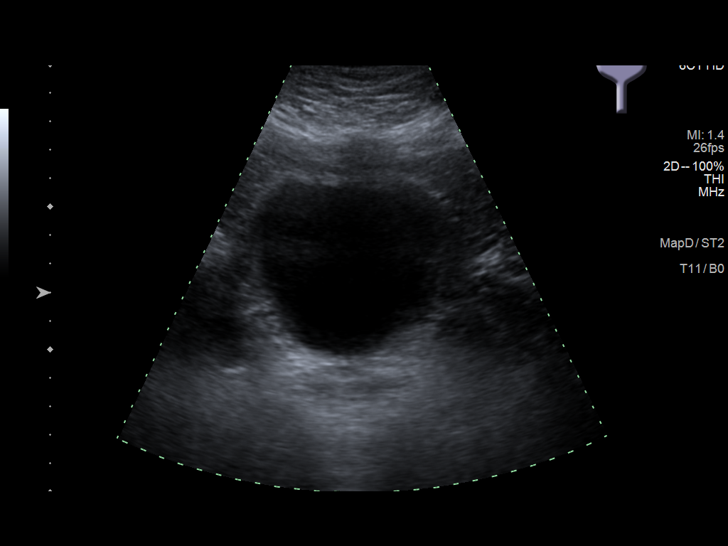

[14 of 25 positions shown; findings below may reference images not displayed]

FINDINGS: Right Kidney:

Renal measurements: 9.7 x 4.4 x 4.2 cm = volume: 94 mL .
Echogenicity within normal limits. 8 mm cyst in the mid right
kidney. 10 mm solid-appearing slightly hyperechoic lesion in the
lower pole of the right kidney, unchanged since multiple prior
studies. No hydronephrosis.

Left Kidney:

Renal measurements: 8.6 x 4.6 x 4.7 cm = volume: 97 mL. Echogenicity
within normal limits. No mass or hydronephrosis visualized.

Bladder:

Appears normal for degree of bladder distention.

Other:

None.
IMPRESSION: No significant abnormality of the kidneys. No hydronephrosis. Stable
small angiomyolipoma in the lower pole of the right kidney.

## 2021-01-28 ENCOUNTER — Other Ambulatory Visit: Payer: Self-pay | Admitting: Dermatology

## 2021-01-28 ENCOUNTER — Other Ambulatory Visit: Payer: Self-pay

## 2021-01-28 ENCOUNTER — Other Ambulatory Visit: Payer: Self-pay | Admitting: Internal Medicine

## 2021-01-28 DIAGNOSIS — L817 Pigmented purpuric dermatosis: Secondary | ICD-10-CM

## 2021-01-28 MED ORDER — MOMETASONE FUROATE 0.1 % EX CREA
TOPICAL_CREAM | CUTANEOUS | 0 refills | Status: DC
Start: 2021-01-28 — End: 2022-07-07
  Filled 2021-01-28: qty 45, 30d supply, fill #0

## 2021-01-28 MED FILL — Levothyroxine Sodium Tab 100 MCG: ORAL | 90 days supply | Qty: 90 | Fill #1 | Status: CN

## 2021-01-28 MED FILL — Metoprolol Tartrate Tab 50 MG: ORAL | 90 days supply | Qty: 180 | Fill #1 | Status: AC

## 2021-01-28 MED FILL — Pravastatin Sodium Tab 40 MG: ORAL | 90 days supply | Qty: 90 | Fill #1 | Status: AC

## 2021-01-29 ENCOUNTER — Other Ambulatory Visit: Payer: Self-pay

## 2021-01-29 MED ORDER — LANSOPRAZOLE 30 MG PO CPDR
DELAYED_RELEASE_CAPSULE | ORAL | 0 refills | Status: DC
  Filled 2021-01-29: qty 90, 90d supply, fill #0

## 2021-01-29 MED ORDER — TRIAMTERENE-HCTZ 37.5-25 MG PO CAPS
1.0000 | ORAL_CAPSULE | Freq: Every day | ORAL | 1 refills | Status: DC
  Filled 2021-01-29: qty 90, 90d supply, fill #0
  Filled 2021-05-22: qty 30, 30d supply, fill #1
  Filled 2021-07-01: qty 30, 30d supply, fill #2
  Filled 2021-07-28: qty 30, 30d supply, fill #3

## 2021-01-30 ENCOUNTER — Other Ambulatory Visit: Payer: Self-pay

## 2021-02-03 DIAGNOSIS — E7800 Pure hypercholesterolemia, unspecified: Secondary | ICD-10-CM | POA: Diagnosis not present

## 2021-02-03 DIAGNOSIS — R739 Hyperglycemia, unspecified: Secondary | ICD-10-CM | POA: Diagnosis not present

## 2021-02-03 DIAGNOSIS — I10 Essential (primary) hypertension: Secondary | ICD-10-CM | POA: Diagnosis not present

## 2021-02-04 LAB — HEMOGLOBIN A1C
Est. average glucose Bld gHb Est-mCnc: 137 mg/dL
Hgb A1c MFr Bld: 6.4 % — ABNORMAL HIGH (ref 4.8–5.6)

## 2021-02-04 LAB — LIPID PANEL
Chol/HDL Ratio: 5 ratio — ABNORMAL HIGH (ref 0.0–4.4)
Cholesterol, Total: 198 mg/dL (ref 100–199)
HDL: 40 mg/dL (ref >39)
LDL Chol Calc (NIH): 124 mg/dL — ABNORMAL HIGH (ref 0–99)
Triglycerides: 189 mg/dL — ABNORMAL HIGH (ref 0–149)
VLDL Cholesterol Cal: 34 mg/dL (ref 5–40)

## 2021-02-04 LAB — BASIC METABOLIC PANEL
BUN/Creatinine Ratio: 12 (ref 12–28)
BUN: 14 mg/dL (ref 8–27)
CO2: 26 mmol/L (ref 20–29)
Calcium: 9.9 mg/dL (ref 8.7–10.3)
Chloride: 103 mmol/L (ref 96–106)
Creatinine, Ser: 1.2 mg/dL — ABNORMAL HIGH (ref 0.57–1.00)
Glucose: 103 mg/dL — ABNORMAL HIGH (ref 70–99)
Potassium: 4 mmol/L (ref 3.5–5.2)
Sodium: 145 mmol/L — ABNORMAL HIGH (ref 134–144)
eGFR: 50 mL/min/{1.73_m2} — ABNORMAL LOW (ref >59)

## 2021-02-04 LAB — HEPATIC FUNCTION PANEL
ALT: 69 IU/L — ABNORMAL HIGH (ref 0–32)
AST: 47 IU/L — ABNORMAL HIGH (ref 0–40)
Albumin: 4.4 g/dL (ref 3.8–4.8)
Alkaline Phosphatase: 88 IU/L (ref 44–121)
Bilirubin Total: 0.3 mg/dL (ref 0.0–1.2)
Bilirubin, Direct: <0.1 mg/dL (ref 0.00–0.40)
Total Protein: 6.8 g/dL (ref 6.0–8.5)

## 2021-02-05 ENCOUNTER — Other Ambulatory Visit: Payer: Self-pay

## 2021-02-06 ENCOUNTER — Telehealth: Payer: Self-pay

## 2021-02-06 NOTE — Telephone Encounter (Signed)
LMTCB for results. 

## 2021-02-10 ENCOUNTER — Telehealth: Payer: Self-pay

## 2021-02-10 NOTE — Telephone Encounter (Addendum)
Pt is returning call from [place]

## 2021-02-10 NOTE — Telephone Encounter (Signed)
LMTCB in regards to lab results.  

## 2021-02-10 NOTE — Telephone Encounter (Signed)
See result note message 

## 2021-02-11 ENCOUNTER — Ambulatory Visit: Payer: 59 | Admitting: Internal Medicine

## 2021-02-12 ENCOUNTER — Other Ambulatory Visit: Payer: Self-pay

## 2021-02-17 ENCOUNTER — Other Ambulatory Visit: Payer: Self-pay

## 2021-02-17 ENCOUNTER — Ambulatory Visit: Payer: 59 | Admitting: Internal Medicine

## 2021-02-17 ENCOUNTER — Encounter: Payer: Self-pay | Admitting: Internal Medicine

## 2021-02-17 DIAGNOSIS — D352 Benign neoplasm of pituitary gland: Secondary | ICD-10-CM | POA: Diagnosis not present

## 2021-02-17 DIAGNOSIS — E039 Hypothyroidism, unspecified: Secondary | ICD-10-CM

## 2021-02-17 DIAGNOSIS — R6884 Jaw pain: Secondary | ICD-10-CM

## 2021-02-17 DIAGNOSIS — N289 Disorder of kidney and ureter, unspecified: Secondary | ICD-10-CM | POA: Diagnosis not present

## 2021-02-17 DIAGNOSIS — K760 Fatty (change of) liver, not elsewhere classified: Secondary | ICD-10-CM

## 2021-02-17 DIAGNOSIS — R944 Abnormal results of kidney function studies: Secondary | ICD-10-CM

## 2021-02-17 DIAGNOSIS — E7800 Pure hypercholesterolemia, unspecified: Secondary | ICD-10-CM

## 2021-02-17 DIAGNOSIS — R739 Hyperglycemia, unspecified: Secondary | ICD-10-CM

## 2021-02-17 DIAGNOSIS — E220 Acromegaly and pituitary gigantism: Secondary | ICD-10-CM

## 2021-02-17 DIAGNOSIS — Z8371 Family history of colonic polyps: Secondary | ICD-10-CM

## 2021-02-17 DIAGNOSIS — I10 Essential (primary) hypertension: Secondary | ICD-10-CM

## 2021-02-17 NOTE — Progress Notes (Signed)
Patient ID: Marie Colon, female   DOB: 10-Jan-1956, 66 y.o.   MRN: [id number]   Subjective:    Patient ID: Marie Colon, female    DOB: 1955/05/24, 66 y.o.   MRN: [id number]  This visit occurred during the SARS-CoV-2 public health emergency.  Safety protocols were in place, including screening questions prior to the visit, additional usage of staff PPE, and extensive cleaning of exam room while observing appropriate contact time as indicated for disinfecting solutions.   Patient here for a scheduled follow up.   Chief Complaint  Patient presents with   Follow-up    R under jaw tenderness,swelling x3 days. Slight pain 2 out of 10    .   HPI Here to follow up regarding her blood pressure, cholesterol and recent labs.  GFR recent lab 50.  Stay hydrated.  Avoid antiinflammatories.  Will recheck today.  No chest pain or sob reported.  Did report that three days ago - soreness right jaw.  Tender to touch.  Right side only.  No sore throat.  No earache.  Minimal cough.  No acid reflux reported.  No abdominal pain.  Bowels moving.  Retired.     Past Medical History:  Diagnosis Date   Acromegaly ([place])    Actinic keratosis    Angiomyolipoma of kidney    evaluated by Dr Bernardo Heater   Fatty liver    GERD (gastroesophageal reflux disease)    Heart murmur    per Dr. Einar Pheasant   History of pyelonephritis    History of ulcerative colitis    Hx of dysplastic nevus 04/21/2016   L upper back, moderate to severe atypia   Hypercholesterolemia    Hypertension    Hypothyroidism    Pituitary macroadenoma ([place])    s/p removal (elevated growth hormone)   Tubal pregnancy    s/p rupture   Wears glasses    Past Surgical History:  Procedure Laterality Date   ABDOMINAL HYSTERECTOMY  2003   abnormal uterine bleeding   APPENDECTOMY     CHOLECYSTECTOMY  2005   COLONOSCOPY WITH PROPOFOL N/A 03/18/2017   Procedure: COLONOSCOPY WITH PROPOFOL;  Surgeon: Lollie Sails, MD;  Location: [facility]  ENDOSCOPY;  Service: Endoscopy;  Laterality: N/A;   DILATION AND CURETTAGE OF UTERUS     MASS EXCISION Right 08/29/2015   Procedure: EXCISION OF RIGHT THIGH MASS SUBFASICAL 15 CM;  Surgeon: Stark Klein, MD;  Location: [facility];  Service: General;  Laterality: Right;   pituitary tumor removal  2006   Family History  Problem Relation Age of Onset   Brain cancer Father        died - 47   Diabetes Mother    Hypothyroidism Sister    Breast cancer Sister 29   Sjogren's syndrome Sister    Breast cancer Sister 3   Diabetes Other        grandmother   Social History   Socioeconomic History   Marital status: Married    Spouse name: Not on file   Number of children: 2   Years of education: Not on file   Highest education level: Not on file  Occupational History    Employer: [employer]    Comment: [employer] - [employer]  Tobacco Use   Smoking status: Never   Smokeless tobacco: Never  Vaping Use   Vaping Use: Never used  Substance and Sexual Activity   Alcohol use: No    Alcohol/week: 0.0 standard drinks   Drug use: No  Sexual activity: Not on file  Other Topics Concern   Not on file  Social History Narrative   Not on file   Social Determinants of Health   Financial Resource Strain: Not on file  Food Insecurity: Not on file  Transportation Needs: Not on file  Physical Activity: Not on file  Stress: Not on file  Social Connections: Not on file     Review of Systems  Constitutional:  Negative for appetite change and unexpected weight change.  HENT:  Negative for congestion and sinus pressure.        Jaw pain - right as outlined.   Respiratory:  Negative for chest tightness and shortness of breath.        Minimal cough.   Cardiovascular:  Negative for chest pain, palpitations and leg swelling.  Gastrointestinal:  Negative for abdominal pain, diarrhea, nausea and vomiting.  Genitourinary:  Negative for difficulty urinating and dysuria.  Musculoskeletal:  Negative for joint swelling and  myalgias.  Skin:  Negative for color change and rash.  Neurological:  Negative for dizziness, light-headedness and headaches.  Psychiatric/Behavioral:  Negative for agitation and dysphoric mood.       Objective:     BP 124/80    Pulse (!) 52    Temp 98.2 F (36.8 C) (Oral)    Resp 14    Ht 5' 3" (1.6 m)    Wt 170 lb 6.4 oz (77.3 kg)    SpO2 99%    BMI 30.19 kg/m  Wt Readings from Last 3 Encounters:  02/17/21 170 lb 6.4 oz (77.3 kg)  10/08/20 171 lb 12.8 oz (77.9 kg)  08/16/20 169 lb 1.5 oz (76.7 kg)    Physical Exam Vitals reviewed.  Constitutional:      General: She is not in acute distress.    Appearance: Normal appearance.  HENT:     Head: Normocephalic and atraumatic.     Comments: No palpable nodule.  Opening and closing jaw without significant pain.     Right Ear: External ear normal.     Left Ear: External ear normal.  Eyes:     General: No scleral icterus.       Right eye: No discharge.        Left eye: No discharge.     Conjunctiva/sclera: Conjunctivae normal.  Neck:     Thyroid: No thyromegaly.  Cardiovascular:     Rate and Rhythm: Normal rate and regular rhythm.  Pulmonary:     Effort: No respiratory distress.     Breath sounds: Normal breath sounds. No wheezing.  Abdominal:     General: Bowel sounds are normal.     Palpations: Abdomen is soft.     Tenderness: There is no abdominal tenderness.  Musculoskeletal:        General: No swelling or tenderness.     Cervical back: Neck supple. No tenderness.  Lymphadenopathy:     Cervical: No cervical adenopathy.  Skin:    Findings: No erythema or rash.  Neurological:     Mental Status: She is alert.  Psychiatric:        Mood and Affect: Mood normal.        Behavior: Behavior normal.     Outpatient Encounter Medications as of 02/17/2021  Medication Sig   allopurinol (ZYLOPRIM) 100 MG tablet TAKE 1 TABLET BY MOUTH DAILY.   aspirin EC 81 MG tablet Take 81 mg by mouth daily.   cabergoline (DOSTINEX) 0.5 MG  tablet Take 1/2 tablets (  0.25 mg total) by mouth once daily   COVID-19 mRNA bivalent vaccine, Pfizer, (PFIZER COVID-19 VAC BIVALENT) injection Inject into the muscle.   lansoprazole (PREVACID) 30 MG capsule TAKE 1 CAPSULE BY MOUTH DAILY AT 12 NOON.   levothyroxine (SYNTHROID) 100 MCG tablet TAKE 1 TABLET BY MOUTH DAILY BEFORE BREAKFAST.   metoprolol tartrate (LOPRESSOR) 50 MG tablet TAKE 1 TABLET BY MOUTH TWICE DAILY   METRONIDAZOLE, TOPICAL, 0.75 % LOTN Apply 1 application topically at bedtime. qhs to face for Rosacea   mometasone (ELOCON) 0.1 % cream Apply to affected areas/rash on lower legs once to twice a day.  Avoid face, groin, and axilla.   Multiple Vitamin (MULTIVITAMIN) tablet Take 1 tablet by mouth daily.   pravastatin (PRAVACHOL) 40 MG tablet TAKE 1 TABLET (40 MG TOTAL) BY MOUTH DAILY.   triamterene-hydrochlorothiazide (DYAZIDE) 37.5-25 MG capsule TAKE 1 CAPSULE BY MOUTH DAILY.   [DISCONTINUED] cabergoline (DOSTINEX) 0.5 MG tablet Take 0.5 tablets (0.25 mg total) by mouth once daily   No facility-administered encounter medications on file as of 02/17/2021.     Lab Results  Component Value Date   WBC 6.6 09/27/2020   HGB 13.3 09/27/2020   HCT 38.3 09/27/2020   PLT 188 09/27/2020   GLUCOSE 93 02/17/2021   CHOL 198 02/03/2021   TRIG 189 (H) 02/03/2021   HDL 40 02/03/2021   LDLCALC 124 (H) 02/03/2021   ALT 69 (H) 02/03/2021   AST 47 (H) 02/03/2021   NA 143 02/17/2021   K 4.0 02/17/2021   CL 102 02/17/2021   CREATININE 1.09 (H) 02/17/2021   BUN 15 02/17/2021   CO2 25 02/17/2021   TSH 3.350 09/27/2020   HGBA1C 6.4 (H) 02/03/2021      Assessment & Plan:   Problem List Items Addressed This Visit     Acromegaly ([place])    Continues on cabergoline.  Followed by endocrinology.        Decreased GFR    Decreased on recent check - 50.  Stay hydrated.  Avoid antiinflammatories.  Recheck met b today.       Relevant Orders   Basic metabolic panel (Completed)   Family  history of colonic polyps    Colonoscopy 08/2017 - one polyp ascending colon.        Fatty liver    Diet and exercise.  Has seen GI.  With recent labs, discussed f/u with GI.  Notify when ready to schedule. Follow liver function tests.        Hypercholesteremia    On pravastatin.  Low cholesterol diet and exercise.  Follow lipid panel and liver function tests.       Hyperglycemia    Low carb diet and exercise.  Follow met b and a1c.       Hypertension    Continue metoprolol and triam/hctz.  Blood pressure as outlined.  No changes in medication.  Follow pressures.  Follow metabolic panel.       Hypothyroidism    On thyroid replacement.  Follow tsh.       Jaw pain    No increased headache reported.  No pain with talking/chewing.  Exam as outlined.  Conservative measures - tylenol, etc.  Follow.  Notify me if persistent.       Pituitary macroadenoma ([facility])    Previous - elevated GH - acromegaly.  Followed by Dr Gabriel Carina.  On cabergoline.  Recommended f/u in year.       Renal insufficiency    Decreased on recent  check - 50.  Stay hydrated.  Avoid antiinflammatories.  Recheck met b today.         Einar Pheasant, MD

## 2021-02-18 LAB — BASIC METABOLIC PANEL
BUN/Creatinine Ratio: 14 (ref 12–28)
BUN: 15 mg/dL (ref 8–27)
CO2: 25 mmol/L (ref 20–29)
Calcium: 9.5 mg/dL (ref 8.7–10.3)
Chloride: 102 mmol/L (ref 96–106)
Creatinine, Ser: 1.09 mg/dL — ABNORMAL HIGH (ref 0.57–1.00)
Glucose: 93 mg/dL (ref 70–99)
Potassium: 4 mmol/L (ref 3.5–5.2)
Sodium: 143 mmol/L (ref 134–144)
eGFR: 56 mL/min/{1.73_m2} — ABNORMAL LOW (ref >59)

## 2021-02-23 ENCOUNTER — Encounter: Payer: Self-pay | Admitting: Internal Medicine

## 2021-02-23 DIAGNOSIS — R6884 Jaw pain: Secondary | ICD-10-CM | POA: Insufficient documentation

## 2021-02-23 NOTE — Assessment & Plan Note (Signed)
Colonoscopy 08/2017 - one polyp ascending colon.   

## 2021-02-23 NOTE — Assessment & Plan Note (Signed)
Decreased on recent check - 50.  Stay hydrated.  Avoid antiinflammatories.  Recheck met b today.

## 2021-02-23 NOTE — Assessment & Plan Note (Signed)
Previous - elevated GH - acromegaly.  Followed by Dr Solum.  On cabergoline.  Recommended f/u in year.  

## 2021-02-23 NOTE — Assessment & Plan Note (Signed)
Continue metoprolol and triam/hctz.  Blood pressure as outlined.  No changes in medication.  Follow pressures.  Follow metabolic panel.

## 2021-02-23 NOTE — Assessment & Plan Note (Signed)
Continues on cabergoline.  Followed by endocrinology.   

## 2021-02-23 NOTE — Assessment & Plan Note (Signed)
No increased headache reported.  No pain with talking/chewing.  Exam as outlined.  Conservative measures - tylenol, etc.  Follow.  Notify me if persistent.

## 2021-02-23 NOTE — Assessment & Plan Note (Addendum)
Diet and exercise.  Has seen GI.  With recent labs, discussed f/u with GI.  Notify when ready to schedule. Follow liver function tests.

## 2021-02-23 NOTE — Assessment & Plan Note (Signed)
On pravastatin.  Low cholesterol diet and exercise.  Follow lipid panel and liver function tests.   

## 2021-02-23 NOTE — Assessment & Plan Note (Signed)
On thyroid replacement.  Follow tsh.  

## 2021-02-23 NOTE — Assessment & Plan Note (Signed)
Low carb diet and exercise.  Follow met b and a1c.

## 2021-04-01 ENCOUNTER — Other Ambulatory Visit: Payer: Self-pay

## 2021-04-01 MED FILL — Levothyroxine Sodium Tab 100 MCG: ORAL | 30 days supply | Qty: 30 | Fill #1 | Status: AC

## 2021-04-01 MED FILL — Levothyroxine Sodium Tab 100 MCG: ORAL | 90 days supply | Qty: 90 | Fill #1 | Status: CN

## 2021-04-02 ENCOUNTER — Other Ambulatory Visit: Payer: Self-pay

## 2021-04-28 ENCOUNTER — Other Ambulatory Visit: Payer: Self-pay

## 2021-04-28 MED FILL — Levothyroxine Sodium Tab 100 MCG: ORAL | 30 days supply | Qty: 30 | Fill #2 | Status: AC

## 2021-05-21 ENCOUNTER — Other Ambulatory Visit: Payer: Self-pay

## 2021-05-21 ENCOUNTER — Encounter: Payer: Self-pay | Admitting: Internal Medicine

## 2021-05-21 DIAGNOSIS — Z87898 Personal history of other specified conditions: Secondary | ICD-10-CM | POA: Diagnosis not present

## 2021-05-21 DIAGNOSIS — E7800 Pure hypercholesterolemia, unspecified: Secondary | ICD-10-CM

## 2021-05-21 DIAGNOSIS — R739 Hyperglycemia, unspecified: Secondary | ICD-10-CM

## 2021-05-21 DIAGNOSIS — E039 Hypothyroidism, unspecified: Secondary | ICD-10-CM

## 2021-05-21 DIAGNOSIS — R7303 Prediabetes: Secondary | ICD-10-CM | POA: Diagnosis not present

## 2021-05-21 DIAGNOSIS — I10 Essential (primary) hypertension: Secondary | ICD-10-CM

## 2021-05-21 DIAGNOSIS — E220 Acromegaly and pituitary gigantism: Secondary | ICD-10-CM | POA: Diagnosis not present

## 2021-05-21 MED ORDER — CABERGOLINE 0.5 MG PO TABS
ORAL_TABLET | ORAL | 3 refills | Status: DC
  Filled 2021-05-21: qty 15, 30d supply, fill #0
  Filled 2021-07-01: qty 15, 30d supply, fill #1
  Filled 2021-07-28: qty 15, 30d supply, fill #2
  Filled 2021-08-19: qty 15, 30d supply, fill #3
  Filled 2021-09-24: qty 15, 30d supply, fill #4
  Filled 2021-10-23: qty 15, 30d supply, fill #5
  Filled 2021-11-11: qty 15, 30d supply, fill #6
  Filled 2021-12-23: qty 15, 30d supply, fill #7
  Filled 2022-01-12 – 2022-02-18 (×2): qty 15, 30d supply, fill #8
  Filled 2022-04-14: qty 16, 32d supply, fill #9

## 2021-05-21 NOTE — Telephone Encounter (Signed)
Orders placed for [facility] labs.  ?

## 2021-05-22 ENCOUNTER — Other Ambulatory Visit: Payer: Self-pay | Admitting: Internal Medicine

## 2021-05-22 ENCOUNTER — Other Ambulatory Visit: Payer: Self-pay

## 2021-05-22 MED FILL — Metoprolol Tartrate Tab 50 MG: ORAL | 30 days supply | Qty: 60 | Fill #0 | Status: AC

## 2021-05-22 MED FILL — Lansoprazole Cap Delayed Release 30 MG: ORAL | 30 days supply | Qty: 30 | Fill #0 | Status: AC

## 2021-05-22 MED FILL — Pravastatin Sodium Tab 40 MG: ORAL | 30 days supply | Qty: 30 | Fill #0 | Status: AC

## 2021-05-22 MED FILL — Levothyroxine Sodium Tab 100 MCG: ORAL | 30 days supply | Qty: 30 | Fill #3 | Status: CN

## 2021-05-23 LAB — BASIC METABOLIC PANEL
BUN/Creatinine Ratio: 10 — ABNORMAL LOW (ref 12–28)
BUN: 13 mg/dL (ref 8–27)
CO2: 25 mmol/L (ref 20–29)
Calcium: 9.4 mg/dL (ref 8.7–10.3)
Chloride: 102 mmol/L (ref 96–106)
Creatinine, Ser: 1.24 mg/dL — ABNORMAL HIGH (ref 0.57–1.00)
Glucose: 91 mg/dL (ref 70–99)
Potassium: 3.8 mmol/L (ref 3.5–5.2)
Sodium: 142 mmol/L (ref 134–144)
eGFR: 48 mL/min/{1.73_m2} — ABNORMAL LOW (ref >59)

## 2021-05-23 LAB — LIPID PANEL
Chol/HDL Ratio: 5.2 ratio — ABNORMAL HIGH (ref 0.0–4.4)
Cholesterol, Total: 191 mg/dL (ref 100–199)
HDL: 37 mg/dL — ABNORMAL LOW (ref >39)
LDL Chol Calc (NIH): 122 mg/dL — ABNORMAL HIGH (ref 0–99)
Triglycerides: 178 mg/dL — ABNORMAL HIGH (ref 0–149)
VLDL Cholesterol Cal: 32 mg/dL (ref 5–40)

## 2021-05-23 LAB — HEPATIC FUNCTION PANEL
ALT: 56 IU/L — ABNORMAL HIGH (ref 0–32)
AST: 43 IU/L — ABNORMAL HIGH (ref 0–40)
Albumin: 4.2 g/dL (ref 3.8–4.8)
Alkaline Phosphatase: 86 IU/L (ref 44–121)
Bilirubin Total: 0.2 mg/dL (ref 0.0–1.2)
Bilirubin, Direct: <0.1 mg/dL (ref 0.00–0.40)
Total Protein: 6.2 g/dL (ref 6.0–8.5)

## 2021-05-23 LAB — HEMOGLOBIN A1C
Est. average glucose Bld gHb Est-mCnc: 128 mg/dL
Hgb A1c MFr Bld: 6.1 % — ABNORMAL HIGH (ref 4.8–5.6)

## 2021-05-23 LAB — TSH: TSH: 2.26 u[IU]/mL (ref 0.450–4.500)

## 2021-05-26 ENCOUNTER — Other Ambulatory Visit: Payer: Self-pay

## 2021-05-26 DIAGNOSIS — N289 Disorder of kidney and ureter, unspecified: Secondary | ICD-10-CM

## 2021-05-28 ENCOUNTER — Other Ambulatory Visit: Payer: Self-pay

## 2021-05-28 MED FILL — Levothyroxine Sodium Tab 100 MCG: ORAL | 30 days supply | Qty: 30 | Fill #3 | Status: AC

## 2021-05-29 ENCOUNTER — Other Ambulatory Visit: Payer: Self-pay

## 2021-06-18 ENCOUNTER — Other Ambulatory Visit (HOSPITAL_COMMUNITY)
Admission: RE | Admit: 2021-06-18 | Discharge: 2021-06-18 | Disposition: A | Discharge status: Home / Self Care | Payer: PPO | Source: Ambulatory Visit | Attending: Internal Medicine | Admitting: Internal Medicine

## 2021-06-18 ENCOUNTER — Ambulatory Visit (INDEPENDENT_AMBULATORY_CARE_PROVIDER_SITE_OTHER): Payer: PPO | Admitting: Internal Medicine

## 2021-06-18 ENCOUNTER — Encounter: Payer: Self-pay | Admitting: Internal Medicine

## 2021-06-18 VITALS — BP 126/78 | HR 59 | Temp 98.5°F | Resp 13 | Ht 63.0 in | Wt 171.2 lb

## 2021-06-18 DIAGNOSIS — I10 Essential (primary) hypertension: Secondary | ICD-10-CM | POA: Diagnosis not present

## 2021-06-18 DIAGNOSIS — E220 Acromegaly and pituitary gigantism: Secondary | ICD-10-CM | POA: Diagnosis not present

## 2021-06-18 DIAGNOSIS — N644 Mastodynia: Secondary | ICD-10-CM | POA: Diagnosis not present

## 2021-06-18 DIAGNOSIS — Z0000 Encounter for general adult medical examination without abnormal findings: Secondary | ICD-10-CM

## 2021-06-18 DIAGNOSIS — D352 Benign neoplasm of pituitary gland: Secondary | ICD-10-CM | POA: Diagnosis not present

## 2021-06-18 DIAGNOSIS — Z1151 Encounter for screening for human papillomavirus (HPV): Secondary | ICD-10-CM | POA: Diagnosis not present

## 2021-06-18 DIAGNOSIS — R0602 Shortness of breath: Secondary | ICD-10-CM | POA: Diagnosis not present

## 2021-06-18 DIAGNOSIS — R944 Abnormal results of kidney function studies: Secondary | ICD-10-CM

## 2021-06-18 DIAGNOSIS — K760 Fatty (change of) liver, not elsewhere classified: Secondary | ICD-10-CM

## 2021-06-18 DIAGNOSIS — Z13820 Encounter for screening for osteoporosis: Secondary | ICD-10-CM | POA: Diagnosis not present

## 2021-06-18 DIAGNOSIS — Z124 Encounter for screening for malignant neoplasm of cervix: Secondary | ICD-10-CM | POA: Diagnosis not present

## 2021-06-18 DIAGNOSIS — N289 Disorder of kidney and ureter, unspecified: Secondary | ICD-10-CM | POA: Diagnosis not present

## 2021-06-18 DIAGNOSIS — Z8371 Family history of colonic polyps: Secondary | ICD-10-CM

## 2021-06-18 DIAGNOSIS — E039 Hypothyroidism, unspecified: Secondary | ICD-10-CM | POA: Diagnosis not present

## 2021-06-18 DIAGNOSIS — Z1231 Encounter for screening mammogram for malignant neoplasm of breast: Secondary | ICD-10-CM | POA: Diagnosis not present

## 2021-06-18 DIAGNOSIS — E7800 Pure hypercholesterolemia, unspecified: Secondary | ICD-10-CM

## 2021-06-18 DIAGNOSIS — R739 Hyperglycemia, unspecified: Secondary | ICD-10-CM

## 2021-06-18 LAB — BASIC METABOLIC PANEL
BUN: 18 mg/dL (ref 6–23)
CO2: 32 mEq/L (ref 19–32)
Calcium: 9.9 mg/dL (ref 8.4–10.5)
Chloride: 101 mEq/L (ref 96–112)
Creatinine, Ser: 1.25 mg/dL — ABNORMAL HIGH (ref 0.40–1.20)
GFR: 45.08 mL/min — ABNORMAL LOW (ref >60.00)
Glucose, Bld: 93 mg/dL (ref 70–99)
Potassium: 3.9 mEq/L (ref 3.5–5.1)
Sodium: 142 mEq/L (ref 135–145)

## 2021-06-18 NOTE — Assessment & Plan Note (Addendum)
Physical today 06/18/21. PAP 06/18/21.  Colonoscopy 02/2017 - recommended f/u in 5 years.  (tubular adenoma). Schedule diagnostic mammogram as outlined.

## 2021-06-18 NOTE — Progress Notes (Signed)
Patient ID: Marie Colon, female   DOB: Mar 10, 1955, 66 y.o.   MRN: [id number]   Subjective:    Patient ID: Marie Colon, female    DOB: [birth year], 66 y.o.   MRN: [id number]   Patient here for a complete physical exam.   Chief Complaint  Patient presents with   Annual Exam    CPE   .   HPI Reports she is doing relatively well.  Retired. Has been starting to exercise more.  Has noticed some sob with exertion - inclines.  Was questioning if stamina issue.  No chest pain.  No increased heart rate or palpitations.  No acid relfux reported.  No abdominal pain.  Bowels moving. Did report some pain left breast - 10:00 position.  Last week - nipple tender.  No palpable nodule.  No increased caffeine intake. No trauma.     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 Einar Pheasant   History of pyelonephritis    History of ulcerative colitis    Hx of dysplastic nevus 04/21/2016   L upper back, moderate to severe atypia   Hypercholesterolemia    Hypertension    Hypothyroidism    Pituitary macroadenoma ([place])    s/p removal (elevated growth hormone)   Tubal pregnancy    s/p rupture   Wears glasses    Past Surgical History:  Procedure Laterality Date   ABDOMINAL HYSTERECTOMY  2003   abnormal uterine bleeding   APPENDECTOMY     CHOLECYSTECTOMY  2005   COLONOSCOPY WITH PROPOFOL N/A 03/18/2017   Procedure: COLONOSCOPY WITH PROPOFOL;  Surgeon: Lollie Sails, MD;  [facility] ENDOSCOPY;  Service: Endoscopy;  Laterality: N/A;   DILATION AND CURETTAGE OF UTERUS     MASS EXCISION Right 08/29/2015   Procedure: EXCISION OF RIGHT THIGH MASS SUBFASICAL 15 CM;  Surgeon: Stark Klein, MD;  Location: [place];  Service: General;  Laterality: Right;   pituitary tumor removal  2006   Family History  Problem Relation  Age of Onset   Brain cancer Father        died - 46   Diabetes Mother    Hypothyroidism Sister    Breast cancer Sister 62   Sjogren's syndrome Sister    Breast cancer Sister 26   Diabetes Other        grandmother   Social History   Socioeconomic History   Marital status: Married    Spouse name: Not on file   Number of children: 2   Years of education: Not on file   Highest education level: Not on file  Occupational History    Employer: [employer]    Comment: [employer] - [employer]  Tobacco Use   Smoking status: Never   Smokeless tobacco: Never  Vaping Use   Vaping Use: Never used  Substance and Sexual Activity   Alcohol use: No    Alcohol/week: 0.0 standard drinks   Drug use: No   Sexual activity: Not on file  Other Topics Concern   Not on file  Social History Narrative   Not on file   Social Determinants of Health   Financial Resource Strain: Not on file  Food Insecurity: Not on file  Transportation Needs: Not on file  Physical Activity:  Not on file  Stress: Not on file  Social Connections: Not on file     Review of Systems  Constitutional:  Negative for appetite change and unexpected weight change.  HENT:  Negative for congestion, sinus pressure and sore throat.   Eyes:  Negative for pain and visual disturbance.  Respiratory:  Positive for shortness of breath. Negative for cough and chest tightness.   Cardiovascular:  Negative for chest pain and palpitations.  Gastrointestinal:  Negative for abdominal pain, diarrhea, nausea and vomiting.  Genitourinary:  Negative for difficulty urinating and dysuria.  Musculoskeletal:  Negative for joint swelling and myalgias.  Skin:  Negative for color change and rash.  Neurological:  Negative for dizziness, light-headedness and headaches.  Hematological:  Negative for adenopathy. Does not bruise/bleed easily.  Psychiatric/Behavioral:  Negative for agitation and dysphoric mood.       Objective:     BP 126/78 (BP Location: Left  Arm, Patient Position: Sitting, Cuff Size: Small)   Pulse (!) 59   Temp 98.5 F (36.9 C) (Temporal)   Resp 13   Ht _0  (1.6 m)   Wt 171 lb 3.2 oz (77.7 kg)   SpO2 96%   BMI 30.33 kg/m  Wt Readings from Last 3 Encounters:  06/18/21 171 lb 3.2 oz (77.7 kg)  02/17/21 170 lb 6.4 oz (77.3 kg)  10/08/20 171 lb 12.8 oz (77.9 kg)    Physical Exam Vitals reviewed.  Constitutional:      General: She is not in acute distress.    Appearance: Normal appearance.  HENT:     Head: Normocephalic and atraumatic.     Right Ear: External ear normal.     Left Ear: External ear normal.  Eyes:     General: No scleral icterus.       Right eye: No discharge.        Left eye: No discharge.     Conjunctiva/sclera: Conjunctivae normal.  Neck:     Thyroid: No thyromegaly.  Cardiovascular:     Rate and Rhythm: Normal rate and regular rhythm.  Pulmonary:     Effort: No respiratory distress.     Breath sounds: Normal breath sounds. No wheezing.     Comments: Breasts:  tenderness to palpation - left breast 10:00 region.  No palpable nodule.  No nipple retraction or nipple discharge present (bilateral).  No palpable nodules or axillary adenopathy.  Abdominal:     General: Bowel sounds are normal.     Palpations: Abdomen is soft.     Tenderness: There is no abdominal tenderness.  Genitourinary:    Comments: Normal external genitalia.  Vaginal vault without lesions. S/p hysterectomy. Pap smear performed.  Could not appreciate any adnexal masses or tenderness.   Musculoskeletal:        General: No swelling or tenderness.     Cervical back: Neck supple. No tenderness.  Lymphadenopathy:     Cervical: No cervical adenopathy.  Skin:    Findings: No erythema or rash.  Neurological:     Mental Status: She is alert.  Psychiatric:        Mood and Affect: Mood normal.        Behavior: Behavior normal.     Outpatient Encounter Medications as of 06/18/2021  Medication Sig   allopurinol (ZYLOPRIM) 100  MG tablet TAKE 1 TABLET BY MOUTH DAILY.   aspirin EC 81 MG tablet Take 81 mg by mouth daily.   cabergoline (DOSTINEX) 0.5 MG tablet Take 1/2 tablets (0.25  mg total) by mouth once daily   cabergoline (DOSTINEX) 0.5 MG tablet Take 0.5 tablets (0.25 mg total) by mouth once daily   COVID-19 mRNA bivalent vaccine, Pfizer, (PFIZER COVID-19 VAC BIVALENT) injection Inject into the muscle.   lansoprazole (PREVACID) 30 MG capsule TAKE 1 CAPSULE BY MOUTH DAILY AT 12 NOON.   levothyroxine (SYNTHROID) 100 MCG tablet TAKE 1 TABLET BY MOUTH DAILY BEFORE BREAKFAST.   metoprolol tartrate (LOPRESSOR) 50 MG tablet TAKE 1 TABLET BY MOUTH TWICE DAILY   METRONIDAZOLE, TOPICAL, 0.75 % LOTN Apply 1 application topically at bedtime. qhs to face for Rosacea   mometasone (ELOCON) 0.1 % cream Apply to affected areas/rash on lower legs once to twice a day.  Avoid face, groin, and axilla.   Multiple Vitamin (MULTIVITAMIN) tablet Take 1 tablet by mouth daily.   pravastatin (PRAVACHOL) 40 MG tablet TAKE 1 TABLET (40 MG TOTAL) BY MOUTH DAILY.   triamterene-hydrochlorothiazide (DYAZIDE) 37.5-25 MG capsule TAKE 1 CAPSULE BY MOUTH DAILY.   No facility-administered encounter medications on file as of 06/18/2021.     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  Followed by endocrinology.         Breast tenderness    Exam as outlined.  Due screening mammogram.   Schedule bilateral diagnostic mammogram with possible left breast ultrasound.  Denies any trauma or injury.  Denies increased caffeine intake.        Relevant Orders   MM DIAG BREAST TOMO BILATERAL   US BREAST LTD UNI LEFT INC AXILLA   Decreased GFR    Stay hydrated.  rehceck met b.        Relevant Orders   Basic metabolic panel   Family history of colonic polyps    Colonoscopy 08/2017 - one polyp ascending colon.         Fatty liver    Diet and exercise.  Has seen GI. Follow liver function tests.         Relevant Orders   Hepatic function panel   Health care maintenance    Physical today 06/18/21. PAP 06/18/21.  Colonoscopy 02/2017 - recommended f/u in 5 years.  (tubular adenoma). Schedule diagnostic mammogram as outlined.          Hypercholesteremia    On pravastatin.  Low cholesterol diet and exercise.  Follow lipid panel and liver function tests.        Relevant Orders   CBC with Differential/Platelet   Lipid panel   Hyperglycemia    Low carb diet and exercise.  Follow met b and a1c.        Relevant Orders   Hemoglobin A1c   Hypertension    Continue metoprolol and triam/hctz.  Blood pressure as outlined.  No changes  in medication.  Follow pressures.  Follow metabolic panel.        Hypothyroidism    On thyroid replacement.  Follow tsh.        Pituitary macroadenoma ([facility])    Previous - elevated GH - acromegaly.  Followed by Dr Gabriel Carina.  On cabergoline.  Recommended f/u in year.        SOB (shortness of breath) on exertion    Noticed with inclines.  EKG - SR with no acute ischemic changes noted.  TWI present previously in V1 and V2.  Discussed further cardiac w/up, including referral to cardiology for further evaluation and testing.  Also discussed calcium score. She wants to monitor. Follow.  Notify or be evaluated if any change or worsening symptoms.        Other Visit Diagnoses     Encounter for screening mammogram for malignant neoplasm of breast     -  Primary   Screening for cervical cancer       Relevant Orders   Cytology - PAP( [place]) (Completed)   Screening for osteoporosis       Relevant Orders   DG Bone Density   Function kidney decreased       Relevant Orders   Basic Metabolic Panel (BMET) (Completed)   Shortness of breath       Relevant Orders   EKG 12-Lead (Completed)        Einar Pheasant, MD

## 2021-06-19 LAB — CYTOLOGY - PAP
Comment: NEGATIVE
Diagnosis: NEGATIVE
High risk HPV: NEGATIVE

## 2021-06-24 ENCOUNTER — Encounter: Payer: Self-pay | Admitting: Internal Medicine

## 2021-06-24 DIAGNOSIS — R0602 Shortness of breath: Secondary | ICD-10-CM | POA: Insufficient documentation

## 2021-06-24 NOTE — Assessment & Plan Note (Addendum)
Exam as outlined.  Due screening mammogram.  Schedule bilateral diagnostic mammogram with possible left breast ultrasound.  Denies any trauma or injury.  Denies increased caffeine intake.

## 2021-06-24 NOTE — Assessment & Plan Note (Signed)
Diet and exercise.  Has seen GI. Follow liver function tests.   

## 2021-06-24 NOTE — Assessment & Plan Note (Signed)
Noticed with inclines.  EKG - SR with no acute ischemic changes noted.  TWI present previously in V1 and V2.  Discussed further cardiac w/up, including referral to cardiology for further evaluation and testing.  Also discussed calcium score. She wants to monitor. Follow.  Notify or be evaluated if any change or worsening symptoms.

## 2021-06-24 NOTE — Assessment & Plan Note (Signed)
Colonoscopy 08/2017 - one polyp ascending colon.   

## 2021-06-24 NOTE — Assessment & Plan Note (Signed)
Stay hydrated.  rehceck met b.

## 2021-06-24 NOTE — Assessment & Plan Note (Signed)
Low carb diet and exercise.  Follow met b and a1c.  

## 2021-06-24 NOTE — Assessment & Plan Note (Signed)
Continues on cabergoline.  Followed by endocrinology.   

## 2021-06-24 NOTE — Assessment & Plan Note (Signed)
On pravastatin.  Low cholesterol diet and exercise.  Follow lipid panel and liver function tests.   

## 2021-06-24 NOTE — Assessment & Plan Note (Signed)
Previous - elevated GH - acromegaly.  Followed by Dr Solum.  On cabergoline.  Recommended f/u in year.  

## 2021-06-24 NOTE — Assessment & Plan Note (Signed)
Continue metoprolol and triam/hctz.  Blood pressure as outlined.  No changes in medication.  Follow pressures.  Follow metabolic panel.

## 2021-06-24 NOTE — Assessment & Plan Note (Signed)
On thyroid replacement.  Follow tsh.  

## 2021-06-26 NOTE — Telephone Encounter (Signed)
Pt returning call and stated you can respond through mychart with codes or leave a voicemail

## 2021-06-27 ENCOUNTER — Other Ambulatory Visit: Payer: Self-pay

## 2021-07-01 ENCOUNTER — Other Ambulatory Visit: Payer: Self-pay

## 2021-07-01 MED FILL — Lansoprazole Cap Delayed Release 30 MG: ORAL | 30 days supply | Qty: 30 | Fill #1 | Status: AC

## 2021-07-01 MED FILL — Metoprolol Tartrate Tab 50 MG: ORAL | 30 days supply | Qty: 60 | Fill #1 | Status: AC

## 2021-07-01 MED FILL — Pravastatin Sodium Tab 40 MG: ORAL | 30 days supply | Qty: 30 | Fill #1 | Status: AC

## 2021-07-01 MED FILL — Levothyroxine Sodium Tab 100 MCG: ORAL | 30 days supply | Qty: 30 | Fill #4 | Status: AC

## 2021-07-07 ENCOUNTER — Ambulatory Visit
Admission: RE | Admit: 2021-07-07 | Discharge: 2021-07-07 | Disposition: A | Discharge status: Home / Self Care | Payer: PPO | Source: Ambulatory Visit | Attending: Internal Medicine | Admitting: Internal Medicine

## 2021-07-07 DIAGNOSIS — N644 Mastodynia: Secondary | ICD-10-CM | POA: Diagnosis not present

## 2021-07-07 DIAGNOSIS — R928 Other abnormal and inconclusive findings on diagnostic imaging of breast: Secondary | ICD-10-CM | POA: Diagnosis not present

## 2021-07-09 ENCOUNTER — Other Ambulatory Visit: Payer: Self-pay | Admitting: Internal Medicine

## 2021-07-09 DIAGNOSIS — R944 Abnormal results of kidney function studies: Secondary | ICD-10-CM

## 2021-07-09 DIAGNOSIS — Z136 Encounter for screening for cardiovascular disorders: Secondary | ICD-10-CM

## 2021-07-09 DIAGNOSIS — R0602 Shortness of breath: Secondary | ICD-10-CM

## 2021-07-09 NOTE — Progress Notes (Signed)
Orders placed for renal ultrasound and CT calcium score.

## 2021-07-28 ENCOUNTER — Other Ambulatory Visit: Payer: Self-pay

## 2021-07-28 ENCOUNTER — Ambulatory Visit
Admission: RE | Admit: 2021-07-28 | Discharge: 2021-07-28 | Disposition: A | Discharge status: Home / Self Care | Payer: PPO | Source: Ambulatory Visit | Attending: Internal Medicine | Admitting: Internal Medicine

## 2021-07-28 DIAGNOSIS — N189 Chronic kidney disease, unspecified: Secondary | ICD-10-CM | POA: Diagnosis not present

## 2021-07-28 DIAGNOSIS — R944 Abnormal results of kidney function studies: Secondary | ICD-10-CM | POA: Insufficient documentation

## 2021-07-28 DIAGNOSIS — Z136 Encounter for screening for cardiovascular disorders: Secondary | ICD-10-CM | POA: Insufficient documentation

## 2021-07-28 MED FILL — Levothyroxine Sodium Tab 100 MCG: ORAL | 30 days supply | Qty: 30 | Fill #5 | Status: AC

## 2021-07-28 MED FILL — Pravastatin Sodium Tab 40 MG: ORAL | 30 days supply | Qty: 30 | Fill #2 | Status: AC

## 2021-07-28 MED FILL — Lansoprazole Cap Delayed Release 30 MG: ORAL | 30 days supply | Qty: 30 | Fill #2 | Status: AC

## 2021-07-28 MED FILL — Metoprolol Tartrate Tab 50 MG: ORAL | 30 days supply | Qty: 60 | Fill #2 | Status: AC

## 2021-08-07 ENCOUNTER — Telehealth: Payer: Self-pay

## 2021-08-07 ENCOUNTER — Other Ambulatory Visit: Payer: Self-pay

## 2021-08-07 DIAGNOSIS — R0602 Shortness of breath: Secondary | ICD-10-CM

## 2021-08-07 NOTE — Telephone Encounter (Signed)
Referral placed.

## 2021-08-07 NOTE — Telephone Encounter (Signed)
Patient states she just spoke with Denita Lung, CMA, regarding a referral.  Patient states she would like to be referred to Serafina Royals, MD, at [facility].

## 2021-08-12 ENCOUNTER — Ambulatory Visit
Admission: RE | Admit: 2021-08-12 | Discharge: 2021-08-12 | Disposition: A | Discharge status: Home / Self Care | Payer: PPO | Source: Ambulatory Visit | Attending: Internal Medicine | Admitting: Internal Medicine

## 2021-08-12 DIAGNOSIS — E039 Hypothyroidism, unspecified: Secondary | ICD-10-CM | POA: Diagnosis not present

## 2021-08-12 DIAGNOSIS — Z780 Asymptomatic menopausal state: Secondary | ICD-10-CM | POA: Insufficient documentation

## 2021-08-12 DIAGNOSIS — Z13820 Encounter for screening for osteoporosis: Secondary | ICD-10-CM | POA: Insufficient documentation

## 2021-08-13 ENCOUNTER — Encounter: Payer: Self-pay | Admitting: Internal Medicine

## 2021-08-19 ENCOUNTER — Other Ambulatory Visit: Payer: Self-pay | Admitting: Internal Medicine

## 2021-08-19 ENCOUNTER — Other Ambulatory Visit: Payer: Self-pay

## 2021-08-19 MED ORDER — ALLOPURINOL 100 MG PO TABS
ORAL_TABLET | Freq: Every day | ORAL | 3 refills | Status: DC
  Filled 2021-08-19: qty 90, 90d supply, fill #0
  Filled 2021-09-24 – 2021-11-11 (×3): qty 90, 90d supply, fill #1
  Filled 2022-02-18: qty 90, 90d supply, fill #2
  Filled 2022-05-14: qty 90, 90d supply, fill #3

## 2021-08-19 MED ORDER — TRIAMTERENE-HCTZ 37.5-25 MG PO CAPS
1.0000 | ORAL_CAPSULE | Freq: Every day | ORAL | 1 refills | Status: DC
  Filled 2021-08-19: qty 90, 90d supply, fill #0

## 2021-08-19 MED FILL — Lansoprazole Cap Delayed Release 30 MG: ORAL | 30 days supply | Qty: 30 | Fill #3 | Status: AC

## 2021-08-19 MED FILL — Metoprolol Tartrate Tab 50 MG: ORAL | 30 days supply | Qty: 60 | Fill #3 | Status: AC

## 2021-08-19 MED FILL — Levothyroxine Sodium Tab 100 MCG: ORAL | 30 days supply | Qty: 30 | Fill #6 | Status: AC

## 2021-08-19 MED FILL — Pravastatin Sodium Tab 40 MG: ORAL | 30 days supply | Qty: 30 | Fill #3 | Status: AC

## 2021-08-20 ENCOUNTER — Other Ambulatory Visit: Payer: Self-pay

## 2021-09-03 ENCOUNTER — Ambulatory Visit: Payer: PPO | Admitting: Dermatology

## 2021-09-03 ENCOUNTER — Encounter: Payer: Self-pay | Admitting: Dermatology

## 2021-09-03 DIAGNOSIS — D1800 Hemangioma unspecified site: Secondary | ICD-10-CM | POA: Diagnosis not present

## 2021-09-03 DIAGNOSIS — L918 Other hypertrophic disorders of the skin: Secondary | ICD-10-CM

## 2021-09-03 DIAGNOSIS — L820 Inflamed seborrheic keratosis: Secondary | ICD-10-CM | POA: Diagnosis not present

## 2021-09-03 DIAGNOSIS — L821 Other seborrheic keratosis: Secondary | ICD-10-CM

## 2021-09-03 DIAGNOSIS — Z1283 Encounter for screening for malignant neoplasm of skin: Secondary | ICD-10-CM | POA: Diagnosis not present

## 2021-09-03 DIAGNOSIS — Z86018 Personal history of other benign neoplasm: Secondary | ICD-10-CM

## 2021-09-03 DIAGNOSIS — L814 Other melanin hyperpigmentation: Secondary | ICD-10-CM

## 2021-09-03 DIAGNOSIS — L578 Other skin changes due to chronic exposure to nonionizing radiation: Secondary | ICD-10-CM | POA: Diagnosis not present

## 2021-09-03 DIAGNOSIS — L817 Pigmented purpuric dermatosis: Secondary | ICD-10-CM

## 2021-09-03 DIAGNOSIS — D229 Melanocytic nevi, unspecified: Secondary | ICD-10-CM

## 2021-09-03 NOTE — Progress Notes (Signed)
Follow-Up Visit   Subjective  Marie Colon is a 66 y.o. female who presents for the following: Annual Exam (Hx AK's, dysplastic nevi - patient has noticed new irregular skin lesions on the legs, arms, and back that she is concerned about and would like checked today). The patient presents for Total-Body Skin Exam (TBSE) for skin cancer screening and mole check.  The patient has spots, moles and lesions to be evaluated, some may be new or changing and the patient has concerns that these could be cancer.  The following portions of the chart were reviewed this encounter and updated as appropriate:   Tobacco  Allergies  Meds  Problems  Med Hx  Surg Hx  Fam Hx     Review of Systems:  No other skin or systemic complaints except as noted in HPI or Assessment and Plan.  Objective  Well appearing patient in no apparent distress; mood and affect are within normal limits.  A full examination was performed including scalp, head, eyes, ears, nose, lips, neck, chest, axillae, abdomen, back, buttocks, bilateral upper extremities, bilateral lower extremities, hands, feet, fingers, toes, fingernails, and toenails. All findings within normal limits unless otherwise noted below.  R ant thigh x 1, back x 1, scalp x 3 (5) Erythematous stuck-on, waxy papule or plaque   Assessment & Plan  Inflamed seborrheic keratosis (5) R ant thigh x 1, back x 1, scalp x 3 Symptomatic, irritating, patient would like treated. 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  She has had these elements prior to this occurring Continue Mometasone 0.1% cream to aa's QD-BID until resolved.    Topical steroids (such as triamcinolone, fluocinolone, fluocinonide, mometasone, clobetasol, halobetasol, betamethasone, hydrocortisone) can cause thinning and lightening of the skin if they are used for too long in the same area. Your physician has selected the right strength medicine for your problem and area affected on the body. Please use your medication only as directed by your physician to prevent side effects.   Recommend compression stockings daily.  Related Medications mometasone (ELOCON) 0.1 % cream Apply to affected areas/rash on lower legs once to twice a day.  Avoid face, groin, and axilla.  Lentigines - Scattered tan macules - Due to sun exposure - Benign-appearing, observe - Recommend daily broad spectrum sunscreen SPF 30+ to sun-exposed areas, reapply every 2 hours as needed. - Call for any changes  Seborrheic Keratoses - Stuck-on, waxy, tan-brown papules and/or plaques  - Benign-appearing - Discussed benign etiology and prognosis. - Observe - Call for any changes  Melanocytic Nevi - Tan-brown and/or pink-flesh-colored symmetric macules and papules - Benign appearing on exam today - Observation - Call clinic for new or changing moles - Recommend daily use of broad spectrum spf 30+ sunscreen to sun-exposed areas.   Hemangiomas - Red papules - Discussed benign nature - Observe - Call for any changes  Actinic Damage - Chronic condition, secondary to cumulative UV/sun  exposure - diffuse scaly erythematous macules with underlying dyspigmentation - Recommend daily broad spectrum sunscreen SPF 30+ to sun-exposed areas, reapply every 2 hours as needed.  - Staying in the shade or wearing long sleeves, sun glasses (UVA+UVB protection)  and wide brim hats (4-inch brim around the entire circumference of the hat) are also recommended for sun protection.  - Call for new or changing lesions.  History of Dysplastic Nevi - No evidence of recurrence today - Recommend regular full body skin exams - Recommend daily broad spectrum sunscreen SPF 30+ to sun-exposed areas, reapply every 2 hours as needed.  - Call if any new or changing lesions are noted between office visits  Acrochordons (Skin Tags) - axillary area - Fleshy, skin-colored pedunculated papules - Benign appearing.  - Observe. - If desired, they can be removed with an in office procedure that is not covered by insurance. - Please call the clinic if you notice any new or changing lesions.  Skin cancer screening performed today.  Return in about 6 months (around 03/06/2022) for ISK follow up .  Luther Redo, CMA, am acting as scribe for Sarina Ser, MD . Documentation: I have reviewed the above documentation for accuracy and completeness, and I agree with the above.  Sarina Ser, MD

## 2021-09-03 NOTE — Patient Instructions (Signed)
Due to recent changes in healthcare laws, you may see results of your pathology and/or laboratory studies on MyChart before the doctors have had a chance to review them. We understand that in some cases there may be results that are confusing or concerning to you. Please understand that not all results are received at the same time and often the doctors may need to interpret multiple results in order to provide you with the best plan of care or course of treatment. Therefore, we ask that you please give us 2 business days to thoroughly review all your results before contacting the office for clarification. Should we see a critical lab result, you will be contacted sooner.   If You Need Anything After Your Visit  If you have any questions or concerns for your doctor, please call our main line at [phone] and press option 4 to reach your doctor's medical assistant. If no one answers, please leave a voicemail as directed and we will return your call as soon as possible. Messages left after 4 pm will be answered the following business day.   You may also send us a message via MyChart. We typically respond to MyChart messages within 1-2 business days.  For prescription refills, please ask your pharmacy to contact our office. Our fax number is [phone].  If you have an urgent issue when the clinic is closed that cannot wait until the next business day, you can page your doctor at the number below.    Please note that while we do our best to be available for urgent issues outside of office hours, we are not available 24/7.   If you have an urgent issue and are unable to reach us, you may choose to seek medical care at your doctor's office, retail clinic, urgent care center, or emergency room.  If you have a medical emergency, please immediately call 911 or go to the emergency department.  Pager Numbers  - Dr. Kowalski: [phone]  - Dr. Moye: [phone]  - Dr. Stewart:  [phone]  In the event of inclement weather, please call our main line at [phone] for an update on the status of any delays or closures.  Dermatology Medication Tips: Please keep the boxes that topical medications come in in order to help keep track of the instructions about where and how to use these. Pharmacies typically print the medication instructions only on the boxes and not directly on the medication tubes.   If your medication is too expensive, please contact our office at [phone] option 4 or send us a message through MyChart.   We are unable to tell what your co-pay for medications will be in advance as this is different depending on your insurance coverage. However, we may be able to find a substitute medication at lower cost or fill out paperwork to get insurance to cover a needed medication.   If a prior authorization is required to get your medication covered by your insurance company, please allow us 1-2 business days to complete this process.  Drug prices often vary depending on where the prescription is filled and some pharmacies may offer cheaper prices.  The website www.goodrx.com contains coupons for medications through different pharmacies. The prices here do not account for what the cost may be with help from insurance (it may be cheaper with your insurance), but the website can give you the price if you did not use any insurance.  - You can print the associated coupon and take it with   your prescription to the pharmacy.  - You may also stop by our office during regular business hours and pick up a GoodRx coupon card.  - If you need your prescription sent electronically to a different pharmacy, notify our office through [facility] MyChart or by phone at [phone] option 4.     Si Usted Necesita Algo Despus de Su Visita  Tambin puede enviarnos un mensaje a travs de MyChart. Por lo general respondemos a los mensajes de MyChart en el transcurso de 1 a 2  das hbiles.  Para renovar recetas, por favor pida a su farmacia que se ponga en contacto con nuestra oficina. Nuestro nmero de fax es el [phone].  Si tiene un asunto urgente cuando la clnica est cerrada y que no puede esperar hasta el siguiente da hbil, puede llamar/localizar a su doctor(a) al nmero que aparece a continuacin.   Por favor, tenga en cuenta que aunque hacemos todo lo posible para estar disponibles para asuntos urgentes fuera del horario de oficina, no estamos disponibles las 24 horas del da, los 7 das de la semana.   Si tiene un problema urgente y no puede comunicarse con nosotros, puede optar por buscar atencin mdica  en el consultorio de su doctor(a), en una clnica privada, en un centro de atencin urgente o en una sala de emergencias.  Si tiene una emergencia mdica, por favor llame inmediatamente al 911 o vaya a la sala de emergencias.  Nmeros de bper  - Dr. Kowalski: [phone]  - Dra. Moye: [phone]  - Dra. Stewart: [phone]  En caso de inclemencias del tiempo, por favor llame a nuestra lnea principal al [phone] para una actualizacin sobre el estado de cualquier retraso o cierre.  Consejos para la medicacin en dermatologa: Por favor, guarde las cajas en las que vienen los medicamentos de uso tpico para ayudarle a seguir las instrucciones sobre dnde y cmo usarlos. Las farmacias generalmente imprimen las instrucciones del medicamento slo en las cajas y no directamente en los tubos del medicamento.   Si su medicamento es muy caro, por favor, pngase en contacto con nuestra oficina llamando al [phone] y presione la opcin 4 o envenos un mensaje a travs de MyChart.   No podemos decirle cul ser su copago por los medicamentos por adelantado ya que esto es diferente dependiendo de la cobertura de su seguro. Sin embargo, es posible que podamos encontrar un medicamento sustituto a menor costo o llenar un formulario para que el  seguro cubra el medicamento que se considera necesario.   Si se requiere una autorizacin previa para que su compaa de seguros cubra su medicamento, por favor permtanos de 1 a 2 das hbiles para completar este proceso.  Los precios de los medicamentos varan con frecuencia dependiendo del lugar de dnde se surte la receta y alguna farmacias pueden ofrecer precios ms baratos.  El sitio web www.goodrx.com tiene cupones para medicamentos de diferentes farmacias. Los precios aqu no tienen en cuenta lo que podra costar con la ayuda del seguro (puede ser ms barato con su seguro), pero el sitio web puede darle el precio si no utiliz ningn seguro.  - Puede imprimir el cupn correspondiente y llevarlo con su receta a la farmacia.  - Tambin puede pasar por nuestra oficina durante el horario de atencin regular y recoger una tarjeta de cupones de GoodRx.  - Si necesita que su receta se enve electrnicamente a una farmacia diferente, informe a nuestra oficina a travs de MyChart de [place]   o por telfono llamando al [phone] y presione la opcin 4.  

## 2021-09-04 ENCOUNTER — Other Ambulatory Visit: Payer: Self-pay

## 2021-09-04 DIAGNOSIS — I25118 Atherosclerotic heart disease of native coronary artery with other forms of angina pectoris: Secondary | ICD-10-CM | POA: Diagnosis not present

## 2021-09-04 DIAGNOSIS — I2510 Atherosclerotic heart disease of native coronary artery without angina pectoris: Secondary | ICD-10-CM | POA: Insufficient documentation

## 2021-09-04 DIAGNOSIS — R0602 Shortness of breath: Secondary | ICD-10-CM | POA: Diagnosis not present

## 2021-09-04 DIAGNOSIS — E7800 Pure hypercholesterolemia, unspecified: Secondary | ICD-10-CM | POA: Diagnosis not present

## 2021-09-04 DIAGNOSIS — I10 Essential (primary) hypertension: Secondary | ICD-10-CM | POA: Diagnosis not present

## 2021-09-04 MED ORDER — ATORVASTATIN CALCIUM 20 MG PO TABS
ORAL_TABLET | ORAL | 11 refills | Status: DC
  Filled 2021-09-04: qty 30, 30d supply, fill #0
  Filled 2021-09-24: qty 30, 30d supply, fill #1
  Filled 2021-10-21: qty 30, 30d supply, fill #2

## 2021-09-18 DIAGNOSIS — I25118 Atherosclerotic heart disease of native coronary artery with other forms of angina pectoris: Secondary | ICD-10-CM | POA: Diagnosis not present

## 2021-09-18 DIAGNOSIS — R0602 Shortness of breath: Secondary | ICD-10-CM | POA: Diagnosis not present

## 2021-09-24 ENCOUNTER — Other Ambulatory Visit: Payer: Self-pay | Admitting: Internal Medicine

## 2021-09-24 ENCOUNTER — Other Ambulatory Visit: Payer: Self-pay

## 2021-09-24 MED ORDER — LEVOTHYROXINE SODIUM 100 MCG PO TABS
ORAL_TABLET | Freq: Every day | ORAL | 2 refills | Status: DC
  Filled 2021-09-24: qty 90, 90d supply, fill #0
  Filled 2021-11-11 – 2021-12-23 (×2): qty 90, 90d supply, fill #1
  Filled 2022-02-18 – 2022-03-05 (×2): qty 90, 90d supply, fill #2

## 2021-09-24 MED FILL — Metoprolol Tartrate Tab 50 MG: ORAL | 30 days supply | Qty: 60 | Fill #4 | Status: AC

## 2021-09-24 MED FILL — Lansoprazole Cap Delayed Release 30 MG: ORAL | 30 days supply | Qty: 30 | Fill #4 | Status: AC

## 2021-09-25 ENCOUNTER — Other Ambulatory Visit: Payer: Self-pay

## 2021-09-26 ENCOUNTER — Other Ambulatory Visit: Payer: Self-pay

## 2021-09-30 ENCOUNTER — Other Ambulatory Visit: Payer: Self-pay

## 2021-10-03 ENCOUNTER — Other Ambulatory Visit: Payer: Self-pay

## 2021-10-03 DIAGNOSIS — I10 Essential (primary) hypertension: Secondary | ICD-10-CM | POA: Diagnosis not present

## 2021-10-03 DIAGNOSIS — I2510 Atherosclerotic heart disease of native coronary artery without angina pectoris: Secondary | ICD-10-CM | POA: Diagnosis not present

## 2021-10-03 DIAGNOSIS — R0602 Shortness of breath: Secondary | ICD-10-CM | POA: Diagnosis not present

## 2021-10-03 DIAGNOSIS — E7800 Pure hypercholesterolemia, unspecified: Secondary | ICD-10-CM | POA: Diagnosis not present

## 2021-10-03 MED ORDER — ATORVASTATIN CALCIUM 40 MG PO TABS
ORAL_TABLET | ORAL | 4 refills | Status: DC
Start: 2021-10-03 — End: 2023-01-12
  Filled 2021-10-23: qty 90, 90d supply, fill #0
  Filled 2022-01-12: qty 90, 90d supply, fill #1
  Filled 2022-02-18 – 2022-04-14 (×2): qty 90, 90d supply, fill #2
  Filled 2022-07-05: qty 90, 90d supply, fill #3
  Filled 2022-08-12 – 2022-09-29 (×2): qty 90, 90d supply, fill #4

## 2021-10-03 MED ORDER — METOPROLOL TARTRATE 25 MG PO TABS
25.0000 mg | ORAL_TABLET | Freq: Two times a day (BID) | ORAL | 4 refills | Status: DC
  Filled 2021-10-21: qty 180, 90d supply, fill #0
  Filled 2022-01-12: qty 180, 90d supply, fill #1
  Filled 2022-02-18 – 2022-04-14 (×2): qty 180, 90d supply, fill #2
  Filled 2022-07-05: qty 180, 90d supply, fill #3
  Filled 2022-08-12 – 2022-09-29 (×2): qty 180, 90d supply, fill #4

## 2021-10-21 ENCOUNTER — Other Ambulatory Visit: Payer: Self-pay

## 2021-10-21 MED FILL — Lansoprazole Cap Delayed Release 30 MG: ORAL | 90 days supply | Qty: 90 | Fill #5 | Status: AC

## 2021-10-23 ENCOUNTER — Other Ambulatory Visit: Payer: Self-pay

## 2021-10-23 ENCOUNTER — Other Ambulatory Visit (INDEPENDENT_AMBULATORY_CARE_PROVIDER_SITE_OTHER): Payer: PPO

## 2021-10-23 DIAGNOSIS — K760 Fatty (change of) liver, not elsewhere classified: Secondary | ICD-10-CM

## 2021-10-23 DIAGNOSIS — E7800 Pure hypercholesterolemia, unspecified: Secondary | ICD-10-CM | POA: Diagnosis not present

## 2021-10-23 DIAGNOSIS — R944 Abnormal results of kidney function studies: Secondary | ICD-10-CM

## 2021-10-23 DIAGNOSIS — R739 Hyperglycemia, unspecified: Secondary | ICD-10-CM | POA: Diagnosis not present

## 2021-10-23 LAB — LIPID PANEL
Cholesterol: 144 mg/dL (ref 0–200)
HDL: 37.3 mg/dL — ABNORMAL LOW (ref >39.00)
LDL Cholesterol: 74 mg/dL (ref 0–99)
NonHDL: 106.8
Total CHOL/HDL Ratio: 4
Triglycerides: 166 mg/dL — ABNORMAL HIGH (ref 0.0–149.0)
VLDL: 33.2 mg/dL (ref 0.0–40.0)

## 2021-10-23 LAB — CBC WITH DIFFERENTIAL/PLATELET
Basophils Absolute: 0 10*3/uL (ref 0.0–0.1)
Basophils Relative: 0.5 % (ref 0.0–3.0)
Eosinophils Absolute: 0.2 10*3/uL (ref 0.0–0.7)
Eosinophils Relative: 2.9 % (ref 0.0–5.0)
HCT: 39.7 % (ref 36.0–46.0)
Hemoglobin: 13.4 g/dL (ref 12.0–15.0)
Lymphocytes Relative: 22.3 % (ref 12.0–46.0)
Lymphs Abs: 1.6 10*3/uL (ref 0.7–4.0)
MCHC: 33.9 g/dL (ref 30.0–36.0)
MCV: 90.1 fl (ref 78.0–100.0)
Monocytes Absolute: 0.6 10*3/uL (ref 0.1–1.0)
Monocytes Relative: 8 % (ref 3.0–12.0)
Neutro Abs: 4.8 10*3/uL (ref 1.4–7.7)
Neutrophils Relative %: 66.3 % (ref 43.0–77.0)
Platelets: 165 10*3/uL (ref 150.0–400.0)
RBC: 4.4 Mil/uL (ref 3.87–5.11)
RDW: 14 % (ref 11.5–15.5)
WBC: 7.2 10*3/uL (ref 4.0–10.5)

## 2021-10-23 LAB — HEPATIC FUNCTION PANEL
ALT: 27 U/L (ref 0–35)
AST: 23 U/L (ref 0–37)
Albumin: 4.1 g/dL (ref 3.5–5.2)
Alkaline Phosphatase: 81 U/L (ref 39–117)
Bilirubin, Direct: 0.1 mg/dL (ref 0.0–0.3)
Total Bilirubin: 0.5 mg/dL (ref 0.2–1.2)
Total Protein: 6.3 g/dL (ref 6.0–8.3)

## 2021-10-23 LAB — BASIC METABOLIC PANEL
BUN: 12 mg/dL (ref 6–23)
CO2: 31 mEq/L (ref 19–32)
Calcium: 9.6 mg/dL (ref 8.4–10.5)
Chloride: 104 mEq/L (ref 96–112)
Creatinine, Ser: 1.14 mg/dL (ref 0.40–1.20)
GFR: 50.23 mL/min — ABNORMAL LOW (ref >60.00)
Glucose, Bld: 90 mg/dL (ref 70–99)
Potassium: 3.8 mEq/L (ref 3.5–5.1)
Sodium: 143 mEq/L (ref 135–145)

## 2021-10-23 LAB — HEMOGLOBIN A1C: Hgb A1c MFr Bld: 6 % (ref 4.6–6.5)

## 2021-10-27 ENCOUNTER — Ambulatory Visit (INDEPENDENT_AMBULATORY_CARE_PROVIDER_SITE_OTHER): Payer: PPO | Admitting: Internal Medicine

## 2021-10-27 ENCOUNTER — Encounter: Payer: Self-pay | Admitting: Internal Medicine

## 2021-10-27 VITALS — BP 126/70 | HR 65 | Temp 98.2°F | Ht 63.0 in | Wt 172.8 lb

## 2021-10-27 DIAGNOSIS — E220 Acromegaly and pituitary gigantism: Secondary | ICD-10-CM

## 2021-10-27 DIAGNOSIS — K760 Fatty (change of) liver, not elsewhere classified: Secondary | ICD-10-CM | POA: Diagnosis not present

## 2021-10-27 DIAGNOSIS — R739 Hyperglycemia, unspecified: Secondary | ICD-10-CM

## 2021-10-27 DIAGNOSIS — E039 Hypothyroidism, unspecified: Secondary | ICD-10-CM

## 2021-10-27 DIAGNOSIS — R208 Other disturbances of skin sensation: Secondary | ICD-10-CM

## 2021-10-27 DIAGNOSIS — D352 Benign neoplasm of pituitary gland: Secondary | ICD-10-CM | POA: Diagnosis not present

## 2021-10-27 DIAGNOSIS — Z83719 Family history of colon polyps, unspecified: Secondary | ICD-10-CM

## 2021-10-27 DIAGNOSIS — Z7185 Encounter for immunization safety counseling: Secondary | ICD-10-CM

## 2021-10-27 DIAGNOSIS — Z23 Encounter for immunization: Secondary | ICD-10-CM

## 2021-10-27 DIAGNOSIS — I10 Essential (primary) hypertension: Secondary | ICD-10-CM

## 2021-10-27 DIAGNOSIS — E7800 Pure hypercholesterolemia, unspecified: Secondary | ICD-10-CM | POA: Diagnosis not present

## 2021-10-27 NOTE — Progress Notes (Unsigned)
Patient ID: Marie Colon, female   DOB: 1955-09-25, 66 y.o.   MRN: [id number]   Subjective:    Patient ID: Marie Colon, female    DOB: [birth year], 66 y.o.   MRN: [id number]   Patient here for  Chief Complaint  Patient presents with  . Follow-up    4 month follow up   .   HPI Here to follow up regarding her blood pressure , blood sugar and cholesterol.  Recently saw Dr Nehemiah Massed - calcium score 71.9.  no changes made.    Past Medical History:  Diagnosis Date  . Acromegaly ([place])   . Actinic keratosis   . Angiomyolipoma of kidney    evaluated by Dr Bernardo Heater  . Fatty liver   . GERD (gastroesophageal reflux disease)   . Heart murmur    per Dr. Einar Pheasant  . History of pyelonephritis   . History of ulcerative colitis   . Hx of dysplastic nevus 04/21/2016   L upper back, moderate to severe atypia  . Hypercholesterolemia   . Hypertension   . Hypothyroidism   . Pituitary macroadenoma ([facility])    s/p removal (elevated growth hormone)  . Tubal pregnancy    s/p rupture  . Wears glasses    Past Surgical History:  Procedure Laterality Date  . ABDOMINAL HYSTERECTOMY  2003   abnormal uterine bleeding  . APPENDECTOMY    . CHOLECYSTECTOMY  2005  . COLONOSCOPY WITH PROPOFOL N/A 03/18/2017   Procedure: COLONOSCOPY WITH PROPOFOL;  Surgeon: Lollie Sails, MD;  Location: [facility] ENDOSCOPY;  Service: Endoscopy;  Laterality: N/A;  . DILATION AND CURETTAGE OF UTERUS    . MASS EXCISION Right 08/29/2015   Procedure: EXCISION OF RIGHT THIGH MASS SUBFASICAL 15 CM;  Surgeon: Stark Klein, MD;  Location: [place];  Service: General;  Laterality: Right;  . pituitary tumor removal  2006   Family History  Problem Relation Age of Onset  . Brain cancer Father        died - 43  . Diabetes Mother   . Hypothyroidism Sister   . Breast cancer Sister 37  . Sjogren's syndrome Sister   . Breast cancer Sister 53  . Diabetes Other        grandmother   Social History   Socioeconomic History  .  Marital status: Married    Spouse name: Not on file  . Number of children: 2  . Years of education: Not on file  . Highest education level: Not on file  Occupational History    Employer: [employer]    Comment: [employer] - [employer]  Tobacco Use  . Smoking status: Never  . Smokeless tobacco: Never  Vaping Use  . Vaping Use: Never used  Substance and Sexual Activity  . Alcohol use: No    Alcohol/week: 0.0 standard drinks of alcohol  . Drug use: No  . Sexual activity: Not on file  Other Topics Concern  . Not on file  Social History Narrative  . Not on file   Social Determinants of Health   Financial Resource Strain: Not on file  Food Insecurity: Not on file  Transportation Needs: Not on file  Physical Activity: Not on file  Stress: Not on file  Social Connections: Not on file     Review of Systems     Objective:     BP 126/70 (BP Location: Left Arm, Patient Position: Sitting, Cuff Size: Normal)   Pulse 65   Temp 98.2 F (36.8 C) (Oral)  Ht '5\' 3"'$  (1.6 m)   Wt 172 lb 12.8 oz (78.4 kg)   SpO2 98%   BMI 30.61 kg/m  Wt Readings from Last 3 Encounters:  10/27/21 172 lb 12.8 oz (78.4 kg)  06/18/21 171 lb 3.2 oz (77.7 kg)  02/17/21 170 lb 6.4 oz (77.3 kg)    Physical Exam   Outpatient Encounter Medications as of 10/27/2021  Medication Sig  . allopurinol (ZYLOPRIM) 100 MG tablet TAKE 1 TABLET BY MOUTH DAILY.  Marland Kitchen aspirin EC 81 MG tablet Take 81 mg by mouth daily.  Marland Kitchen atorvastatin (LIPITOR) 20 MG tablet Take 1 tablet (20 mg total) by mouth once daily  . cabergoline (DOSTINEX) 0.5 MG tablet Take 0.5 tablets (0.25 mg total) by mouth once daily  . COVID-19 mRNA bivalent vaccine, Pfizer, (PFIZER COVID-19 VAC BIVALENT) injection Inject into the muscle.  . lansoprazole (PREVACID) 30 MG capsule TAKE 1 CAPSULE BY MOUTH DAILY AT 12 NOON.  Marland Kitchen levothyroxine (SYNTHROID) 100 MCG tablet TAKE 1 TABLET BY MOUTH DAILY BEFORE BREAKFAST.  . metoprolol tartrate (LOPRESSOR) 25 MG tablet Take 1 tablet  (25 mg total) by mouth 2 (two) times daily  . METRONIDAZOLE, TOPICAL, 0.75 % LOTN Apply 1 application topically at bedtime. qhs to face for Rosacea  . mometasone (ELOCON) 0.1 % cream Apply to affected areas/rash on lower legs once to twice a day.  Avoid face, groin, and axilla.  . Multiple Vitamin (MULTIVITAMIN) tablet Take 1 tablet by mouth daily.  Marland Kitchen atorvastatin (LIPITOR) 40 MG tablet Take 1 tablet (40 mg total) by mouth once daily (Patient not taking: Reported on 10/27/2021)  . cabergoline (DOSTINEX) 0.5 MG tablet Take 1/2 tablets (0.25 mg total) by mouth once daily  . metoprolol tartrate (LOPRESSOR) 50 MG tablet TAKE 1 TABLET BY MOUTH TWICE DAILY (Patient not taking: Reported on 10/27/2021)  . pravastatin (PRAVACHOL) 40 MG tablet TAKE 1 TABLET (40 MG TOTAL) BY MOUTH DAILY. (Patient not taking: Reported on 10/27/2021)  . triamterene-hydrochlorothiazide (DYAZIDE) 37.5-25 MG capsule TAKE 1 CAPSULE BY MOUTH DAILY. (Patient not taking: Reported on 10/27/2021)   No facility-administered encounter medications on file as of 10/27/2021.     Lab Results  Component Value Date   WBC 7.2 10/23/2021   HGB 13.4 10/23/2021   HCT 39.7 10/23/2021   PLT 165.0 10/23/2021   GLUCOSE 90 10/23/2021   CHOL 144 10/23/2021   TRIG 166.0 (H) 10/23/2021   HDL 37.30 (L) 10/23/2021   LDLCALC 74 10/23/2021   ALT 27 10/23/2021   AST 23 10/23/2021   NA 143 10/23/2021   K 3.8 10/23/2021   CL 104 10/23/2021   CREATININE 1.14 10/23/2021   BUN 12 10/23/2021   CO2 31 10/23/2021   TSH 2.260 05/22/2021   HGBA1C 6.0 10/23/2021    DG Bone Density  Result Date: 08/12/2021 EXAM: DUAL X-RAY ABSORPTIOMETRY (DXA) FOR BONE MINERAL DENSITY IMPRESSION: Your patient [name] completed a BMD test on 08/12/2021 using the [place] (software version: 14.10) manufactured by [employer]. The following summarizes the results of our evaluation. Technologist: [facility] PATIENT BIOGRAPHICAL: Name: Addisson, Frate Patient ID: [id number] Birth Date: 06/21/1955 Height: 63.0 in. Gender: Female Exam Date: 08/12/2021 Weight: 167.0 lbs. Indications: Caucasian, Hypothyroid, Hysterectomy, Oophorectomy Bilateral, Postmenopausal Fractures: Treatments: Levothyroxine DENSITOMETRY RESULTS: Site      Region        Measured Date Measured Age WHO Classification Young Adult T-score BMD         %Change vs. Previous Significant  Change (*) AP Spine L1-L4 (L2,L3) 8/12/2021 66.1 Normal 0.0 1.183 g/cm2 DualFemur Neck Left 08/12/2021 66.1 Normal -0.7 0.947 g/cm2 ASSESSMENT: The BMD measured at Femur Neck Left is 0.947 g/cm2 with a T-score of -0.7. This patient is considered normal according to [facility]) criteria. The scan quality is good. L-2 & 3 was excluded due to degenerative changes. World Pharmacologist [facility]) criteria for post-menopausal, Caucasian Women: Normal:                   T-score at or above -1 SD Osteopenia/low bone mass: T-score between -1 and -2.5 SD Osteoporosis:             T-score at or below -2.5 SD RECOMMENDATIONS: 1. All patients should optimize calcium and vitamin D intake. 2. Consider FDA-approved medical therapies in postmenopausal women and men aged 59 years and older, based on the following: a. A hip or vertebral(clinical or morphometric) fracture b. T-score < -2.5 at the femoral neck or spine after appropriate evaluation to exclude secondary causes c. Low bone mass (T-score between -1.0 and -2.5 at the femoral neck or spine) and a 10-year probability of a hip fracture > 3% or a 10-year probability of a major osteoporosis-related fracture > 20% based on the [place]-adapted WHO algorithm 3. Clinician judgment and/or patient preferences may indicate treatment for people with 10-year fracture probabilities above or below these levels FOLLOW-UP: People with diagnosed cases of osteoporosis or at high risk for fracture should have regular bone mineral density tests. For patients eligible for Medicare,  routine testing is allowed once every 2 years. The testing frequency can be increased to one year for patients who have rapidly progressing disease, those who are receiving or discontinuing medical therapy to restore bone mass, or have additional risk factors. I have reviewed this report, and agree with the above findings. [facility] Electronically Signed   By: Elmer Picker M.D.   On: 08/12/2021 15:02       Assessment & Plan:   Problem List Items Addressed This Visit   None    Einar Pheasant, MD

## 2021-10-30 ENCOUNTER — Encounter: Payer: Self-pay | Admitting: Internal Medicine

## 2021-10-30 DIAGNOSIS — R208 Other disturbances of skin sensation: Secondary | ICD-10-CM | POA: Insufficient documentation

## 2021-10-30 DIAGNOSIS — Z7185 Encounter for immunization safety counseling: Secondary | ICD-10-CM | POA: Insufficient documentation

## 2021-10-30 NOTE — Assessment & Plan Note (Signed)
Continue metoprolol and triam/hctz.  Blood pressure as outlined.  Have her spot check her pressures. Follow metabolic panel.  

## 2021-10-30 NOTE — Assessment & Plan Note (Signed)
Diet and exercise.  Has seen GI. Follow liver function tests.   

## 2021-10-30 NOTE — Assessment & Plan Note (Signed)
On thyroid replacement.  Follow tsh.  Check with next labs.  

## 2021-10-30 NOTE — Assessment & Plan Note (Signed)
Low carb diet and exercise.  Follow met b and a1c.  

## 2021-10-30 NOTE — Assessment & Plan Note (Signed)
Previous - elevated GH - acromegaly.  Followed by Dr Solum.  On cabergoline.  Recommended f/u in year.  

## 2021-10-30 NOTE — Assessment & Plan Note (Signed)
On pravastatin.  Low cholesterol diet and exercise.  Follow lipid panel and liver function tests.   

## 2021-10-30 NOTE — Assessment & Plan Note (Signed)
prevnar 20 given.

## 2021-10-30 NOTE — Assessment & Plan Note (Signed)
Continues on cabergoline.  Followed by endocrinology.   

## 2021-10-30 NOTE — Assessment & Plan Note (Signed)
Has noticed - wakes up hot.  No sweats.  Discussed.  Check tsh with next labs.  Plans to discuss with endocrinology.

## 2021-10-30 NOTE — Assessment & Plan Note (Signed)
Colonoscopy 08/2017 - one polyp ascending colon.

## 2021-11-11 ENCOUNTER — Other Ambulatory Visit: Payer: Self-pay

## 2021-11-11 MED ORDER — AZITHROMYCIN 500 MG PO TABS
ORAL_TABLET | ORAL | 0 refills | Status: DC
  Filled 2021-11-11: qty 4, 3d supply, fill #0

## 2021-11-12 ENCOUNTER — Other Ambulatory Visit: Payer: Self-pay

## 2021-11-12 MED ORDER — AREXVY 120 MCG/0.5ML IM SUSR
INTRAMUSCULAR | 0 refills | Status: DC
  Filled 2021-11-17: qty 0.5, 1d supply, fill #0

## 2021-11-12 MED ORDER — COVID-19 MRNA 2023-2024 VACCINE (COMIRNATY) 0.3 ML INJECTION
INTRAMUSCULAR | 0 refills | Status: DC
Start: 2021-11-12 — End: 2022-02-27
  Filled 2021-11-17: qty 0.3, 1d supply, fill #0

## 2021-11-17 ENCOUNTER — Other Ambulatory Visit: Payer: Self-pay

## 2021-11-17 DIAGNOSIS — E039 Hypothyroidism, unspecified: Secondary | ICD-10-CM | POA: Diagnosis not present

## 2021-11-17 DIAGNOSIS — R7303 Prediabetes: Secondary | ICD-10-CM | POA: Diagnosis not present

## 2021-11-17 DIAGNOSIS — Z87898 Personal history of other specified conditions: Secondary | ICD-10-CM | POA: Diagnosis not present

## 2021-11-17 DIAGNOSIS — E220 Acromegaly and pituitary gigantism: Secondary | ICD-10-CM | POA: Diagnosis not present

## 2021-11-24 ENCOUNTER — Other Ambulatory Visit: Payer: Self-pay

## 2021-11-24 DIAGNOSIS — E039 Hypothyroidism, unspecified: Secondary | ICD-10-CM | POA: Diagnosis not present

## 2021-11-24 DIAGNOSIS — Z87898 Personal history of other specified conditions: Secondary | ICD-10-CM | POA: Diagnosis not present

## 2021-11-24 DIAGNOSIS — E220 Acromegaly and pituitary gigantism: Secondary | ICD-10-CM | POA: Diagnosis not present

## 2021-11-24 DIAGNOSIS — R7303 Prediabetes: Secondary | ICD-10-CM | POA: Diagnosis not present

## 2021-11-24 MED ORDER — CABERGOLINE 0.5 MG PO TABS
ORAL_TABLET | ORAL | 3 refills | Status: DC
  Filled 2021-11-24 – 2022-01-12 (×2): qty 45, 90d supply, fill #0

## 2021-12-23 ENCOUNTER — Other Ambulatory Visit: Payer: Self-pay

## 2021-12-25 ENCOUNTER — Other Ambulatory Visit: Payer: Self-pay

## 2022-01-12 ENCOUNTER — Other Ambulatory Visit: Payer: Self-pay

## 2022-01-12 MED FILL — Lansoprazole Cap Delayed Release 30 MG: ORAL | 90 days supply | Qty: 90 | Fill #6 | Status: AC

## 2022-01-13 ENCOUNTER — Other Ambulatory Visit: Payer: Self-pay

## 2022-01-14 ENCOUNTER — Other Ambulatory Visit: Payer: Self-pay

## 2022-01-15 ENCOUNTER — Other Ambulatory Visit: Payer: Self-pay

## 2022-02-18 ENCOUNTER — Other Ambulatory Visit: Payer: Self-pay

## 2022-02-18 MED FILL — Lansoprazole Cap Delayed Release 30 MG: ORAL | 30 days supply | Qty: 30 | Fill #7 | Status: AC

## 2022-02-19 ENCOUNTER — Other Ambulatory Visit: Payer: Self-pay

## 2022-02-24 ENCOUNTER — Other Ambulatory Visit: Payer: PPO

## 2022-02-24 ENCOUNTER — Encounter: Payer: Self-pay | Admitting: Family

## 2022-02-24 ENCOUNTER — Ambulatory Visit (INDEPENDENT_AMBULATORY_CARE_PROVIDER_SITE_OTHER): Payer: Medicare HMO | Admitting: Family

## 2022-02-24 ENCOUNTER — Other Ambulatory Visit: Payer: Self-pay

## 2022-02-24 VITALS — BP 130/82 | HR 82 | Temp 100.6°F | Ht 61.0 in | Wt 168.8 lb

## 2022-02-24 DIAGNOSIS — U071 COVID-19: Secondary | ICD-10-CM

## 2022-02-24 MED ORDER — BENZONATATE 100 MG PO CAPS
100.0000 mg | ORAL_CAPSULE | Freq: Three times a day (TID) | ORAL | 1 refills | Status: DC | PRN
  Filled 2022-02-24: qty 20, 7d supply, fill #0
  Filled 2022-03-05: qty 20, 7d supply, fill #1

## 2022-02-24 MED ORDER — NIRMATRELVIR/RITONAVIR (PAXLOVID)TABLET
3.0000 | ORAL_TABLET | Freq: Two times a day (BID) | ORAL | 0 refills | Status: AC
  Filled 2022-02-24 (×3): qty 30, 5d supply, fill #0

## 2022-02-24 NOTE — Assessment & Plan Note (Signed)
Patient nontoxic in appearance.  No acute respiratory distress.   counseled on lacking long term safely and effectiveness data of medication, Paxlovid. Explained EUA for Paxlovid. Criteria met for consideration of Paxlovid,  patient older than 12 years and weight > 40kg, started within 5 days of symptom onset and risk factor for severe disease include: HTN, age > 36   GFR > 60.   Patient is most comfortable and desires to start Paxlovid and understands to call me with concerns or new symptoms.  I provided her with Ladona Ridgel as well.  Advised Mucinex, Delsym acceptable at home as needed

## 2022-02-24 NOTE — Progress Notes (Signed)
Assessment & Plan:  COVID-19 Assessment & Plan: Patient nontoxic in appearance.  No acute respiratory distress.   counseled on lacking long term safely and effectiveness data of medication, Paxlovid. Explained EUA for Paxlovid. Criteria met for consideration of Paxlovid,  patient older than 12 years and weight > 40kg, started within 5 days of symptom onset and risk factor for severe disease include: HTN, age > 52   GFR > 60.   Patient is most comfortable and desires to start Paxlovid and understands to call me with concerns or new symptoms.  I provided her with Ladona Ridgel as well.  Advised Mucinex, Delsym acceptable at home as needed   Orders: -     nirmatrelvir/ritonavir; Take 3 tablets by mouth 2 (two) times daily for 5 days. (Take nirmatrelvir 150 mg two tablets twice daily for 5 days and ritonavir 100 mg one tablet twice daily for 5 days) Patient GFR is 62  Dispense: 30 tablet; Refill: 0 -     Benzonatate; Take 1 capsule (100 mg total) by mouth 3 (three) times daily as needed for cough.  Dispense: 20 capsule; Refill: 1     Return precautions given.   Risks, benefits, and alternatives of the medications and treatment plan prescribed today were discussed, and patient expressed understanding.   Education regarding symptom management and diagnosis given to patient on AVS either electronically or printed.  No follow-ups on file.  Mable Paris, FNP  Subjective:    Patient ID: Marie Colon, female    DOB: 1955/03/08, 67 y.o.   MRN: [id number]  CC: Marie Colon is a 67 y.o. female who presents today for an acute visit.    HPI: Complains of cough and congestion x 1 day.  COVID-positive at home today   low-grade temperature, tmax 100.6.  She is been taking [facility] sinus medication over-the-counter.  Up-to-date on COVID booster.  Previously had COVID and treated with Paxlovid without side effects.  Denies shortness of breath, wheezing.       No h/o  CKD  k Allergies: Protonix [pantoprazole] and Penicillins Current Outpatient Medications on File Prior to Visit  Medication Sig Dispense Refill   allopurinol (ZYLOPRIM) 100 MG tablet TAKE 1 TABLET BY MOUTH DAILY. 90 tablet 3   aspirin EC 81 MG tablet Take 81 mg by mouth daily.     cabergoline (DOSTINEX) 0.5 MG tablet Take 0.5 tablets (0.25 mg total) by mouth once daily 45 tablet 3   cabergoline (DOSTINEX) 0.5 MG tablet Take 0.5 tablets (0.25 mg total) by mouth once daily 45 tablet 3   COVID-19 mRNA bivalent vaccine, Pfizer, (PFIZER COVID-19 VAC BIVALENT) injection Inject into the muscle. 0.3 mL 0   COVID-19 mRNA vaccine 2023-2024 (COMIRNATY) SUSP injection Inject into the muscle. 0.3 mL 0   lansoprazole (PREVACID) 30 MG capsule TAKE 1 CAPSULE BY MOUTH DAILY AT 12 NOON. 90 capsule 3   levothyroxine (SYNTHROID) 100 MCG tablet TAKE 1 TABLET BY MOUTH DAILY BEFORE BREAKFAST. 90 tablet 2   metoprolol tartrate (LOPRESSOR) 25 MG tablet Take 1 tablet (25 mg total) by mouth 2 (two) times daily 180 tablet 4   METRONIDAZOLE, TOPICAL, 0.75 % LOTN Apply 1 application topically at bedtime. qhs to face for Rosacea 60 mL 11   mometasone (ELOCON) 0.1 % cream Apply to affected areas/rash on lower legs once to twice a day.  Avoid face, groin, and axilla. 45 g 0   Multiple Vitamin (MULTIVITAMIN) tablet Take 1 tablet by mouth daily.  RSV vaccine recomb adjuvanted (AREXVY) 120 MCG/0.5ML injection Inject into the muscle. 0.5 mL 0   atorvastatin (LIPITOR) 40 MG tablet Take 1 tablet (40 mg total) by mouth once daily (Patient not taking: Reported on 10/27/2021) 90 tablet 4   No current facility-administered medications on file prior to visit.    Review of Systems  Constitutional:  Negative for chills and fever.  HENT:  Positive for congestion. Negative for sinus pain.   Respiratory:  Positive for cough. Negative for shortness of breath.   Cardiovascular:  Negative for chest pain and palpitations.   Gastrointestinal:  Negative for nausea and vomiting.      Objective:    BP 130/82   Pulse 82   Temp (!) 100.6 F (38.1 C) (Oral)   Ht '5\' 1"'$  (1.549 m)   Wt 168 lb 12.8 oz (76.6 kg)   SpO2 95%   BMI 31.89 kg/m   BP Readings from Last 3 Encounters:  02/24/22 130/82  10/27/21 126/70  06/18/21 126/78   Wt Readings from Last 3 Encounters:  02/24/22 168 lb 12.8 oz (76.6 kg)  10/27/21 172 lb 12.8 oz (78.4 kg)  06/18/21 171 lb 3.2 oz (77.7 kg)    Physical Exam Vitals reviewed.  Constitutional:      Appearance: She is well-developed.  HENT:     Head: Normocephalic and atraumatic.     Right Ear: Hearing, tympanic membrane, ear canal and external ear normal. No decreased hearing noted. No drainage, swelling or tenderness. No middle ear effusion. No foreign body. Tympanic membrane is not erythematous or bulging.     Left Ear: Hearing, tympanic membrane, ear canal and external ear normal. No decreased hearing noted. No drainage, swelling or tenderness.  No middle ear effusion. No foreign body. Tympanic membrane is not erythematous or bulging.     Nose: Nose normal. No rhinorrhea.     Right Sinus: No maxillary sinus tenderness or frontal sinus tenderness.     Left Sinus: No maxillary sinus tenderness or frontal sinus tenderness.     Mouth/Throat:     Pharynx: Uvula midline. No oropharyngeal exudate or posterior oropharyngeal erythema.     Tonsils: No tonsillar abscesses.  Eyes:     Conjunctiva/sclera: Conjunctivae normal.  Cardiovascular:     Rate and Rhythm: Regular rhythm.     Pulses: Normal pulses.     Heart sounds: Normal heart sounds.  Pulmonary:     Effort: Pulmonary effort is normal.     Breath sounds: Normal breath sounds. No wheezing, rhonchi or rales.  Lymphadenopathy:     Head:     Right side of head: No submental, submandibular, tonsillar, preauricular, posterior auricular or occipital adenopathy.     Left side of head: No submental, submandibular, tonsillar,  preauricular, posterior auricular or occipital adenopathy.     Cervical: No cervical adenopathy.  Skin:    General: Skin is warm and dry.  Neurological:     Mental Status: She is alert.  Psychiatric:        Speech: Speech normal.        Behavior: Behavior normal.        Thought Content: Thought content normal.

## 2022-02-24 NOTE — Patient Instructions (Addendum)
You may use Delsym over-the-counter for cough.  may also use Mucinex, plain) to break up thick congestion   Please stay in quarantine per cdc guidelines.   If you test positive for COVID-19, stay home for at least 5 days and isolate from others in your home. You are likely most infectious during these first 5 days. Wear a high-quality mask if you must be around others at home and in public. Do not go places where you are unable to wear a mask.  We discussed starting Paxlovid which is an unapproved drug that is authorized for use under an Emergency Use Authorization.  There are no adequate, approved, available products for the treatment of COVID-19 in adults who have mild-to-moderate COVID-19 and are at high risk for progressing to severe COVID-19, including hospitalization or death.  There are benefits and risks of taking this treatment as outlined in the "Fact Sheet for Patients and Caregivers." You may find this document here and please read in detail   [email or web address]   I have sent Paxlovid to your pharmacy. Please call pharmacy so they bring medication out to your car and you do not have to go inside.   PAXLOVID ADMINISTRATION INSTRUCTIONS:  Take with or without food. Swallow the tablets whole. Don't chew, crush, or break the medications because it might not work as well  For each dose of the medication, you should be taking 3 tablets together (2 pink oval and 1 white oval) TWICE a day for FIVE days   Finish your full five-day course of Paxlovid even if you feel better before you're done. Stopping this medication too early can make it less effective to prevent severe illness related to [place].    Paxlovid is prescribed for YOU ONLY. Don't share it with others, even if they have similar symptoms as you. This medication might not be right for everyone.  Make sure to take steps to protect yourself and others while you're taking this medication in order to get  well soon and to prevent others from getting sick with COVID-19.  Paxlovid (nirmatrelvir / ritonavir) can cause hormonal birth control medications to not work well. If you or your partner is currently taking hormonal birth control, use condoms or other birth control methods to prevent unintended pregnancies.   COMMON SIDE EFFECTS: Altered or bad taste in your mouth  Diarrhea  High blood pressure (1% of people) Muscle aches (1% of people)    If your COVID-19 symptoms get worse, get medical help right away. Call 911 if you experience symptoms such as worsening cough, trouble breathing, chest pain that doesn't go away, confusion, a hard time staying awake, and pale or blue-colored skin.This medication won't prevent all COVID-19 cases from getting worse.

## 2022-02-27 ENCOUNTER — Telehealth (INDEPENDENT_AMBULATORY_CARE_PROVIDER_SITE_OTHER): Payer: Medicare HMO | Admitting: Internal Medicine

## 2022-02-27 ENCOUNTER — Encounter: Payer: Self-pay | Admitting: Internal Medicine

## 2022-02-27 VITALS — HR 55 | Ht 61.0 in | Wt 168.0 lb

## 2022-02-27 DIAGNOSIS — E220 Acromegaly and pituitary gigantism: Secondary | ICD-10-CM

## 2022-02-27 DIAGNOSIS — R739 Hyperglycemia, unspecified: Secondary | ICD-10-CM

## 2022-02-27 DIAGNOSIS — E039 Hypothyroidism, unspecified: Secondary | ICD-10-CM | POA: Diagnosis not present

## 2022-02-27 DIAGNOSIS — D352 Benign neoplasm of pituitary gland: Secondary | ICD-10-CM

## 2022-02-27 DIAGNOSIS — K760 Fatty (change of) liver, not elsewhere classified: Secondary | ICD-10-CM | POA: Diagnosis not present

## 2022-02-27 DIAGNOSIS — E7800 Pure hypercholesterolemia, unspecified: Secondary | ICD-10-CM | POA: Diagnosis not present

## 2022-02-27 DIAGNOSIS — I10 Essential (primary) hypertension: Secondary | ICD-10-CM

## 2022-02-27 DIAGNOSIS — U071 COVID-19: Secondary | ICD-10-CM | POA: Diagnosis not present

## 2022-02-27 NOTE — Progress Notes (Unsigned)
Patient ID: Marie Colon, female   DOB: 11/09/1955, 67 y.o.   MRN: [id number]   Virtual Visit via video Note   All issues noted in this document were discussed and addressed.  No physical exam was performed (except for noted visual exam findings with Video Visits).   I connected withNAME@ today at 10:00 AM EST by a video enabled telemedicine application or telephone and verified that I am speaking with the correct person using two identifiers. Location patient: home Location provider: work  Persons participating in the virtual visit: patient, provider  I discussed the limitations, risks, security and privacy concerns of performing an evaluation and management service by telephone and the availability of in person appointments. I also discussed with the patient that there may be a patient responsible charge related to this service. The patient expressed understanding and agreed to proceed.  Interactive audio and video telecommunications were attempted between this provider and patient, however failed, due to patient having technical difficulties OR patient did not have access to video capability.  We continued and completed visit with audio only. ***  Reason for visit: ***  HPI: Scheduled for f/u of blood pressure, blood sugar and cholesterol.  Saw Dr Gabriel Carina 11/24/21 - f/u history of pituitary tumor/acromegaly. Recommended continuing cabergoliine - IGF-1 level controlled.  Saw Mable Paris 02/24/22 - covid positive 02/24/22.  Treated with paxlovid.     ROS: See pertinent positives and negatives per HPI.  Past Medical History:  Diagnosis Date   Acromegaly ([place])    Actinic keratosis    Angiomyolipoma of kidney    evaluated by Dr Bernardo Heater   Fatty liver    GERD (gastroesophageal reflux disease)    Heart murmur    per Dr. Einar Pheasant   History of pyelonephritis    History of ulcerative colitis    Hx of dysplastic nevus 04/21/2016   L upper back, moderate to severe atypia    Hypercholesterolemia    Hypertension    Hypothyroidism    Pituitary macroadenoma ([place])    s/p removal (elevated growth hormone)   Tubal pregnancy    s/p rupture   Wears glasses     Past Surgical History:  Procedure Laterality Date   ABDOMINAL HYSTERECTOMY  2003   abnormal uterine bleeding   APPENDECTOMY     CHOLECYSTECTOMY  2005   COLONOSCOPY WITH PROPOFOL N/A 03/18/2017   Procedure: COLONOSCOPY WITH PROPOFOL;  Surgeon: Lollie Sails, MD;  Location: [facility] ENDOSCOPY;  Service: Endoscopy;  Laterality: N/A;   DILATION AND CURETTAGE OF UTERUS     MASS EXCISION Right 08/29/2015   Procedure: EXCISION OF RIGHT THIGH MASS SUBFASICAL 15 CM;  Surgeon: Stark Klein, MD;  Location: [place];  Service: General;  Laterality: Right;   pituitary tumor removal  2006    Family History  Problem Relation Age of Onset   Brain cancer Father        died - 60   Diabetes Mother    Hypothyroidism Sister    Breast cancer Sister 4   Sjogren's syndrome Sister    Breast cancer Sister 21   Diabetes Other        grandmother    SOCIAL HX: ***   Current Outpatient Medications:    allopurinol (ZYLOPRIM) 100 MG tablet, TAKE 1 TABLET BY MOUTH DAILY., Disp: 90 tablet, Rfl: 3   aspirin EC 81 MG tablet, Take 81 mg by mouth daily., Disp: , Rfl:    atorvastatin (LIPITOR) 40 MG tablet, Take 1 tablet (  40 mg total) by mouth once daily (Patient not taking: Reported on 10/27/2021), Disp: 90 tablet, Rfl: 4   benzonatate (TESSALON) 100 MG capsule, Take 1 capsule (100 mg total) by mouth 3 (three) times daily as needed for cough., Disp: 20 capsule, Rfl: 1   cabergoline (DOSTINEX) 0.5 MG tablet, Take 0.5 tablets (0.25 mg total) by mouth once daily, Disp: 45 tablet, Rfl: 3   cabergoline (DOSTINEX) 0.5 MG tablet, Take 0.5 tablets (0.25 mg total) by mouth once daily, Disp: 45 tablet, Rfl: 3   COVID-19 mRNA bivalent vaccine, Pfizer, (PFIZER COVID-19 VAC BIVALENT) injection, Inject into the muscle., Disp: 0.3 mL, Rfl: 0    COVID-19 mRNA vaccine 2023-2024 (COMIRNATY) SUSP injection, Inject into the muscle., Disp: 0.3 mL, Rfl: 0   lansoprazole (PREVACID) 30 MG capsule, TAKE 1 CAPSULE BY MOUTH DAILY AT 12 NOON., Disp: 90 capsule, Rfl: 3   levothyroxine (SYNTHROID) 100 MCG tablet, TAKE 1 TABLET BY MOUTH DAILY BEFORE BREAKFAST., Disp: 90 tablet, Rfl: 2   metoprolol tartrate (LOPRESSOR) 25 MG tablet, Take 1 tablet (25 mg total) by mouth 2 (two) times daily, Disp: 180 tablet, Rfl: 4   METRONIDAZOLE, TOPICAL, 0.75 % LOTN, Apply 1 application topically at bedtime. qhs to face for Rosacea, Disp: 60 mL, Rfl: 11   mometasone (ELOCON) 0.1 % cream, Apply to affected areas/rash on lower legs once to twice a day.  Avoid face, groin, and axilla., Disp: 45 g, Rfl: 0   Multiple Vitamin (MULTIVITAMIN) tablet, Take 1 tablet by mouth daily., Disp: , Rfl:    nirmatrelvir/ritonavir (PAXLOVID) 20 x 150 MG & 10 x '100MG'$  TABS, Take 3 tablets by mouth 2 (two) times daily for 5 days. (Take nirmatrelvir 150 mg two tablets twice daily for 5 days and ritonavir 100 mg one tablet twice daily for 5 days) Patient GFR is 62, Disp: 30 tablet, Rfl: 0   RSV vaccine recomb adjuvanted (AREXVY) 120 MCG/0.5ML injection, Inject into the muscle., Disp: 0.5 mL, Rfl: 0  EXAM:  VITALS per patient if applicable:  GENERAL: alert, oriented, appears well and in no acute distress  HEENT: atraumatic, conjunttiva clear, no obvious abnormalities on inspection of external nose and ears  NECK: normal movements of the head and neck  LUNGS: on inspection no signs of respiratory distress, breathing rate appears normal, no obvious gross SOB, gasping or wheezing  CV: no obvious cyanosis  MS: moves all visible extremities without noticeable abnormality  PSYCH/NEURO: pleasant and cooperative, no obvious depression or anxiety, speech and thought processing grossly intact  ASSESSMENT AND PLAN:  Discussed the following assessment and plan:  Problem List Items Addressed  This Visit   None   No follow-ups on file.   I discussed the assessment and treatment plan with the patient. The patient was provided an opportunity to ask questions and all were answered. The patient agreed with the plan and demonstrated an understanding of the instructions.   The patient was advised to call back or seek an in-person evaluation if the symptoms worsen or if the condition fails to improve as anticipated.  I provided *** minutes of non-face-to-face time during this encounter.   Einar Pheasant, MD

## 2022-03-01 ENCOUNTER — Encounter: Payer: Self-pay | Admitting: Internal Medicine

## 2022-03-01 NOTE — Assessment & Plan Note (Signed)
Continue metoprolol and triam/hctz.  Blood pressure as outlined.  Have her spot check her pressures. Follow metabolic panel.

## 2022-03-01 NOTE — Assessment & Plan Note (Signed)
Diet and exercise.  Has seen GI. Follow liver function tests.

## 2022-03-01 NOTE — Assessment & Plan Note (Addendum)
Currently taking paxlovid.  Also taking tessalon perles, delsym and mucinex.  Is doing some better.  No fever x 2 days.  Discussed continued treatment of symptoms.  Can use saline nasal spray/steroid nasal spray if needed.  Follow.  Call with update.  Discussed quarantine guidelines.

## 2022-03-01 NOTE — Assessment & Plan Note (Signed)
Continues on cabergoline.  Followed by endocrinology.

## 2022-03-01 NOTE — Assessment & Plan Note (Signed)
On pravastatin.  Low cholesterol diet and exercise.  Follow lipid panel and liver function tests.   

## 2022-03-01 NOTE — Assessment & Plan Note (Signed)
Previous - elevated GH - acromegaly.  Followed by Dr Gabriel Carina.  On cabergoline.  Recommended f/u in year.

## 2022-03-01 NOTE — Assessment & Plan Note (Addendum)
On thyroid replacement.  Follow tsh.  

## 2022-03-01 NOTE — Assessment & Plan Note (Signed)
Low carb diet and exercise.  Follow met b and a1c.   

## 2022-03-03 ENCOUNTER — Encounter: Payer: Self-pay | Admitting: Internal Medicine

## 2022-03-04 NOTE — Telephone Encounter (Signed)
Please call - glad she is feeling better.  The cough can linger for a while.  If persistent cough, can continue with tessalon perles and/or delsym. If concerns or persistent problems, let us know.

## 2022-03-04 NOTE — Telephone Encounter (Signed)
Pt advised.

## 2022-03-05 ENCOUNTER — Other Ambulatory Visit: Payer: Self-pay

## 2022-03-09 ENCOUNTER — Ambulatory Visit: Payer: PPO | Admitting: Dermatology

## 2022-03-23 ENCOUNTER — Ambulatory Visit (INDEPENDENT_AMBULATORY_CARE_PROVIDER_SITE_OTHER): Payer: Medicare HMO

## 2022-03-23 ENCOUNTER — Ambulatory Visit: Payer: PPO | Admitting: Dermatology

## 2022-03-23 VITALS — Ht 61.0 in | Wt 168.0 lb

## 2022-03-23 DIAGNOSIS — Z0000 Encounter for general adult medical examination without abnormal findings: Secondary | ICD-10-CM

## 2022-03-23 NOTE — Patient Instructions (Addendum)
Marie Colon , Thank you for taking time to come for your Medicare Wellness Visit. I appreciate your ongoing commitment to your health goals. Please review the following plan we discussed and let me know if I can assist you in the future.   These are the goals we discussed:  Goals      Maintain healthy lifestyle     Healthy diet  Stay active        This is a list of the screening recommended for you and due dates:  Health Maintenance  Topic Date Due   COVID-19 Vaccine (6 - 2023-24 season) 04/08/2022*   Mammogram  07/08/2022   Medicare Annual Wellness Visit  03/24/2023   DTaP/Tdap/Td vaccine (3 - Td or Tdap) 05/26/2025   Colon Cancer Screening  03/19/2027   Pneumonia Vaccine  Completed   Flu Shot  Completed   DEXA scan (bone density measurement)  Completed   Hepatitis C Screening: USPSTF Recommendation to screen - Ages 82-79 yo.  Completed   Zoster (Shingles) Vaccine  Completed   HPV Vaccine  Aged Out  *Topic was postponed. The date shown is not the original due date.    Advanced directives: End of life planning; Advance aging; Advanced directives discussed.  Copy of current HCPOA/Living Will requested.    Conditions/risks identified: none new.  Next appointment: Follow up in one year for your annual wellness visit    Preventive Care 65 Years and Older, Female Preventive care refers to lifestyle choices and visits with your health care provider that can promote health and wellness. What does preventive care include? A yearly physical exam. This is also called an annual well check. Dental exams once or twice a year. Routine eye exams. Ask your health care provider how often you should have your eyes checked. Personal lifestyle choices, including: Daily care of your teeth and gums. Regular physical activity. Eating a healthy diet. Avoiding tobacco and drug use. Limiting alcohol use. Practicing safe sex. Taking low-dose aspirin every day. Taking vitamin and mineral  supplements as recommended by your health care provider. What happens during an annual well check? The services and screenings done by your health care provider during your annual well check will depend on your age, overall health, lifestyle risk factors, and family history of disease. Counseling  Your health care provider may ask you questions about your: Alcohol use. Tobacco use. Drug use. Emotional well-being. Home and relationship well-being. Sexual activity. Eating habits. History of falls. Memory and ability to understand (cognition). Work and work Statistician. Reproductive health. Screening  You may have the following tests or measurements: Height, weight, and BMI. Blood pressure. Lipid and cholesterol levels. These may be checked every 5 years, or more frequently if you are over 87 years old. Skin check. Lung cancer screening. You may have this screening every year starting at age 17 if you have a 30-pack-year history of smoking and currently smoke or have quit within the past 15 years. Fecal occult blood test (FOBT) of the stool. You may have this test every year starting at age 37. Flexible sigmoidoscopy or colonoscopy. You may have a sigmoidoscopy every 5 years or a colonoscopy every 10 years starting at age 55. Hepatitis C blood test. Hepatitis B blood test. Sexually transmitted disease (STD) testing. Diabetes screening. This is done by checking your blood sugar (glucose) after you have not eaten for a while (fasting). You may have this done every 1-3 years. Bone density scan. This is done to screen for osteoporosis.  You may have this done starting at age 65. Mammogram. This may be done every 1-2 years. Talk to your health care provider about how often you should have regular mammograms. Talk with your health care provider about your test results, treatment options, and if necessary, the need for more tests. Vaccines  Your health care provider may recommend certain  vaccines, such as: Influenza vaccine. This is recommended every year. Tetanus, diphtheria, and acellular pertussis (Tdap, Td) vaccine. You may need a Td booster every 10 years. Zoster vaccine. You may need this after age 58. Pneumococcal 13-valent conjugate (PCV13) vaccine. One dose is recommended after age 43. Pneumococcal polysaccharide (PPSV23) vaccine. One dose is recommended after age 82. Talk to your health care provider about which screenings and vaccines you need and how often you need them. This information is not intended to replace advice given to you by your health care provider. Make sure you discuss any questions you have with your health care provider. Document Released: 02/08/2015 Document Revised: 10/02/2015 Document Reviewed: 11/13/2014 Elsevier Interactive Patient Education  2017 [place] Prevention in the Home Falls can cause injuries. They can happen to people of all ages. There are many things you can do to make your home safe and to help prevent falls. What can I do on the outside of my home? Regularly fix the edges of walkways and driveways and fix any cracks. Remove anything that might make you trip as you walk through a door, such as a raised step or threshold. Trim any bushes or trees on the path to your home. Use bright outdoor lighting. Clear any walking paths of anything that might make someone trip, such as rocks or tools. Regularly check to see if handrails are loose or broken. Make sure that both sides of any steps have handrails. Any raised decks and porches should have guardrails on the edges. Have any leaves, snow, or ice cleared regularly. Use sand or salt on walking paths during winter. Clean up any spills in your garage right away. This includes oil or grease spills. What can I do in the bathroom? Use night lights. Install grab bars by the toilet and in the tub and shower. Do not use towel bars as grab bars. Use non-skid mats or decals in  the tub or shower. If you need to sit down in the shower, use a plastic, non-slip stool. Keep the floor dry. Clean up any water that spills on the floor as soon as it happens. Remove soap buildup in the tub or shower regularly. Attach bath mats securely with double-sided non-slip rug tape. Do not have throw rugs and other things on the floor that can make you trip. What can I do in the bedroom? Use night lights. Make sure that you have a light by your bed that is easy to reach. Do not use any sheets or blankets that are too big for your bed. They should not hang down onto the floor. Have a firm chair that has side arms. You can use this for support while you get dressed. Do not have throw rugs and other things on the floor that can make you trip. What can I do in the kitchen? Clean up any spills right away. Avoid walking on wet floors. Keep items that you use a lot in easy-to-reach places. If you need to reach something above you, use a strong step stool that has a grab bar. Keep electrical cords out of the way. Do not use  floor polish or wax that makes floors slippery. If you must use wax, use non-skid floor wax. Do not have throw rugs and other things on the floor that can make you trip. What can I do with my stairs? Do not leave any items on the stairs. Make sure that there are handrails on both sides of the stairs and use them. Fix handrails that are broken or loose. Make sure that handrails are as long as the stairways. Check any carpeting to make sure that it is firmly attached to the stairs. Fix any carpet that is loose or worn. Avoid having throw rugs at the top or bottom of the stairs. If you do have throw rugs, attach them to the floor with carpet tape. Make sure that you have a light switch at the top of the stairs and the bottom of the stairs. If you do not have them, ask someone to add them for you. What else can I do to help prevent falls? Wear shoes that: Do not have high  heels. Have rubber bottoms. Are comfortable and fit you well. Are closed at the toe. Do not wear sandals. If you use a stepladder: Make sure that it is fully opened. Do not climb a closed stepladder. Make sure that both sides of the stepladder are locked into place. Ask someone to hold it for you, if possible. Clearly mark and make sure that you can see: Any grab bars or handrails. First and last steps. Where the edge of each step is. Use tools that help you move around (mobility aids) if they are needed. These include: Canes. Walkers. Scooters. Crutches. Turn on the lights when you go into a dark area. Replace any light bulbs as soon as they burn out. Set up your furniture so you have a clear path. Avoid moving your furniture around. If any of your floors are uneven, fix them. If there are any pets around you, be aware of where they are. Review your medicines with your doctor. Some medicines can make you feel dizzy. This can increase your chance of falling. Ask your doctor what other things that you can do to help prevent falls. This information is not intended to replace advice given to you by your health care provider. Make sure you discuss any questions you have with your health care provider. Document Released: 11/08/2008 Document Revised: 06/20/2015 Document Reviewed: 02/16/2014 Elsevier Interactive Patient Education  2017 [employer].

## 2022-03-23 NOTE — Progress Notes (Signed)
Subjective:   Marie Colon is a 67 y.o. female who presents for an Initial Medicare Annual Wellness Visit.  Review of Systems    No ROS.  Medicare Wellness Virtual Visit.  Visual/audio telehealth visit, UTA vital signs.   See social history for additional risk factors.   Cardiac Risk Factors include: advanced age (>84mn, >>84women);hypertension     Objective:    Today's Vitals   03/23/22 1502  Weight: 168 lb (76.2 kg)  Height: '5\' 1"'$  (1.549 m)   Body mass index is 31.74 kg/m.     03/23/2022    3:05 PM 08/16/2020    4:38 PM 04/30/2020    7:22 PM 02/28/2018    8:19 PM 03/18/2017    6:58 AM 08/22/2015    2:48 PM  Advanced Directives  Does Patient Have a Medical Advance Directive? Yes No Yes No Yes Yes  [place] of [place]    Living [place]  Does patient want to make changes to medical advance directive? No - Patient declined     No - Patient declined  Copy of [place] Chart? No - copy requested     No - copy requested    Current Medications (verified) Outpatient Encounter Medications as of 03/23/2022  Medication Sig   allopurinol (ZYLOPRIM) 100 MG tablet TAKE 1 TABLET BY MOUTH DAILY.   aspirin EC 81 MG tablet Take 81 mg by mouth daily.   atorvastatin (LIPITOR) 40 MG tablet Take 1 tablet (40 mg total) by mouth once daily (Patient not taking: Reported on 10/27/2021)   cabergoline (DOSTINEX) 0.5 MG tablet Take 0.5 tablets (0.25 mg total) by mouth once daily   lansoprazole (PREVACID) 30 MG capsule TAKE 1 CAPSULE BY MOUTH DAILY AT 12 NOON.   levothyroxine (SYNTHROID) 100 MCG tablet TAKE 1 TABLET BY MOUTH DAILY BEFORE BREAKFAST.   metoprolol tartrate (LOPRESSOR) 25 MG tablet Take 1 tablet (25 mg total) by mouth 2 (two) times daily   METRONIDAZOLE, TOPICAL, 0.75 % LOTN Apply 1 application topically at bedtime. qhs to face for Rosacea   mometasone (ELOCON) 0.1 % cream Apply  to affected areas/rash on lower legs once to twice a day.  Avoid face, groin, and axilla.   Multiple Vitamin (MULTIVITAMIN) tablet Take 1 tablet by mouth daily.   [DISCONTINUED] benzonatate (TESSALON) 100 MG capsule Take 1 capsule (100 mg total) by mouth 3 (three) times daily as needed for cough.   [DISCONTINUED] cabergoline (DOSTINEX) 0.5 MG tablet Take 0.5 tablets (0.25 mg total) by mouth once daily   No facility-administered encounter medications on file as of 03/23/2022.    Allergies (verified) Protonix [pantoprazole] and Penicillins   History: Past Medical History:  Diagnosis Date   Acromegaly (HHam[place]    Actinic keratosis    Angiomyolipoma of kidney    evaluated by Dr SBernardo Heater  Fatty liver    GERD (gastroesophageal reflux disease)    Heart murmur    per Dr. CEinar Pheasant  History of pyelonephritis    History of ulcerative colitis    Hx of dysplastic nevus 04/21/2016   L upper back, moderate to severe atypia   Hypercholesterolemia    Hypertension    Hypothyroidism    Pituitary macroadenoma (HCC)    s/p removal (elevated growth hormone)   Tubal pregnancy    s/p rupture   Wears glasses    Past Surgical History:  Procedure Laterality Date  ABDOMINAL HYSTERECTOMY  2003   abnormal uterine bleeding   APPENDECTOMY     CHOLECYSTECTOMY  2005   COLONOSCOPY WITH PROPOFOL N/A 03/18/2017   Procedure: COLONOSCOPY WITH PROPOFOL;  Surgeon: Lollie Sails, MD;  Location: [facility];  Service: Endoscopy;  Laterality: N/A;   DILATION AND CURETTAGE OF UTERUS     MASS EXCISION Right 08/29/2015   Procedure: EXCISION OF RIGHT THIGH MASS SUBFASICAL 15 CM;  Surgeon: Stark Klein, MD;  Location: [facility];  Service: General;  Laterality: Right;   pituitary tumor removal  2006   Family History  Problem Relation Age of Onset   Brain cancer Father        died - 66   Diabetes Mother    Hypothyroidism Sister    Breast cancer Sister 70   Sjogren's syndrome Sister    Breast cancer Sister  80   Diabetes Other        grandmother   Social History   Socioeconomic History   Marital status: Married    Spouse name: Not on file   Number of children: 2   Years of education: Not on file   Highest education level: Not on file  Occupational History    Employer: [employer]    Comment: [employer] - [employer]  Tobacco Use   Smoking status: Never   Smokeless tobacco: Never  Vaping Use   Vaping Use: Never used  Substance and Sexual Activity   Alcohol use: No    Alcohol/week: 0.0 standard drinks of alcohol   Drug use: No   Sexual activity: Not on file  Other Topics Concern   Not on file  Social History Narrative   Not on file   Social Determinants of Health   Financial Resource Strain: Low Risk  (03/23/2022)   Overall Financial Resource Strain (CARDIA)    Difficulty of Paying Living Expenses: Not hard at all  Food Insecurity: No Food Insecurity (03/23/2022)   Hunger Vital Sign    Worried About Running Out of Food in the Last Year: Never true    [place] in the Last Year: Never true  Transportation Needs: No Transportation Needs (03/23/2022)   PRAPARE - Hydrologist (Medical): No    Lack of Transportation (Non-Medical): No  Physical Activity: Not on file  Stress: No Stress Concern Present (03/23/2022)   [place]    Feeling of Stress : Not at all  Social Connections: Unknown (03/23/2022)   Social Connection and Isolation Panel [NHANES]    Frequency of Communication with Friends and Family: More than three times a week    Frequency of Social Gatherings with Friends and Family: More than three times a week    Attends Religious Services: Not on Advertising copywriter or Organizations: Not on file    Attends Archivist Meetings: Not on file    Marital Status: Married    Tobacco Counseling Counseling given: Not Answered   Clinical Intake:  Pre-visit preparation  completed: Yes        Diabetes: No              Activities of Daily Living    03/23/2022    3:05 PM  In your present state of health, do you have any difficulty performing the following activities:  Hearing? 0  Vision? 0  Difficulty concentrating or making decisions? 0  Walking or climbing stairs? 0  Dressing or bathing? 0  Doing errands, shopping? 0  Preparing Food and eating ? N  Using the Toilet? N  In the past six months, have you accidently leaked urine? N  Do you have problems with loss of bowel control? N  Managing your Medications? N  Managing your Finances? N  Housekeeping or managing your Housekeeping? N    Patient Care Team: Einar Pheasant, MD as PCP - General (Internal Medicine)  Indicate any recent Medical Services you may have received from other than [facility] providers in the past year (date may be approximate).     Assessment:   This is a routine wellness examination for Marie Colon.  I connected with  Anastasio Champion on 03/23/22 by a audio enabled telemedicine application and verified that I am speaking with the correct person using two identifiers.  Patient Location: Home  Provider Location: Office/Clinic  I discussed the limitations of evaluation and management by telemedicine. The patient expressed understanding and agreed to proceed.   Hearing/Vision screen Hearing Screening - Comments:: Patient is able to hear conversational tones without difficulty.  No issues reported.   Vision Screening - Comments:: Followed by [facility] Wears corrective lenses They have seen their ophthalmologist in the last 12 months.   Dietary issues and exercise activities discussed: Current Exercise Habits: Home exercise routine, Type of exercise: walking, Intensity: Mild   Goals Addressed             This Visit's Progress    Maintain healthy lifestyle       Healthy diet  Stay active       Depression Screen    03/23/2022    3:05 PM  02/24/2022   10:44 AM 10/27/2021    8:54 AM 06/18/2021    8:02 AM 02/17/2021   11:53 AM 01/01/2020    8:11 AM 03/27/2019    8:26 AM  PHQ 2/9 Scores  PHQ - 2 Score 0 0 0 0 0 0 0    Fall Risk    03/23/2022    3:04 PM 02/24/2022   10:44 AM 10/27/2021    8:54 AM 06/18/2021    8:02 AM 02/17/2021   11:53 AM  Fall Risk   Falls in the past year? 1 0 0 0 0  Number falls in past yr: 0 0   0  Injury with Fall? 0 0   0  Risk for fall due to :  No Fall Risks No Fall Risks No Fall Risks No Fall Risks  Follow up Falls evaluation completed;Falls prevention discussed Falls evaluation completed Falls evaluation completed Falls evaluation completed Falls evaluation completed    FALL RISK PREVENTION PERTAINING TO THE HOME: Home free of loose throw rugs in walkways, pet beds, electrical cords, etc? Yes  Adequate lighting in your home to reduce risk of falls? Yes   ASSISTIVE DEVICES UTILIZED TO PREVENT FALLS: Life alert? No  Use of a cane, walker or w/c? No  Grab bars in the bathroom? No  Shower chair or bench in shower? No  Elevated toilet seat or a handicapped toilet? No   TIMED UP AND GO: Was the test performed? No .   Cognitive Function:        03/23/2022    3:13 PM  6CIT Screen  What Year? 0 points  What month? 0 points  What time? 0 points  Count back from 20 0 points  Months in reverse 0 points  Repeat phrase 0 points  Total Score 0 points  Immunizations Immunization History  Administered Date(s) Administered   COVID-19, mRNA, vaccine(Comirnaty)12 years and older 11/17/2021   Fluad Quad(high Dose 65+) 10/27/2021   Hepatitis B 06/26/2008   Influenza Split 10/09/2013   Influenza-Unspecified 10/10/2012, 10/31/2014, 10/30/2015, 10/05/2016, 10/25/2017, 10/17/2018, 10/24/2019   PFIZER(Purple Top)SARS-COV-2 Vaccination 01/23/2019, 02/10/2019, 11/17/2019   PNEUMOCOCCAL CONJUGATE-20 10/27/2021   Pfizer Covid-19 Vaccine Bivalent Booster 24yr & up 11/22/2020   Respiratory Syncytial  Virus Vaccine,Recomb Aduvanted(Arexvy) 11/17/2021   Td 05/27/2015   Tdap 05/26/2005   Zoster Recombinat (Shingrix) 01/23/2017, 04/07/2017   Screening Tests Health Maintenance  Topic Date Due   COVID-19 Vaccine (6 - 2023-24 season) 04/08/2022 (Originally 01/12/2022)   MAMMOGRAM  07/08/2022   Medicare Annual Wellness (AWV)  03/24/2023   DTaP/Tdap/Td (3 - Td or Tdap) 05/26/2025   COLONOSCOPY (Pts 45-416yrInsurance coverage will need to be confirmed)  03/19/2027   Pneumonia Vaccine 6525Years old  Completed   INFLUENZA VACCINE  Completed   DEXA SCAN  Completed   Hepatitis C Screening  Completed   Zoster Vaccines- Shingrix  Completed   HPV VACCINES  Aged Out    Health Maintenance There are no preventive care reminders to display for this patient.  Mammogram- ordered per consent.   Lung Cancer Screening: (Low Dose CT Chest recommended if Age 67-80ears, 30 pack-year currently smoking OR have quit w/in 15years.) does not qualify.   Hepatitis C Screening: Completed 07/2015.   Vision Screening: Recommended annual ophthalmology exams for early detection of glaucoma and other disorders of the eye.  Dental Screening: Recommended annual dental exams for proper oral hygiene  Community Resource Referral / Chronic Care Management: CRR required this visit?  No   CCM required this visit?  No      Plan:     I have personally reviewed and noted the following in the patient's chart:   Medical and social history Use of alcohol, tobacco or illicit drugs  Current medications and supplements including opioid prescriptions. Patient is not currently taking opioid prescriptions. Functional ability and status Nutritional status Physical activity Advanced directives List of other physicians Hospitalizations, surgeries, and ER visits in previous 12 months Vitals Screenings to include cognitive, depression, and falls Referrals and appointments  In addition, I have reviewed and discussed  with patient certain preventive protocols, quality metrics, and best practice recommendations. A written personalized care plan for preventive services as well as general preventive health recommendations were provided to patient.     DeLeta JunglingLPN   [id number]

## 2022-03-24 ENCOUNTER — Other Ambulatory Visit (INDEPENDENT_AMBULATORY_CARE_PROVIDER_SITE_OTHER): Payer: Medicare HMO

## 2022-03-24 DIAGNOSIS — K760 Fatty (change of) liver, not elsewhere classified: Secondary | ICD-10-CM | POA: Diagnosis not present

## 2022-03-24 DIAGNOSIS — R739 Hyperglycemia, unspecified: Secondary | ICD-10-CM

## 2022-03-24 DIAGNOSIS — E7800 Pure hypercholesterolemia, unspecified: Secondary | ICD-10-CM | POA: Diagnosis not present

## 2022-03-24 DIAGNOSIS — E039 Hypothyroidism, unspecified: Secondary | ICD-10-CM

## 2022-03-24 LAB — HEPATIC FUNCTION PANEL
ALT: 32 U/L (ref 0–35)
AST: 23 U/L (ref 0–37)
Albumin: 3.8 g/dL (ref 3.5–5.2)
Alkaline Phosphatase: 90 U/L (ref 39–117)
Bilirubin, Direct: 0.1 mg/dL (ref 0.0–0.3)
Total Bilirubin: 0.5 mg/dL (ref 0.2–1.2)
Total Protein: 6.5 g/dL (ref 6.0–8.3)

## 2022-03-24 LAB — HEMOGLOBIN A1C: Hgb A1c MFr Bld: 6.1 % (ref 4.6–6.5)

## 2022-03-24 LAB — LIPID PANEL
Cholesterol: 161 mg/dL (ref 0–200)
HDL: 39.4 mg/dL (ref >39.00)
LDL Cholesterol: 89 mg/dL (ref 0–99)
NonHDL: 121.57
Total CHOL/HDL Ratio: 4
Triglycerides: 164 mg/dL — ABNORMAL HIGH (ref 0.0–149.0)
VLDL: 32.8 mg/dL (ref 0.0–40.0)

## 2022-03-24 LAB — BASIC METABOLIC PANEL
BUN: 13 mg/dL (ref 6–23)
CO2: 29 mEq/L (ref 19–32)
Calcium: 9.3 mg/dL (ref 8.4–10.5)
Chloride: 105 mEq/L (ref 96–112)
Creatinine, Ser: 1.01 mg/dL (ref 0.40–1.20)
GFR: 57.92 mL/min — ABNORMAL LOW (ref >60.00)
Glucose, Bld: 92 mg/dL (ref 70–99)
Potassium: 3.6 mEq/L (ref 3.5–5.1)
Sodium: 143 mEq/L (ref 135–145)

## 2022-03-24 LAB — TSH: TSH: 5.38 u[IU]/mL (ref 0.35–5.50)

## 2022-04-14 ENCOUNTER — Other Ambulatory Visit: Payer: Self-pay

## 2022-05-14 ENCOUNTER — Other Ambulatory Visit: Payer: Self-pay | Admitting: Internal Medicine

## 2022-05-14 ENCOUNTER — Encounter: Payer: Self-pay | Admitting: Internal Medicine

## 2022-05-14 ENCOUNTER — Other Ambulatory Visit: Payer: Self-pay

## 2022-05-14 DIAGNOSIS — Z1211 Encounter for screening for malignant neoplasm of colon: Secondary | ICD-10-CM

## 2022-05-14 MED ORDER — LANSOPRAZOLE 30 MG PO CPDR
30.0000 mg | DELAYED_RELEASE_CAPSULE | Freq: Every day | ORAL | 3 refills | Status: DC
  Filled 2022-05-14: qty 90, 90d supply, fill #0
  Filled 2022-08-12 (×2): qty 90, 90d supply, fill #1
  Filled 2022-10-15 – 2022-11-10 (×2): qty 90, 90d supply, fill #2
  Filled 2023-01-03: qty 90, 90d supply, fill #3

## 2022-05-16 NOTE — Telephone Encounter (Signed)
Order placed for referral to GI 

## 2022-05-19 DIAGNOSIS — E220 Acromegaly and pituitary gigantism: Secondary | ICD-10-CM | POA: Diagnosis not present

## 2022-05-19 DIAGNOSIS — R7303 Prediabetes: Secondary | ICD-10-CM | POA: Diagnosis not present

## 2022-05-26 ENCOUNTER — Other Ambulatory Visit: Payer: Self-pay

## 2022-05-26 DIAGNOSIS — R7303 Prediabetes: Secondary | ICD-10-CM | POA: Insufficient documentation

## 2022-05-26 DIAGNOSIS — E220 Acromegaly and pituitary gigantism: Secondary | ICD-10-CM | POA: Diagnosis not present

## 2022-05-26 DIAGNOSIS — Z87898 Personal history of other specified conditions: Secondary | ICD-10-CM | POA: Diagnosis not present

## 2022-05-26 MED ORDER — CABERGOLINE 0.5 MG PO TABS
0.2500 mg | ORAL_TABLET | Freq: Every day | ORAL | 3 refills | Status: DC
  Filled 2022-05-26: qty 40, 80d supply, fill #0
  Filled 2022-08-12: qty 40, 80d supply, fill #1
  Filled 2022-10-15: qty 40, 80d supply, fill #2
  Filled 2022-11-06 – 2023-01-03 (×2): qty 40, 80d supply, fill #3

## 2022-05-27 ENCOUNTER — Other Ambulatory Visit: Payer: Self-pay

## 2022-05-27 HISTORY — PX: OTHER SURGICAL HISTORY: SHX169

## 2022-05-28 ENCOUNTER — Other Ambulatory Visit: Payer: Self-pay

## 2022-06-16 ENCOUNTER — Other Ambulatory Visit: Payer: Self-pay

## 2022-06-16 ENCOUNTER — Other Ambulatory Visit: Payer: Self-pay | Admitting: Internal Medicine

## 2022-06-16 MED ORDER — LEVOTHYROXINE SODIUM 100 MCG PO TABS
100.0000 ug | ORAL_TABLET | Freq: Every day | ORAL | 2 refills | Status: DC
  Filled 2022-06-16: qty 90, 90d supply, fill #0
  Filled 2022-08-12: qty 90, 90d supply, fill #1
  Filled 2022-12-09: qty 90, 90d supply, fill #2

## 2022-06-17 ENCOUNTER — Other Ambulatory Visit: Payer: Self-pay

## 2022-07-07 ENCOUNTER — Ambulatory Visit (INDEPENDENT_AMBULATORY_CARE_PROVIDER_SITE_OTHER): Payer: Medicare HMO | Admitting: Internal Medicine

## 2022-07-07 ENCOUNTER — Other Ambulatory Visit: Payer: Self-pay

## 2022-07-07 ENCOUNTER — Encounter: Payer: Self-pay | Admitting: Internal Medicine

## 2022-07-07 VITALS — BP 122/74 | HR 83 | Temp 97.9°F | Resp 16 | Ht 61.0 in | Wt 171.2 lb

## 2022-07-07 DIAGNOSIS — E7800 Pure hypercholesterolemia, unspecified: Secondary | ICD-10-CM | POA: Diagnosis not present

## 2022-07-07 DIAGNOSIS — E039 Hypothyroidism, unspecified: Secondary | ICD-10-CM | POA: Diagnosis not present

## 2022-07-07 DIAGNOSIS — Z0000 Encounter for general adult medical examination without abnormal findings: Secondary | ICD-10-CM | POA: Diagnosis not present

## 2022-07-07 DIAGNOSIS — K760 Fatty (change of) liver, not elsewhere classified: Secondary | ICD-10-CM | POA: Diagnosis not present

## 2022-07-07 DIAGNOSIS — Z83719 Family history of colon polyps, unspecified: Secondary | ICD-10-CM | POA: Diagnosis not present

## 2022-07-07 DIAGNOSIS — Z1211 Encounter for screening for malignant neoplasm of colon: Secondary | ICD-10-CM

## 2022-07-07 DIAGNOSIS — E220 Acromegaly and pituitary gigantism: Secondary | ICD-10-CM | POA: Diagnosis not present

## 2022-07-07 DIAGNOSIS — R739 Hyperglycemia, unspecified: Secondary | ICD-10-CM

## 2022-07-07 DIAGNOSIS — D352 Benign neoplasm of pituitary gland: Secondary | ICD-10-CM | POA: Diagnosis not present

## 2022-07-07 DIAGNOSIS — N644 Mastodynia: Secondary | ICD-10-CM | POA: Insufficient documentation

## 2022-07-07 DIAGNOSIS — Z1231 Encounter for screening mammogram for malignant neoplasm of breast: Secondary | ICD-10-CM

## 2022-07-07 DIAGNOSIS — I10 Essential (primary) hypertension: Secondary | ICD-10-CM | POA: Diagnosis not present

## 2022-07-07 MED ORDER — ALLOPURINOL 100 MG PO TABS
100.0000 mg | ORAL_TABLET | Freq: Every day | ORAL | 3 refills | Status: DC
  Filled 2022-07-07: qty 90, 90d supply, fill #0
  Filled 2022-07-07: qty 90, fill #0
  Filled 2022-07-08: qty 90, 90d supply, fill #0
  Filled 2022-08-12 – 2022-10-15 (×2): qty 90, 90d supply, fill #1
  Filled 2023-01-03: qty 90, 90d supply, fill #2
  Filled 2023-04-05: qty 90, 90d supply, fill #3

## 2022-07-07 NOTE — Assessment & Plan Note (Addendum)
Physical today 07/07/22. PAP 06/18/21 - negative with negative HPV.   Colonoscopy 02/2017 - recommended f/u in 5 years.  (tubular adenoma).  Referral placed for GI. Diagnostic mammogram 07/07/21 - Birads I.  Due f/u mammogram.  With pain left breast as outlined, schedule diagnostic bilateral mammogram and possible ultrasound.

## 2022-07-07 NOTE — Progress Notes (Signed)
Subjective:    Patient ID: Marie Colon, female    DOB: [birth year], 67 y.o.   MRN: [id number]  Patient here for  Chief Complaint  Patient presents with   Annual Exam    HPI Here for physical exam.  Saw Dr Tedd Sias 05/26/22.  Taking and tolerating cabergoline. Stable.  Reports persistent lesion - top of head.  Larger.  Has appt with dermatology 07/22/22.  No chest pain or sob reported.  No abdominal pain.  Some variation with bowels.  Discussed benefiber.  Pain - left breast - 9:00.  Due screening.  Raised lesion - right groin.  Tender. Present over the last month.  No vaginal lesions.     Past Medical History:  Diagnosis Date   Acromegaly (HCC)    Actinic keratosis    Angiomyolipoma of kidney    evaluated by Dr Lonna Cobb   Fatty liver    GERD (gastroesophageal reflux disease)    Heart murmur    per Dr. Dale Canavanas   History of pyelonephritis    History of ulcerative colitis    Hx of dysplastic nevus 04/21/2016   L upper back, moderate to severe atypia   Hypercholesterolemia    Hypertension    Hypothyroidism    Pituitary macroadenoma (HCC)    s/p removal (elevated growth hormone)   Tubal pregnancy    s/p rupture   Wears glasses    Past Surgical History:  Procedure Laterality Date   ABDOMINAL HYSTERECTOMY  2003   abnormal uterine bleeding   APPENDECTOMY     CHOLECYSTECTOMY  2005   COLONOSCOPY WITH PROPOFOL N/A 03/18/2017   Procedure: COLONOSCOPY WITH PROPOFOL;  Surgeon: Christena Deem, MD;  Location: [facility] ENDOSCOPY;  Service: Endoscopy;  Laterality: N/A;   DILATION AND CURETTAGE OF UTERUS     MASS EXCISION Right 08/29/2015   Procedure: EXCISION OF RIGHT THIGH MASS SUBFASICAL 15 CM;  Surgeon: Almond Lint, MD;  Location: [facility] OR;  Service: General;  Laterality: Right;   pituitary tumor removal  2006   Family History  Problem Relation Age of Onset   Brain cancer Father        died - 84   Diabetes Mother    Hypothyroidism Sister    Breast cancer Sister 28    Sjogren's syndrome Sister    Breast cancer Sister 60   Diabetes Other        grandmother   Social History   Socioeconomic History   Marital status: Married    Spouse name: Not on file   Number of children: 2   Years of education: Not on file   Highest education level: Not on file  Occupational History    Employer: [employer]    Comment: [employer] - [employer]  Tobacco Use   Smoking status: Never   Smokeless tobacco: Never  Vaping Use   Vaping Use: Never used  Substance and Sexual Activity   Alcohol use: No    Alcohol/week: 0.0 standard drinks of alcohol   Drug use: No   Sexual activity: Not on file  Other Topics Concern   Not on file  Social History Narrative   Not on file   Social Determinants of Health   Financial Resource Strain: Low Risk  (03/23/2022)   Overall Financial Resource Strain (CARDIA)    Difficulty of Paying Living Expenses: Not hard at all  Food Insecurity: No Food Insecurity (03/23/2022)   Hunger Vital Sign    Worried About Running Out of Food in the  Last Year: Never true    Ran Out of Food in the Last Year: Never true  Transportation Needs: No Transportation Needs (03/23/2022)   PRAPARE - Administrator, Civil Service (Medical): No    Lack of Transportation (Non-Medical): No  Physical Activity: Not on file  Stress: No Stress Concern Present (03/23/2022)   [employer] of Occupational Health - Occupational Stress Questionnaire    Feeling of Stress : Not at all  Social Connections: Unknown (03/23/2022)   Social Connection and Isolation Panel [NHANES]    Frequency of Communication with Friends and Family: More than three times a week    Frequency of Social Gatherings with Friends and Family: More than three times a week    Attends Religious Services: Not on Marketing executive or Organizations: Not on file    Attends Banker Meetings: Not on file    Marital Status: Married     Review of Systems  Constitutional:  Negative  for appetite change and unexpected weight change.  HENT:  Negative for congestion, sinus pressure and sore throat.   Eyes:  Negative for pain and visual disturbance.  Respiratory:  Negative for cough, chest tightness and shortness of breath.   Cardiovascular:  Negative for chest pain, palpitations and leg swelling.  Gastrointestinal:  Negative for abdominal pain, diarrhea, nausea and vomiting.  Genitourinary:  Negative for difficulty urinating and dysuria.  Musculoskeletal:  Negative for joint swelling and myalgias.  Skin:  Negative for color change and rash.  Neurological:  Negative for dizziness and headaches.  Hematological:  Negative for adenopathy. Does not bruise/bleed easily.  Psychiatric/Behavioral:  Negative for agitation and dysphoric mood.        Objective:     BP 122/74   Pulse 83   Temp 97.9 F (36.6 C)   Resp 16   Ht 5\' 1"  (1.549 m)   Wt 171 lb 3.2 oz (77.7 kg)   SpO2 98%   BMI 32.35 kg/m  Wt Readings from Last 3 Encounters:  07/07/22 171 lb 3.2 oz (77.7 kg)  03/23/22 168 lb (76.2 kg)  02/27/22 168 lb (76.2 kg)    Physical Exam Vitals reviewed.  Constitutional:      General: She is not in acute distress.    Appearance: Normal appearance. She is well-developed.  HENT:     Head: Normocephalic and atraumatic.     Right Ear: External ear normal.     Left Ear: External ear normal.  Eyes:     General: No scleral icterus.       Right eye: No discharge.        Left eye: No discharge.     Conjunctiva/sclera: Conjunctivae normal.  Neck:     Thyroid: No thyromegaly.  Cardiovascular:     Rate and Rhythm: Normal rate and regular rhythm.  Pulmonary:     Effort: No tachypnea, accessory muscle usage or respiratory distress.     Breath sounds: Normal breath sounds. No decreased breath sounds or wheezing.     Comments: Left Breast - pain 9:00 region.  Chest:  Breasts:    Right: No inverted nipple, mass, nipple discharge or tenderness (no axillary adenopathy).      Left: No inverted nipple, mass, nipple discharge or tenderness (no axilarry adenopathy).  Abdominal:     General: Bowel sounds are normal.     Palpations: Abdomen is soft.     Tenderness: There is no abdominal tenderness.  Musculoskeletal:  General: No swelling or tenderness.     Cervical back: Neck supple.  Lymphadenopathy:     Cervical: No cervical adenopathy.  Skin:    Findings: No erythema or rash.  Neurological:     Mental Status: She is alert and oriented to person, place, and time.  Psychiatric:        Mood and Affect: Mood normal.        Behavior: Behavior normal.      Outpatient Encounter Medications as of 07/07/2022  Medication Sig   aspirin EC 81 MG tablet Take 81 mg by mouth daily.   atorvastatin (LIPITOR) 40 MG tablet Take 1 tablet (40 mg total) by mouth once daily   cabergoline (DOSTINEX) 0.5 MG tablet Take 0.5 tablets (0.25 mg total) by mouth daily.   lansoprazole (PREVACID) 30 MG capsule Take 1 capsule (30 mg total) by mouth daily at 12:00 noon.   levothyroxine (SYNTHROID) 100 MCG tablet Take 1 tablet (100 mcg total) by mouth daily before breakfast.   metoprolol tartrate (LOPRESSOR) 25 MG tablet Take 1 tablet (25 mg total) by mouth 2 (two) times daily   METRONIDAZOLE, TOPICAL, 0.75 % LOTN Apply 1 application topically at bedtime. qhs to face for Rosacea   Multiple Vitamin (MULTIVITAMIN) tablet Take 1 tablet by mouth daily.   [DISCONTINUED] allopurinol (ZYLOPRIM) 100 MG tablet TAKE 1 TABLET BY MOUTH DAILY.   [DISCONTINUED] mometasone (ELOCON) 0.1 % cream Apply to affected areas/rash on lower legs once to twice a day.  Avoid face, groin, and axilla.   allopurinol (ZYLOPRIM) 100 MG tablet Take 1 tablet (100 mg total) by mouth daily.   No facility-administered encounter medications on file as of 07/07/2022.     Lab Results  Component Value Date   WBC 8.1 07/07/2022   HGB 13.5 07/07/2022   HCT 40.3 07/07/2022   PLT 199.0 07/07/2022   GLUCOSE 97 07/07/2022    CHOL 149 07/07/2022   TRIG 284.0 (H) 07/07/2022   HDL 38.70 (L) 07/07/2022   LDLDIRECT 89.0 07/07/2022   LDLCALC 89 03/24/2022   ALT 43 (H) 07/07/2022   AST 31 07/07/2022   NA 143 07/07/2022   K 3.7 07/07/2022   CL 105 07/07/2022   CREATININE 1.13 07/07/2022   BUN 13 07/07/2022   CO2 29 07/07/2022   TSH 2.25 07/07/2022   HGBA1C 6.1 03/24/2022    DG Bone Density  Result Date: 08/12/2021 EXAM: DUAL X-RAY ABSORPTIOMETRY (DXA) FOR BONE MINERAL DENSITY IMPRESSION: Your patient Porsha Aragon completed a BMD test on 08/12/2021 using the [employer] DXA System (software version: 14.10) manufactured by [employer]. The following summarizes the results of our evaluation. Technologist: [facility] PATIENT BIOGRAPHICAL: Name: Nayanna, Karam Patient ID: [id number] Birth Date: April 15, 1955 Height: 63.0 in. Gender: Female Exam Date: 08/12/2021 Weight: 167.0 lbs. Indications: Caucasian, Hypothyroid, Hysterectomy, Oophorectomy Bilateral, Postmenopausal Fractures: Treatments: Levothyroxine DENSITOMETRY RESULTS: Site      Region        Measured Date Measured Age WHO Classification Young Adult T-score BMD         %Change vs. Previous Significant Change (*) AP Spine L1-L4 (L2,L3) 08/12/2021 66.1 Normal 0.0 1.183 g/cm2 DualFemur Neck Left 08/12/2021 66.1 Normal -0.7 0.947 g/cm2 ASSESSMENT: The BMD measured at Femur Neck Left is 0.947 g/cm2 with a T-score of -0.7. This patient is considered normal according to World Health Organization [facility]) criteria. The scan quality is good. L-2 & 3 was excluded due to degenerative changes. World Health Organization [facility]) criteria for post-menopausal, Caucasian  Women: Normal:                   T-score at or above -1 SD Osteopenia/low bone mass: T-score between -1 and -2.5 SD Osteoporosis:             T-score at or below -2.5 SD RECOMMENDATIONS: 1. All patients should optimize calcium and vitamin D intake. 2. Consider FDA-approved medical therapies in postmenopausal women and  men aged 38 years and older, based on the following: a. A hip or vertebral(clinical or morphometric) fracture b. T-score < -2.5 at the femoral neck or spine after appropriate evaluation to exclude secondary causes c. Low bone mass (T-score between -1.0 and -2.5 at the femoral neck or spine) and a 10-year probability of a hip fracture > 3% or a 10-year probability of a major osteoporosis-related fracture > 20% based on the [place]-adapted WHO algorithm 3. Clinician judgment and/or patient preferences may indicate treatment for people with 10-year fracture probabilities above or below these levels FOLLOW-UP: People with diagnosed cases of osteoporosis or at high risk for fracture should have regular bone mineral density tests. For patients eligible for Medicare, routine testing is allowed once every 2 years. The testing frequency can be increased to one year for patients who have rapidly progressing disease, those who are receiving or discontinuing medical therapy to restore bone mass, or have additional risk factors. I have reviewed this report, and agree with the above findings. [facility] Electronically Signed   By: Ernie Avena M.D.   On: 08/12/2021 15:02       Assessment & Plan:  Routine general medical examination at a health care facility  Health care maintenance Assessment & Plan: Physical today 07/07/22. PAP 06/18/21 - negative with negative HPV.   Colonoscopy 02/2017 - recommended f/u in 5 years.  (tubular adenoma).  Referral placed for GI. Diagnostic mammogram 07/07/21 - Birads I.  Due f/u mammogram.  With pain left breast as outlined, schedule diagnostic bilateral mammogram and possible ultrasound.    Visit for screening mammogram  Hypercholesteremia Assessment & Plan: On pravastatin.  Low cholesterol diet and exercise.  Follow lipid panel and liver function tests.   Orders: -     CBC with Differential/Platelet -     Lipid panel -     Hepatic function panel -     Basic  metabolic panel -     TSH  Breast pain, left Assessment & Plan: Pain 9:00 left breast.  Schedule diagnostic mammogram (bilateral) - given due screening.  Also possible ultrasound.   Orders: -     MM 3D DIAGNOSTIC MAMMOGRAM BILATERAL BREAST; Future -     [place] LIMITED ULTRASOUND INCLUDING AXILLA LEFT BREAST ; Future  Colon cancer screening -     Ambulatory referral to Gastroenterology  Acromegaly [facility]) Assessment & Plan: Continues on cabergoline.  Followed by endocrinology.     Family history of colonic polyps Assessment & Plan: Colonoscopy 08/2017 - one polyp ascending colon.  Refer to GI for colonoscopy.    Fatty liver Assessment & Plan: Diet and exercise.  Has seen GI. Follow liver function tests.     Hyperglycemia Assessment & Plan: Low carb diet and exercise.  Follow met b and a1c.    Primary hypertension Assessment & Plan: Continue metoprolol and triam/hctz.  Blood pressure as outlined. Follow metabolic panel.    Hypothyroidism, unspecified type Assessment & Plan: On thyroid replacement.  Follow tsh.    Pituitary macroadenoma (HCC) Assessment & Plan: Previous -  elevated GH - acromegaly.  Followed by Dr Tedd Sias.  On cabergoline.  Recommended f/u in year.    Other orders -     Allopurinol; Take 1 tablet (100 mg total) by mouth daily.  Dispense: 90 tablet; Refill: 3 -     LDL cholesterol, direct     Dale Mulford, MD

## 2022-07-08 LAB — HEPATIC FUNCTION PANEL
ALT: 43 U/L — ABNORMAL HIGH (ref 0–35)
AST: 31 U/L (ref 0–37)
Albumin: 4.2 g/dL (ref 3.5–5.2)
Alkaline Phosphatase: 88 U/L (ref 39–117)
Bilirubin, Direct: 0.1 mg/dL (ref 0.0–0.3)
Total Bilirubin: 0.5 mg/dL (ref 0.2–1.2)
Total Protein: 6.8 g/dL (ref 6.0–8.3)

## 2022-07-08 LAB — BASIC METABOLIC PANEL
BUN: 13 mg/dL (ref 6–23)
CO2: 29 mEq/L (ref 19–32)
Calcium: 9.1 mg/dL (ref 8.4–10.5)
Chloride: 105 mEq/L (ref 96–112)
Creatinine, Ser: 1.13 mg/dL (ref 0.40–1.20)
GFR: 50.51 mL/min — ABNORMAL LOW (ref >60.00)
Glucose, Bld: 97 mg/dL (ref 70–99)
Potassium: 3.7 mEq/L (ref 3.5–5.1)
Sodium: 143 mEq/L (ref 135–145)

## 2022-07-08 LAB — CBC WITH DIFFERENTIAL/PLATELET
Basophils Absolute: 0.1 10*3/uL (ref 0.0–0.1)
Basophils Relative: 0.6 % (ref 0.0–3.0)
Eosinophils Absolute: 0.1 10*3/uL (ref 0.0–0.7)
Eosinophils Relative: 1.7 % (ref 0.0–5.0)
HCT: 40.3 % (ref 36.0–46.0)
Hemoglobin: 13.5 g/dL (ref 12.0–15.0)
Lymphocytes Relative: 22.2 % (ref 12.0–46.0)
Lymphs Abs: 1.8 10*3/uL (ref 0.7–4.0)
MCHC: 33.5 g/dL (ref 30.0–36.0)
MCV: 90.3 fl (ref 78.0–100.0)
Monocytes Absolute: 0.7 10*3/uL (ref 0.1–1.0)
Monocytes Relative: 8.6 % (ref 3.0–12.0)
Neutro Abs: 5.4 10*3/uL (ref 1.4–7.7)
Neutrophils Relative %: 66.9 % (ref 43.0–77.0)
Platelets: 199 10*3/uL (ref 150.0–400.0)
RBC: 4.46 Mil/uL (ref 3.87–5.11)
RDW: 13.8 % (ref 11.5–15.5)
WBC: 8.1 10*3/uL (ref 4.0–10.5)

## 2022-07-08 LAB — LIPID PANEL
Cholesterol: 149 mg/dL (ref 0–200)
HDL: 38.7 mg/dL — ABNORMAL LOW (ref >39.00)
NonHDL: 110.26
Total CHOL/HDL Ratio: 4
Triglycerides: 284 mg/dL — ABNORMAL HIGH (ref 0.0–149.0)
VLDL: 56.8 mg/dL — ABNORMAL HIGH (ref 0.0–40.0)

## 2022-07-08 LAB — LDL CHOLESTEROL, DIRECT: Direct LDL: 89 mg/dL

## 2022-07-08 LAB — TSH: TSH: 2.25 u[IU]/mL (ref 0.35–5.50)

## 2022-07-08 NOTE — Telephone Encounter (Signed)
Order placed for gI referral.

## 2022-07-09 ENCOUNTER — Other Ambulatory Visit: Payer: Self-pay

## 2022-07-12 ENCOUNTER — Encounter: Payer: Self-pay | Admitting: Internal Medicine

## 2022-07-12 NOTE — Assessment & Plan Note (Signed)
Previous - elevated GH - acromegaly.  Followed by Dr Solum.  On cabergoline.  Recommended f/u in year.  

## 2022-07-12 NOTE — Assessment & Plan Note (Signed)
Colonoscopy 08/2017 - one polyp ascending colon.  Refer to GI for colonoscopy.

## 2022-07-12 NOTE — Assessment & Plan Note (Signed)
Diet and exercise.  Has seen GI.  Follow liver function tests.   

## 2022-07-12 NOTE — Assessment & Plan Note (Signed)
Low carb diet and exercise.  Follow met b and a1c.   

## 2022-07-12 NOTE — Assessment & Plan Note (Signed)
Continues on cabergoline.  Followed by endocrinology.   

## 2022-07-12 NOTE — Assessment & Plan Note (Signed)
Pain 9:00 left breast.  Schedule diagnostic mammogram (bilateral) - given due screening.  Also possible ultrasound.

## 2022-07-12 NOTE — Assessment & Plan Note (Signed)
Continue metoprolol and triam/hctz.  Blood pressure as outlined. Follow metabolic panel.

## 2022-07-12 NOTE — Assessment & Plan Note (Signed)
On thyroid replacement.  Follow tsh.  

## 2022-07-12 NOTE — Assessment & Plan Note (Signed)
On pravastatin.  Low cholesterol diet and exercise.  Follow lipid panel and liver function tests.   

## 2022-07-20 ENCOUNTER — Ambulatory Visit: Payer: Medicare HMO | Admitting: Dermatology

## 2022-07-20 VITALS — BP 123/77

## 2022-07-20 DIAGNOSIS — L578 Other skin changes due to chronic exposure to nonionizing radiation: Secondary | ICD-10-CM

## 2022-07-20 DIAGNOSIS — D492 Neoplasm of unspecified behavior of bone, soft tissue, and skin: Secondary | ICD-10-CM

## 2022-07-20 DIAGNOSIS — C4492 Squamous cell carcinoma of skin, unspecified: Secondary | ICD-10-CM

## 2022-07-20 DIAGNOSIS — C4442 Squamous cell carcinoma of skin of scalp and neck: Secondary | ICD-10-CM | POA: Diagnosis not present

## 2022-07-20 DIAGNOSIS — W908XXA Exposure to other nonionizing radiation, initial encounter: Secondary | ICD-10-CM

## 2022-07-20 HISTORY — DX: Squamous cell carcinoma of skin, unspecified: C44.92

## 2022-07-20 NOTE — Progress Notes (Signed)
   Follow-Up Visit   Subjective  Marie Colon is a 67 y.o. female who presents for the following: growth frontal scalp, txted with LN2 in past, got smaller but was unable to make f/u and it grew back   The following portions of the chart were reviewed this encounter and updated as appropriate: medications, allergies, medical history  Review of Systems:  No other skin or systemic complaints except as noted in HPI or Assessment and Plan.  Objective  Well appearing patient in no apparent distress; mood and affect are within normal limits.   A focused examination was performed of the following areas: scalp  Relevant exam findings are noted in the Assessment and Plan.  Mid frontal scalp 1.1cm firm keratotic nodule       Assessment & Plan     Neoplasm of skin Mid frontal scalp  Epidermal / dermal shaving  Lesion diameter (cm):  1.1 Informed consent: discussed and consent obtained   Patient was prepped and draped in usual sterile fashion: area prepped with alcohol. Anesthesia: the lesion was anesthetized in a standard fashion   Anesthetic:  1% lidocaine w/ epinephrine 1-100,000 buffered w/ 8.4% NaHCO3 Instrument used: flexible razor blade   Hemostasis achieved with: pressure, aluminum chloride and electrodesiccation   Outcome: patient tolerated procedure well   Post-procedure details: wound care instructions given   Post-procedure details comment:  Ointment and small bandage applied  Specimen 1 - Surgical pathology Differential Diagnosis: D48.5 ISK R/O Skin Cancer  Check Margins: No 1.1cm firm keratotic nodule  ISK r/o SCC  Discussed mohs if a skin cancer, pt would prefer [facility] DAMAGE - chronic, secondary to cumulative UV radiation exposure/sun exposure over time - diffuse scaly erythematous macules with underlying dyspigmentation - Recommend daily broad spectrum sunscreen SPF 30+ to sun-exposed areas, reapply every 2  hours as needed.  - Recommend staying in the shade or wearing long sleeves, sun glasses (UVA+UVB protection) and wide brim hats (4-inch brim around the entire circumference of the hat). - Call for new or changing lesions.   Return for as scheduled for TBSE.  I, Ardis Rowan, RMA, am acting as scribe for Willeen Niece, MD .   Documentation: I have reviewed the above documentation for accuracy and completeness, and I agree with the above.  Willeen Niece, MD

## 2022-07-20 NOTE — Patient Instructions (Addendum)
[facility] [place]     Wound Care Instructions  Cleanse wound gently with soap and water once a day then pat dry with clean gauze. Apply a thin coat of Petrolatum (petroleum jelly, "Vaseline") over the wound (unless you have an allergy to this). We recommend that you use a new, sterile tube of Vaseline. Do not pick or remove scabs. Do not remove the yellow or white "healing tissue" from the base of the wound.  Cover the wound with fresh, clean, nonstick gauze and secure with paper tape. You may use Band-Aids in place of gauze and tape if the wound is small enough, but would recommend trimming much of the tape off as there is often too much. Sometimes Band-Aids can irritate the skin.  You should call the office for your biopsy report after 1 week if you have not already been contacted.  If you experience any problems, such as abnormal amounts of bleeding, swelling, significant bruising, significant pain, or evidence of infection, please call the office immediately.  FOR ADULT SURGERY PATIENTS: If you need something for pain relief you may take 1 extra strength Tylenol (acetaminophen) AND 2 Ibuprofen (200mg  each) together every 4 hours as needed for pain. (do not take these if you are allergic to them or if you have a reason you should not take them.) Typically, you may only need pain medication for 1 to 3 days.      Due to recent changes in healthcare laws, you may see results of your pathology and/or laboratory studies on MyChart before the doctors have had a chance to review them. We understand that in some cases there may be results that are confusing or concerning to you. Please understand that not all results are received at the same time and often the doctors may need to interpret multiple results in order to provide you with the best plan of care or course of treatment. Therefore, we ask that you please give [place] 2 business days to thoroughly review all your results before  contacting the office for clarification. Should we see a critical lab result, you will be contacted sooner.   If You Need Anything After Your Visit  If you have any questions or concerns for your doctor, please call our main line at [phone] and press option 4 to reach your doctor's medical assistant. If no one answers, please leave a voicemail as directed and we will return your call as soon as possible. Messages left after 4 pm will be answered the following business day.   You may also send [place] a message via MyChart. We typically respond to MyChart messages within 1-2 business days.  For prescription refills, please ask your pharmacy to contact our office. Our fax number is [phone].  If you have an urgent issue when the clinic is closed that cannot wait until the next business day, you can page your doctor at the number below.    Please note that while we do our best to be available for urgent issues outside of office hours, we are not available 24/7.   If you have an urgent issue and are unable to reach [place], you may choose to seek medical care at your doctor's office, retail clinic, urgent care center, or emergency room.  If you have a medical emergency, please immediately call 911 or go to the emergency department.  Pager Numbers  - Dr. Gwen Pounds: [phone]  - Dr. Neale Burly: [phone]  - Dr. Roseanne Reno: [phone]  In the  event of inclement weather, please call our main line at [phone] for an update on the status of any delays or closures.  Dermatology Medication Tips: Please keep the boxes that topical medications come in in order to help keep track of the instructions about where and how to use these. Pharmacies typically print the medication instructions only on the boxes and not directly on the medication tubes.   If your medication is too expensive, please contact our office at [phone] option 4 or send [place] a message through MyChart.   We are unable to tell  what your co-pay for medications will be in advance as this is different depending on your insurance coverage. However, we may be able to find a substitute medication at lower cost or fill out paperwork to get insurance to cover a needed medication.   If a prior authorization is required to get your medication covered by your insurance company, please allow [place] 1-2 business days to complete this process.  Drug prices often vary depending on where the prescription is filled and some pharmacies may offer cheaper prices.  The website www.goodrx.com contains coupons for medications through different pharmacies. The prices here do not account for what the cost may be with help from insurance (it may be cheaper with your insurance), but the website can give you the price if you did not use any insurance.  - You can print the associated coupon and take it with your prescription to the pharmacy.  - You may also stop by our office during regular business hours and pick up a GoodRx coupon card.  - If you need your prescription sent electronically to a different pharmacy, notify our office through [facility] or by phone at [phone] option 4.     Si Usted Necesita Algo Despus de Su Visita  Tambin puede enviarnos un mensaje a travs de Clinical cytogeneticist. Por lo general respondemos a los mensajes de MyChart en el transcurso de 1 a 2 das hbiles.  Para renovar recetas, por favor pida a su farmacia que se ponga en contacto con nuestra oficina. Annie Sable de fax es [place] [phone].  Si tiene un asunto urgente cuando la clnica est cerrada y que no puede esperar hasta el siguiente da hbil, puede llamar/localizar a su doctor(a) al nmero que aparece a continuacin.   Por favor, tenga en cuenta que aunque hacemos todo lo posible para estar disponibles para asuntos urgentes fuera del horario de [place], no estamos disponibles las 24 horas del da, los 7 [address]  [address] de la [place].   Si tiene un problema  urgente y no puede comunicarse con nosotros, puede optar por buscar atencin mdica  en el consultorio de su doctor(a), en una clnica privada, en un centro de atencin urgente o en una sala de emergencias.  Si tiene Engineer, drilling, por favor llame inmediatamente al 911 o vaya a la sala de emergencias.  Nmeros de bper  - Dr. Gwen Pounds: [phone]  - Dra. Moye: [phone]  - Dra. Roseanne Reno: [phone]  En caso de inclemencias del [place], por favor llame a Lacy Duverney principal al [phone] para una actualizacin sobre el [place] de cualquier retraso o cierre.  Consejos para la medicacin en dermatologa: Por favor, guarde las cajas en las que vienen los medicamentos de uso tpico para ayudarle a seguir las instrucciones sobre dnde y cmo usarlos. Las farmacias generalmente imprimen las instrucciones del medicamento slo en las cajas y no directamente en los tubos del [place].   Si su  medicamento es [place] caro, por favor, pngase en contacto con Rolm Gala llamando al [phone] y presione la opcin 4 o envenos un mensaje a travs de Clinical cytogeneticist.   No podemos decirle cul ser su copago por los medicamentos por adelantado ya que esto es diferente dependiendo de la cobertura de su seguro. Sin embargo, es posible que podamos encontrar un medicamento sustituto a Audiological scientist un formulario para que el seguro cubra el medicamento que se considera necesario.   Si se requiere una autorizacin previa para que su compaa de seguros [place] su medicamento, por favor permtanos de 1 a 2 das hbiles para completar [address]  Los precios de los medicamentos varan con frecuencia dependiendo del Environmental consultant de dnde se surte la receta y alguna farmacias pueden ofrecer precios ms baratos.  El sitio web www.goodrx.com tiene cupones para medicamentos de Health and safety inspector. Los precios aqu no tienen en cuenta lo que podra costar con la ayuda del seguro (puede ser ms barato con  su seguro), pero el sitio web puede darle el precio si no utiliz Tourist information centre manager.  - Puede imprimir el cupn correspondiente y llevarlo con su receta a la farmacia.  - Tambin puede pasar por nuestra oficina durante el horario de atencin regular y Education officer, museum una tarjeta de cupones de GoodRx.  - Si necesita que su receta se enve electrnicamente a una farmacia diferente, informe a nuestra oficina a travs de MyChart de [place] o por telfono llamando al [phone] y presione la opcin 4.

## 2022-07-24 ENCOUNTER — Ambulatory Visit
Admission: RE | Admit: 2022-07-24 | Discharge: 2022-07-24 | Disposition: A | Discharge status: Home / Self Care | Payer: Medicare HMO | Source: Ambulatory Visit | Attending: Internal Medicine | Admitting: Internal Medicine

## 2022-07-24 DIAGNOSIS — R92313 Mammographic fatty tissue density, bilateral breasts: Secondary | ICD-10-CM | POA: Diagnosis not present

## 2022-07-24 DIAGNOSIS — N644 Mastodynia: Secondary | ICD-10-CM | POA: Insufficient documentation

## 2022-07-27 ENCOUNTER — Other Ambulatory Visit: Payer: Self-pay

## 2022-07-27 ENCOUNTER — Telehealth: Payer: Self-pay

## 2022-07-27 DIAGNOSIS — C4492 Squamous cell carcinoma of skin, unspecified: Secondary | ICD-10-CM

## 2022-07-27 NOTE — Progress Notes (Signed)
Referral sent to skin surgery center in [place]  

## 2022-07-27 NOTE — Telephone Encounter (Signed)
-----   Message from Willeen Niece, MD sent at 07/27/2022  2:59 PM EDT ----- Skin , mid frontal scalp WELL DIFFERENTIATED SQUAMOUS CELL CARCINOMA  SCC skin cancer, recommend Mohs surgery, pt prefers [facility]   - please call patient

## 2022-07-27 NOTE — Telephone Encounter (Signed)
Discussed biopsy results with patient, we will send referral to skin surgery center in [place].

## 2022-07-27 NOTE — Progress Notes (Signed)
Error

## 2022-08-11 ENCOUNTER — Telehealth: Payer: Self-pay | Admitting: Internal Medicine

## 2022-08-11 NOTE — Telephone Encounter (Signed)
Pt called stating she is having sinus issues and have a sore throat snce Saturday. Pt is taking OTC medications and want to know if she need to come in or not

## 2022-08-11 NOTE — Telephone Encounter (Signed)
Patient having sinus symptoms and sore throat since Saturday. Covid negative at home over the weekend. Scheduled to see [facility] tomorrow. Will be seen sooner if develops any worsening acute symptoms.

## 2022-08-12 ENCOUNTER — Other Ambulatory Visit: Payer: Self-pay

## 2022-08-12 ENCOUNTER — Ambulatory Visit (INDEPENDENT_AMBULATORY_CARE_PROVIDER_SITE_OTHER): Payer: Medicare HMO | Admitting: Family

## 2022-08-12 VITALS — BP 130/78 | HR 77 | Temp 99.6°F | Ht 61.0 in | Wt 167.4 lb

## 2022-08-12 DIAGNOSIS — J40 Bronchitis, not specified as acute or chronic: Secondary | ICD-10-CM | POA: Insufficient documentation

## 2022-08-12 MED ORDER — AZITHROMYCIN 250 MG PO TABS
ORAL_TABLET | ORAL | 0 refills | Status: AC
Start: 2022-08-12 — End: 2022-08-17
  Filled 2022-08-12: qty 6, 5d supply, fill #0

## 2022-08-12 MED ORDER — GUAIFENESIN-CODEINE 100-10 MG/5ML PO SYRP
5.0000 mL | ORAL_SOLUTION | Freq: Every evening | ORAL | 0 refills | Status: DC | PRN
Start: 2022-08-12 — End: 2022-11-06
  Filled 2022-08-12: qty 50, 10d supply, fill #0

## 2022-08-12 NOTE — Patient Instructions (Addendum)
Start zpak; as discussed, you may certainly wait a couple of days and continue Mucinex to see if you start feeling better.  If symptoms persist or worsen, please start Z-Pak.  Ensure to take probiotics while on antibiotics and also for 2 weeks after completion. This can either be by eating yogurt daily or taking a probiotic supplement over the counter such as Culturelle.It is important to re-colonize the gut with good bacteria and also to prevent any diarrheal infections associated with antibiotic use.    Codeine based cough syrup  Please take cough medication at night only as needed. As we discussed, I do not recommend dosing throughout the day as coughing is a protective mechanism . It also helps to break up thick mucous.  Do not take cough suppressants with alcohol as can lead to trouble breathing. Advise caution if taking cough suppressant and operating machinery ( i.e driving a car) as you may feel very tired.

## 2022-08-12 NOTE — Assessment & Plan Note (Signed)
Duration 6 days.  No acute respiratory distress.  Discussed whether viral versus beginning of bacterial URI.  Advise she may continue Mucinex for couple days and if symptoms persist or worsen then to start azithromycin.  Provided guaifenesin codeine cough syrup for bedtime.  She will let me know how she is doing

## 2022-08-12 NOTE — Progress Notes (Signed)
Assessment & Plan:  Bronchitis Assessment & Plan: Duration 6 days.  No acute respiratory distress.  Discussed whether viral versus beginning of bacterial URI.  Advise she may continue Mucinex for couple days and if symptoms persist or worsen then to start azithromycin.  Provided guaifenesin codeine cough syrup for bedtime.  She will let me know how she is doing  Orders: -     guaiFENesin-Codeine; Take 5 mLs by mouth at bedtime as needed for cough.  Dispense: 50 mL; Refill: 0 -     Azithromycin; Take 2 tablets on day 1, then 1 tablet daily on days 2 through 5  Dispense: 6 tablet; Refill: 0     Return precautions given.   Risks, benefits, and alternatives of the medications and treatment plan prescribed today were discussed, and patient expressed understanding.   Education regarding symptom management and diagnosis given to patient on AVS either electronically or printed.  No follow-ups on file.  Rennie Plowman, FNP  Subjective:    Patient ID: Daneil Dolin, female    DOB: 22-Oct-1955, 67 y.o.   MRN: [id number]  CC: SHELBE HAGLUND is a 67 y.o. female who presents today for an acute visit.    HPI: Complains of sinus pressure, cough x 6 days ago Cough is worse at night.   Endorses bilateral sore throat and ear pain.  Covid negative 3 days ago  No fever, chills, sob, wheezing  Using tessalon perles, mucinex and delsym with some relief.    Husband has been sick  Previously been on codeine-based cough syrup    No history of lung disease Never smoker   Allergies: Protonix [pantoprazole] and Penicillins Current Outpatient Medications on File Prior to Visit  Medication Sig Dispense Refill   allopurinol (ZYLOPRIM) 100 MG tablet Take 1 tablet (100 mg total) by mouth daily. 90 tablet 3   aspirin EC 81 MG tablet Take 81 mg by mouth daily.     atorvastatin (LIPITOR) 40 MG tablet Take 1 tablet (40 mg total) by mouth once daily 90 tablet 4   cabergoline (DOSTINEX) 0.5 MG  tablet Take 0.5 tablets (0.25 mg total) by mouth daily. 45 tablet 3   lansoprazole (PREVACID) 30 MG capsule Take 1 capsule (30 mg total) by mouth daily at 12:00 noon. 90 capsule 3   levothyroxine (SYNTHROID) 100 MCG tablet Take 1 tablet (100 mcg total) by mouth daily before breakfast. 90 tablet 2   metoprolol tartrate (LOPRESSOR) 25 MG tablet Take 1 tablet (25 mg total) by mouth 2 (two) times daily 180 tablet 4   Multiple Vitamin (MULTIVITAMIN) tablet Take 1 tablet by mouth daily.     No current facility-administered medications on file prior to visit.    Review of Systems  Constitutional:  Negative for chills and fever.  HENT:  Positive for congestion and sore throat.   Respiratory:  Positive for cough.   Cardiovascular:  Negative for chest pain and palpitations.  Gastrointestinal:  Negative for nausea and vomiting.      Objective:    BP 130/78   Pulse 77   Temp 99.6 F (37.6 C) (Oral)   Ht 5\' 1"  (1.549 m)   Wt 167 lb 6.4 oz (75.9 kg)   SpO2 97%   BMI 31.63 kg/m   BP Readings from Last 3 Encounters:  08/12/22 130/78  07/20/22 123/77  07/07/22 122/74   Wt Readings from Last 3 Encounters:  08/12/22 167 lb 6.4 oz (75.9 kg)  07/07/22 171 lb 3.2 oz (  77.7 kg)  03/23/22 168 lb (76.2 kg)    Physical Exam Vitals reviewed.  Constitutional:      Appearance: She is well-developed.  HENT:     Head: Normocephalic and atraumatic.     Right Ear: Hearing, tympanic membrane, ear canal and external ear normal. No decreased hearing noted. No drainage, swelling or tenderness. No middle ear effusion. No foreign body. Tympanic membrane is not erythematous or bulging.     Left Ear: Hearing, tympanic membrane, ear canal and external ear normal. No decreased hearing noted. No drainage, swelling or tenderness.  No middle ear effusion. No foreign body. Tympanic membrane is not erythematous or bulging.     Nose: Nose normal. No rhinorrhea.     Right Sinus: No maxillary sinus tenderness or  frontal sinus tenderness.     Left Sinus: No maxillary sinus tenderness or frontal sinus tenderness.     Mouth/Throat:     Pharynx: Uvula midline. No oropharyngeal exudate or posterior oropharyngeal erythema.     Tonsils: No tonsillar abscesses.  Eyes:     Conjunctiva/sclera: Conjunctivae normal.  Cardiovascular:     Rate and Rhythm: Regular rhythm.     Pulses: Normal pulses.     Heart sounds: Normal heart sounds.  Pulmonary:     Effort: Pulmonary effort is normal.     Breath sounds: Normal breath sounds. No wheezing, rhonchi or rales.  Lymphadenopathy:     Head:     Right side of head: No submental, submandibular, tonsillar, preauricular, posterior auricular or occipital adenopathy.     Left side of head: No submental, submandibular, tonsillar, preauricular, posterior auricular or occipital adenopathy.     Cervical: No cervical adenopathy.  Skin:    General: Skin is warm and dry.  Neurological:     Mental Status: She is alert.  Psychiatric:        Speech: Speech normal.        Behavior: Behavior normal.        Thought Content: Thought content normal.

## 2022-08-13 ENCOUNTER — Other Ambulatory Visit: Payer: Self-pay

## 2022-08-13 ENCOUNTER — Encounter: Payer: Self-pay | Admitting: Internal Medicine

## 2022-08-13 NOTE — Telephone Encounter (Signed)
She can use delsym or tessalon perles prn

## 2022-08-13 NOTE — Telephone Encounter (Signed)
Pt aware. Confirmed doing ok. Only complaint is the cough. She said she did sleep better last night.

## 2022-08-14 ENCOUNTER — Encounter: Payer: Self-pay | Admitting: Internal Medicine

## 2022-08-14 ENCOUNTER — Telehealth (INDEPENDENT_AMBULATORY_CARE_PROVIDER_SITE_OTHER): Payer: Medicare HMO | Admitting: Internal Medicine

## 2022-08-14 DIAGNOSIS — I10 Essential (primary) hypertension: Secondary | ICD-10-CM

## 2022-08-14 DIAGNOSIS — R051 Acute cough: Secondary | ICD-10-CM

## 2022-08-14 MED ORDER — TRIAMCINOLONE ACETONIDE 55 MCG/ACT NA AERO: 2.0000 | INHALATION_SPRAY | Freq: Every day | NASAL | 0 refills | Status: DC

## 2022-08-14 MED ORDER — PREDNISONE 10 MG PO TABS: ORAL_TABLET | ORAL | 0 refills | Status: DC

## 2022-08-14 NOTE — Telephone Encounter (Signed)
Pt scheduled for virtual with Dr Lorin Picket.

## 2022-08-14 NOTE — Progress Notes (Unsigned)
Patient ID: Marie Colon, female   DOB: January 24, 1956, 67 y.o.   MRN: [id number]   Virtual Visit via video Note  I connected with Marie Colon today by a video enabled telemedicine application and verified that I am speaking with the correct person using two identifiers. Location patient: home Location provider: work Persons participating in the virtual visit: patient, provider  The limitations, risks, security and privacy concerns of performing an evaluation and management service by vidoe and the availability of in person appointments have been discussed.  It has also been discussed with the patient that there may be a patient responsible charge related to this service. The patient expressed understanding and agreed to proceed.   Reason for visit: work in appt  HPI: Work in appt with concerns regarding persistent cough. Was evaluated 08/12/22 - cough and congestion.  Was placed on zpak.  Reports symptoms started over this past week.  Symptoms progressively worsened and was evaluated 08/12/22 as outlined.  Reports increased sinus pressure and nasal congestion.  Previous sore throat.  Better now, but still sore.  Some pain - ears.  Increased drainage.  No sob.  No chest pain.  She is eating - decreased appetite.  Some diarrhea.  Previous low grade fever.  Has taken mucinex and delsym.  Increased cough - coughing fits.     ROS: See pertinent positives and negatives per HPI.  Past Medical History:  Diagnosis Date   Acromegaly (HCC)    Actinic keratosis    Angiomyolipoma of kidney    evaluated by Dr Lonna Cobb   Fatty liver    GERD (gastroesophageal reflux disease)    Heart murmur    per Dr. Dale [place]   History of pyelonephritis    History of ulcerative colitis    Hx of dysplastic nevus 04/21/2016   L upper back, moderate to severe atypia   Hypercholesterolemia    Hypertension    Hypothyroidism    Pituitary macroadenoma (HCC)    s/p removal (elevated growth hormone)   squamous  cell carcinoma of skin 07/20/2022   mid frontal scalp, schedule Mohs   Tubal pregnancy    s/p rupture   Wears glasses     Past Surgical History:  Procedure Laterality Date   ABDOMINAL HYSTERECTOMY  2003   abnormal uterine bleeding   APPENDECTOMY     CHOLECYSTECTOMY  2005   COLONOSCOPY WITH PROPOFOL N/A 03/18/2017   Procedure: COLONOSCOPY WITH PROPOFOL;  Surgeon: Christena Deem, MD;  Location[facility] ENDOSCOPY;  Service: Endoscopy;  Laterality: N/A;   DILATION AND CURETTAGE OF UTERUS     MASS EXCISION Right 08/29/2015   Procedure: EXCISION OF RIGHT THIGH MASS SUBFASICAL 15 CM;  Surgeon: Almond Lint, MD;  [facility];  Service: General;  Laterality: Right;   pituitary tumor removal  2006    Family History  Problem Relation Age of Onset   Brain cancer Father        died - 3   Diabetes Mother    Hypothyroidism Sister    Breast cancer Sister 26   Sjogren's syndrome Sister    Breast cancer Sister 53   Diabetes Other        grandmother    SOCIAL HX: reviewed.    Current Outpatient Medications:    predniSONE (DELTASONE) 10 MG tablet, Take 4 tablets x 1 day and then decrease by 1/2 tablet per day until down to zero mg., Disp: 18 tablet, Rfl: 0   triamcinolone (NASACORT) 55 MCG/ACT AERO  nasal inhaler, Place 2 sprays into the nose daily., Disp: 1 each, Rfl: 0   allopurinol (ZYLOPRIM) 100 MG tablet, Take 1 tablet (100 mg total) by mouth daily., Disp: 90 tablet, Rfl: 3   aspirin EC 81 MG tablet, Take 81 mg by mouth daily., Disp: , Rfl:    atorvastatin (LIPITOR) 40 MG tablet, Take 1 tablet (40 mg total) by mouth once daily, Disp: 90 tablet, Rfl: 4   azithromycin (ZITHROMAX) 250 MG tablet, Take 2 tablets (500 mg total) by mouth daily for 1 day, THEN 1 tablet (250 mg total) daily for 4 days., Disp: 6 tablet, Rfl: 0   cabergoline (DOSTINEX) 0.5 MG tablet, Take 0.5 tablets (0.25 mg total) by mouth daily., Disp: 45 tablet, Rfl: 3   guaiFENesin-codeine (ROBITUSSIN AC) 100-10 MG/5ML  syrup, Take 5 mLs by mouth at bedtime as needed for cough., Disp: 50 mL, Rfl: 0   lansoprazole (PREVACID) 30 MG capsule, Take 1 capsule (30 mg total) by mouth daily at 12:00 noon., Disp: 90 capsule, Rfl: 3   levothyroxine (SYNTHROID) 100 MCG tablet, Take 1 tablet (100 mcg total) by mouth daily before breakfast., Disp: 90 tablet, Rfl: 2   metoprolol tartrate (LOPRESSOR) 25 MG tablet, Take 1 tablet (25 mg total) by mouth 2 (two) times daily, Disp: 180 tablet, Rfl: 4   Multiple Vitamin (MULTIVITAMIN) tablet, Take 1 tablet by mouth daily., Disp: , Rfl:   EXAM:  GENERAL: alert, oriented, appears well and in no acute distress  HEENT: atraumatic, conjunttiva clear, no obvious abnormalities on inspection of external nose and ears  NECK: normal movements of the head and neck  LUNGS: on inspection no signs of respiratory distress, breathing rate appears normal, no obvious gross SOB, gasping or wheezing. Increased cough with forced expiration.   CV: no obvious cyanosis  PSYCH/NEURO: pleasant and cooperative, no obvious depression or anxiety, speech and thought processing grossly intact  ASSESSMENT AND PLAN:  Discussed the following assessment and plan:  Problem List Items Addressed This Visit     Hypertension    Continue metoprolol and triam/hctz.  Blood pressure as outlined. Follow metabolic panel.       Cough - Primary    Increased cough and congestion as outlined.  Currently taking zpak.  Will complete.  Saline nasal spray and nasacort nasal spray as directed.  Given increased cough and coughing fits, treat with prednisone taper as directed.  Delsym/tessalon perles for cough.  Follow.  Rest.  Stay hydrated.  Call with update.        Return if symptoms worsen or fail to improve.   I discussed the assessment and treatment plan with the patient. The patient was provided an opportunity to ask questions and all were answered. The patient agreed with the plan and demonstrated an understanding  of the instructions.   The patient was advised to call back or seek an in-person evaluation if the symptoms worsen or if the condition fails to improve as anticipated.    Dale [place], MD

## 2022-08-15 ENCOUNTER — Encounter: Payer: Self-pay | Admitting: Internal Medicine

## 2022-08-15 DIAGNOSIS — R059 Cough, unspecified: Secondary | ICD-10-CM | POA: Insufficient documentation

## 2022-08-15 NOTE — Assessment & Plan Note (Signed)
Continue metoprolol and triam/hctz.  Blood pressure as outlined. Follow metabolic panel.

## 2022-08-15 NOTE — Assessment & Plan Note (Signed)
Increased cough and congestion as outlined.  Currently taking zpak.  Will complete.  Saline nasal spray and nasacort nasal spray as directed.  Given increased cough and coughing fits, treat with prednisone taper as directed.  Delsym/tessalon perles for cough.  Follow.  Rest.  Stay hydrated.  Call with update.

## 2022-08-17 ENCOUNTER — Other Ambulatory Visit: Payer: Self-pay

## 2022-08-17 ENCOUNTER — Encounter: Payer: Self-pay | Admitting: Internal Medicine

## 2022-08-17 MED ORDER — BENZONATATE 100 MG PO CAPS
100.0000 mg | ORAL_CAPSULE | Freq: Three times a day (TID) | ORAL | 0 refills | Status: DC | PRN
Start: 2022-08-17 — End: 2022-11-06
  Filled 2022-08-17: qty 21, 7d supply, fill #0

## 2022-08-17 NOTE — Telephone Encounter (Signed)
Patient aware of below and tessalon perles sent to [facility] per patient request.

## 2022-08-17 NOTE — Telephone Encounter (Signed)
Please call and thank her for the update.  I am glad she is starting to feel better.  Continue the prednisone.  Keep [place] posted.  Can send in more tessalon perles 100mg  tid prn cough #21 if needed.

## 2022-08-26 DIAGNOSIS — C44329 Squamous cell carcinoma of skin of other parts of face: Secondary | ICD-10-CM | POA: Diagnosis not present

## 2022-09-03 ENCOUNTER — Telehealth: Payer: Self-pay

## 2022-09-03 NOTE — Telephone Encounter (Signed)
Updated specimen tracking and history from MOHs progress notes and photos. aw ?

## 2022-10-01 ENCOUNTER — Encounter: Payer: Self-pay | Admitting: Dermatology

## 2022-10-01 ENCOUNTER — Ambulatory Visit: Payer: Medicare HMO | Admitting: Dermatology

## 2022-10-01 VITALS — BP 139/80 | HR 67

## 2022-10-01 DIAGNOSIS — L821 Other seborrheic keratosis: Secondary | ICD-10-CM

## 2022-10-01 DIAGNOSIS — D485 Neoplasm of uncertain behavior of skin: Secondary | ICD-10-CM

## 2022-10-01 DIAGNOSIS — Z85828 Personal history of other malignant neoplasm of skin: Secondary | ICD-10-CM

## 2022-10-01 DIAGNOSIS — L820 Inflamed seborrheic keratosis: Secondary | ICD-10-CM

## 2022-10-01 DIAGNOSIS — W908XXA Exposure to other nonionizing radiation, initial encounter: Secondary | ICD-10-CM | POA: Diagnosis not present

## 2022-10-01 DIAGNOSIS — L814 Other melanin hyperpigmentation: Secondary | ICD-10-CM | POA: Diagnosis not present

## 2022-10-01 DIAGNOSIS — D1801 Hemangioma of skin and subcutaneous tissue: Secondary | ICD-10-CM | POA: Diagnosis not present

## 2022-10-01 DIAGNOSIS — Z1283 Encounter for screening for malignant neoplasm of skin: Secondary | ICD-10-CM

## 2022-10-01 DIAGNOSIS — L578 Other skin changes due to chronic exposure to nonionizing radiation: Secondary | ICD-10-CM | POA: Diagnosis not present

## 2022-10-01 DIAGNOSIS — C4491 Basal cell carcinoma of skin, unspecified: Secondary | ICD-10-CM

## 2022-10-01 DIAGNOSIS — C44319 Basal cell carcinoma of skin of other parts of face: Secondary | ICD-10-CM

## 2022-10-01 DIAGNOSIS — Z86018 Personal history of other benign neoplasm: Secondary | ICD-10-CM | POA: Diagnosis not present

## 2022-10-01 DIAGNOSIS — Z8589 Personal history of malignant neoplasm of other organs and systems: Secondary | ICD-10-CM

## 2022-10-01 DIAGNOSIS — L918 Other hypertrophic disorders of the skin: Secondary | ICD-10-CM

## 2022-10-01 HISTORY — DX: Basal cell carcinoma of skin, unspecified: C44.91

## 2022-10-01 NOTE — Patient Instructions (Addendum)

## 2022-10-01 NOTE — Progress Notes (Signed)
Follow-Up Visit   Subjective  Marie Colon is a 67 y.o. female who presents for the following: Skin Cancer Screening and Full Body Skin Exam  The patient presents for Total-Body Skin Exam (TBSE) for skin cancer screening and mole check. The patient has spots, moles and lesions to be evaluated, some may be new or changing and the patient may have concern these could be cancer.    The following portions of the chart were reviewed this encounter and updated as appropriate: medications, allergies, medical history  Review of Systems:  No other skin or systemic complaints except as noted in HPI or Assessment and Plan.  Objective  Well appearing patient in no apparent distress; mood and affect are within normal limits.  A full examination was performed including scalp, head, eyes, ears, nose, lips, neck, chest, axillae, abdomen, back, buttocks, bilateral upper extremities, bilateral lower extremities, hands, feet, fingers, toes, fingernails, and toenails. All findings within normal limits unless otherwise noted below.   Relevant physical exam findings are noted in the Assessment and Plan.  L forehead Pink tan patch 1.1 cm     R pretibial x 1, R side x 1, L chest x 1, middle vertex scalp x 2 (5) Erythematous stuck-on, waxy papule or plaque    Assessment & Plan   SKIN CANCER SCREENING PERFORMED TODAY.  ACTINIC DAMAGE - Chronic condition, secondary to cumulative UV/sun exposure - diffuse scaly erythematous macules with underlying dyspigmentation - Recommend daily broad spectrum sunscreen SPF 30+ to sun-exposed areas, reapply every 2 hours as needed.  - Staying in the shade or wearing long sleeves, sun glasses (UVA+UVB protection) and wide brim hats (4-inch brim around the entire circumference of the hat) are also recommended for sun protection.  - Call for new or changing lesions.  LENTIGINES, SEBORRHEIC KERATOSES, HEMANGIOMAS - Benign normal skin lesions -  Benign-appearing - Call for any changes  MELANOCYTIC NEVI - Tan-brown and/or pink-flesh-colored symmetric macules and papules - Benign appearing on exam today - Observation - Call clinic for new or changing moles - Recommend daily use of broad spectrum spf 30+ sunscreen to sun-exposed areas.   HISTORY OF SQUAMOUS CELL CARCINOMA OF THE SKIN - No evidence of recurrence today - No lymphadenopathy - Recommend regular full body skin exams - Recommend daily broad spectrum sunscreen SPF 30+ to sun-exposed areas, reapply every 2 hours as needed.  - Call if any new or changing lesions are noted between office visits  HISTORY OF DYSPLASTIC NEVUS No evidence of recurrence today Recommend regular full body skin exams Recommend daily broad spectrum sunscreen SPF 30+ to sun-exposed areas, reapply every 2 hours as needed.  Call if any new or changing lesions are noted between office visits  Acrochordons (Skin Tags) - Fleshy, skin-colored pedunculated papules - Benign appearing.  - Observe. - If desired, they can be removed with an in office procedure that is not covered by insurance. - Please call the clinic if you notice any new or changing lesions.  Neoplasm of uncertain behavior of skin L forehead  Skin / nail biopsy Type of biopsy: tangential   Informed consent: discussed and consent obtained   Timeout: patient name, date of birth, surgical site, and procedure verified   Procedure prep:  Patient was prepped and draped in usual sterile fashion Prep type:  Isopropyl alcohol Anesthesia: the lesion was anesthetized in a standard fashion   Anesthetic:  1% lidocaine w/ epinephrine 1-100,000 buffered w/ 8.4% NaHCO3 Instrument used: flexible razor blade  Hemostasis achieved with: pressure, aluminum chloride and electrodesiccation   Outcome: patient tolerated procedure well   Post-procedure details: sterile dressing applied and wound care instructions given   Dressing type: bandage and  petrolatum    Specimen 1 - Surgical pathology Differential Diagnosis: D45.8 r/o BCC v other Check Margins: No  Inflamed seborrheic keratosis (5) R pretibial x 1, R side x 1, L chest x 1, middle vertex scalp x 2  Symptomatic, irritating, patient would like treated.   Destruction of lesion - R pretibial x 1, R side x 1, L chest x 1, middle vertex scalp x 2 (5) Complexity: simple   Destruction method: cryotherapy   Informed consent: discussed and consent obtained   Timeout:  patient name, date of birth, surgical site, and procedure verified Lesion destroyed using liquid nitrogen: Yes   Region frozen until ice ball extended beyond lesion: Yes   Outcome: patient tolerated procedure well with no complications   Post-procedure details: wound care instructions given     Return in about 6 months (around 03/31/2023) for TBSE.  Maylene Roes, CMA, am acting as scribe for Armida Sans, MD .  Documentation: I have reviewed the above documentation for accuracy and completeness, and I agree with the above.  Armida Sans, MD

## 2022-10-06 ENCOUNTER — Telehealth: Payer: Self-pay

## 2022-10-06 NOTE — Telephone Encounter (Signed)
Patient called back and has scheduled EDC with Dr. Gwen Pounds. aw

## 2022-10-06 NOTE — Telephone Encounter (Signed)
Advised pt of bx results and discussed treatment options EDC vs mohs.  Patient will discuss with husband and call us back and let us know which way she prefers to treat BCC./sh

## 2022-10-06 NOTE — Telephone Encounter (Signed)
-----   Message from Marie Colon sent at 10/06/2022  9:01 AM EDT ----- Diagnosis Skin , L forehead BASAL CELL CARCINOMA, SUPERFICIAL AND NODULAR PATTERNS  Cancer = BCC Schedule for treatment [facility])  If pt prefers MOHS, may schedule for MOHS

## 2022-10-15 ENCOUNTER — Other Ambulatory Visit: Payer: Self-pay

## 2022-10-16 ENCOUNTER — Other Ambulatory Visit: Payer: Self-pay

## 2022-10-20 ENCOUNTER — Encounter: Payer: Self-pay | Admitting: Dermatology

## 2022-10-20 ENCOUNTER — Ambulatory Visit: Payer: Medicare HMO | Admitting: Dermatology

## 2022-10-20 VITALS — BP 142/79

## 2022-10-20 DIAGNOSIS — C44319 Basal cell carcinoma of skin of other parts of face: Secondary | ICD-10-CM

## 2022-10-20 DIAGNOSIS — C44310 Basal cell carcinoma of skin of unspecified parts of face: Secondary | ICD-10-CM

## 2022-10-20 NOTE — Progress Notes (Unsigned)
   Follow-Up Visit   Subjective  Marie Colon is a 67 y.o. female who presents for the following: BCC bx proven L forehead, pt presents for [facility] The patient has spots, moles and lesions to be evaluated, some may be new or changing and the patient may have concern these could be cancer.   The following portions of the chart were reviewed this encounter and updated as appropriate: medications, allergies, medical history  Review of Systems:  No other skin or systemic complaints except as noted in HPI or Assessment and Plan.  Objective  Well appearing patient in no apparent distress; mood and affect are within normal limits.   A focused examination was performed of the following areas: face  Relevant exam findings are noted in the Assessment and Plan.  L forehead Pink bx site 1.1cm    Assessment & Plan     Basal cell carcinoma (BCC) of skin of face, unspecified part of face L forehead  Destruction of lesion Complexity: extensive   Destruction method: electrodesiccation and curettage   Informed consent: discussed and consent obtained   Timeout:  patient name, date of birth, surgical site, and procedure verified Procedure prep:  Patient was prepped and draped in usual sterile fashion Prep type:  Isopropyl alcohol Anesthesia: the lesion was anesthetized in a standard fashion   Anesthetic:  1% lidocaine w/ epinephrine 1-100,000 buffered w/ 8.4% NaHCO3 Curettage performed in three different directions: Yes   Electrodesiccation performed over the curetted area: Yes   Lesion length (cm):  1.1 Lesion width (cm):  1.1 Margin per side (cm):  0.2 Final wound size (cm):  1.5 Hemostasis achieved with:  pressure, aluminum chloride and electrodesiccation Outcome: patient tolerated procedure well with no complications   Post-procedure details: sterile dressing applied and wound care instructions given   Dressing type: bandage, petrolatum and bacitracin    BCC bx  proven    Return for as scheduled for TBSE, Hx of BCC, Hx of SCC, Hx of Dysplastic nevi, Hx of AKs.  I, Ardis Rowan, RMA, am acting as scribe for Armida Sans, MD .   Documentation: I have reviewed the above documentation for accuracy and completeness, and I agree with the above.  Armida Sans, MD

## 2022-10-20 NOTE — Patient Instructions (Signed)

## 2022-10-21 ENCOUNTER — Encounter: Payer: Self-pay | Admitting: Dermatology

## 2022-11-06 ENCOUNTER — Encounter: Payer: Self-pay | Admitting: Pharmacist

## 2022-11-06 ENCOUNTER — Encounter: Payer: Self-pay | Admitting: Internal Medicine

## 2022-11-06 ENCOUNTER — Ambulatory Visit: Payer: Medicare HMO | Admitting: Internal Medicine

## 2022-11-06 ENCOUNTER — Other Ambulatory Visit: Payer: Self-pay

## 2022-11-06 VITALS — BP 118/70 | HR 64 | Temp 98.0°F | Resp 16 | Ht 61.0 in | Wt 175.0 lb

## 2022-11-06 DIAGNOSIS — Z83719 Family history of colon polyps, unspecified: Secondary | ICD-10-CM | POA: Diagnosis not present

## 2022-11-06 DIAGNOSIS — R739 Hyperglycemia, unspecified: Secondary | ICD-10-CM

## 2022-11-06 DIAGNOSIS — Z23 Encounter for immunization: Secondary | ICD-10-CM

## 2022-11-06 DIAGNOSIS — E220 Acromegaly and pituitary gigantism: Secondary | ICD-10-CM | POA: Diagnosis not present

## 2022-11-06 DIAGNOSIS — I10 Essential (primary) hypertension: Secondary | ICD-10-CM

## 2022-11-06 DIAGNOSIS — E039 Hypothyroidism, unspecified: Secondary | ICD-10-CM | POA: Diagnosis not present

## 2022-11-06 DIAGNOSIS — K760 Fatty (change of) liver, not elsewhere classified: Secondary | ICD-10-CM

## 2022-11-06 DIAGNOSIS — E7800 Pure hypercholesterolemia, unspecified: Secondary | ICD-10-CM

## 2022-11-06 DIAGNOSIS — D352 Benign neoplasm of pituitary gland: Secondary | ICD-10-CM | POA: Diagnosis not present

## 2022-11-06 LAB — HEPATIC FUNCTION PANEL
ALT: 39 U/L — ABNORMAL HIGH (ref 0–35)
AST: 28 U/L (ref 0–37)
Albumin: 4.1 g/dL (ref 3.5–5.2)
Alkaline Phosphatase: 96 U/L (ref 39–117)
Bilirubin, Direct: 0.1 mg/dL (ref 0.0–0.3)
Total Bilirubin: 0.6 mg/dL (ref 0.2–1.2)
Total Protein: 6 g/dL (ref 6.0–8.3)

## 2022-11-06 LAB — LIPID PANEL
Cholesterol: 157 mg/dL (ref 0–200)
HDL: 36.9 mg/dL — ABNORMAL LOW (ref >39.00)
LDL Cholesterol: 61 mg/dL (ref 0–99)
NonHDL: 119.75
Total CHOL/HDL Ratio: 4
Triglycerides: 292 mg/dL — ABNORMAL HIGH (ref 0.0–149.0)
VLDL: 58.4 mg/dL — ABNORMAL HIGH (ref 0.0–40.0)

## 2022-11-06 LAB — HEMOGLOBIN A1C: Hgb A1c MFr Bld: 6.1 % (ref 4.6–6.5)

## 2022-11-06 LAB — BASIC METABOLIC PANEL
BUN: 11 mg/dL (ref 6–23)
CO2: 29 meq/L (ref 19–32)
Calcium: 9.4 mg/dL (ref 8.4–10.5)
Chloride: 105 meq/L (ref 96–112)
Creatinine, Ser: 0.96 mg/dL (ref 0.40–1.20)
GFR: 61.29 mL/min (ref >60.00)
Glucose, Bld: 109 mg/dL — ABNORMAL HIGH (ref 70–99)
Potassium: 3.9 meq/L (ref 3.5–5.1)
Sodium: 142 meq/L (ref 135–145)

## 2022-11-06 LAB — TSH: TSH: 2.04 u[IU]/mL (ref 0.35–5.50)

## 2022-11-06 MED ORDER — COMIRNATY 30 MCG/0.3ML IM SUSY
0.3000 mL | PREFILLED_SYRINGE | Freq: Once | INTRAMUSCULAR | 0 refills | Status: AC
  Filled 2022-11-06: qty 0.3, 1d supply, fill #0

## 2022-11-06 NOTE — Progress Notes (Signed)
Subjective:    Patient ID: Marie Colon, female    DOB: 11-Jun-1955, 67 y.o.   MRN: [id number]  Patient here for  Chief Complaint  Patient presents with   Medical Management of Chronic Issues    HPI Here for a scheduled follow up - f/u regarding hypertension, hypercholesterolemia and hyperglycemia. Saw Dr Tedd Sias 05/26/22 - previous elevated GH. Taking and tolerating cabergoline. Stable. Seeing dermatology.  Being followed for basal ceall and squamous cell CA.  Tries to stay active.  No chest pain or sob reported.  Bowels stable.  Does report dry skin.  Using sera ve.  Handling stress.     Past Medical History:  Diagnosis Date   Acromegaly (HCC)    Actinic keratosis    Angiomyolipoma of kidney    evaluated by Dr Lonna Cobb   Basal cell carcinoma 10/01/2022   L forehead, EDC 10/20/22   Fatty liver    GERD (gastroesophageal reflux disease)    Heart murmur    per Dr. Dale [place]   History of pyelonephritis    History of ulcerative colitis    Hx of dysplastic nevus 04/21/2016   L upper back, moderate to severe atypia   Hypercholesterolemia    Hypertension    Hypothyroidism    Pituitary macroadenoma (HCC)    s/p removal (elevated growth hormone)   squamous cell carcinoma of skin 07/20/2022   mid frontal scalp, MOHs 08/26/22   Tubal pregnancy    s/p rupture   Wears glasses    Past Surgical History:  Procedure Laterality Date   ABDOMINAL HYSTERECTOMY  2003   abnormal uterine bleeding   APPENDECTOMY     CHOLECYSTECTOMY  2005   COLONOSCOPY WITH PROPOFOL N/A 03/18/2017   Procedure: COLONOSCOPY WITH PROPOFOL;  Surgeon: Christena Deem, MD;  Location: [facility] ENDOSCOPY;  Service: Endoscopy;  Laterality: N/A;   DILATION AND CURETTAGE OF UTERUS     MASS EXCISION Right 08/29/2015   Procedure: EXCISION OF RIGHT THIGH MASS SUBFASICAL 15 CM;  Surgeon: Almond Lint, MD;  Location: [facility] OR;  Service: General;  Laterality: Right;   pituitary tumor removal  2006   Family History   Problem Relation Age of Onset   Brain cancer Father        died - 75   Diabetes Mother    Hypothyroidism Sister    Breast cancer Sister 46   Sjogren's syndrome Sister    Breast cancer Sister 56   Diabetes Other        grandmother   Social History   Socioeconomic History   Marital status: Married    Spouse name: Not on file   Number of children: 2   Years of education: Not on file   Highest education level: 12th grade  Occupational History    Employer: [employer]    Comment: [employer] - [employer]  Tobacco Use   Smoking status: Never   Smokeless tobacco: Never  Vaping Use   Vaping status: Never Used  Substance and Sexual Activity   Alcohol use: No    Alcohol/week: 0.0 standard drinks of alcohol   Drug use: No   Sexual activity: Not on file  Other Topics Concern   Not on file  Social History Narrative   Not on file   Social Determinants of Health   Financial Resource Strain: Low Risk  (11/05/2022)   Overall Financial Resource Strain (CARDIA)    Difficulty of Paying Living Expenses: Not hard at all  Food Insecurity: No Food Insecurity (  11/05/2022)   Hunger Vital Sign    Worried About Running Out of Food in the Last Year: Never true    Ran Out of Food in the Last Year: Never true  Transportation Needs: No Transportation Needs (11/05/2022)   PRAPARE - Administrator, Civil Service (Medical): No    Lack of Transportation (Non-Medical): No  Physical Activity: Insufficiently Active (11/05/2022)   Exercise Vital Sign    Days of Exercise per Week: 2 days    Minutes of Exercise per Session: 20 min  Stress: No Stress Concern Present (11/05/2022)   [employer] of Occupational Health - Occupational Stress Questionnaire    Feeling of Stress : Not at all  Social Connections: Socially Integrated (11/05/2022)   Social Connection and Isolation Panel [NHANES]    Frequency of Communication with Friends and Family: More than three times a week    Frequency of Social  Gatherings with Friends and Family: Twice a week    Attends Religious Services: More than 4 times per year    Active Member of [employer] or Organizations: Yes    Attends Banker Meetings: 1 to 4 times per year    Marital Status: Married     Review of Systems  Constitutional:  Negative for appetite change and unexpected weight change.  HENT:  Negative for congestion and sinus pressure.   Respiratory:  Negative for cough, chest tightness and shortness of breath.   Cardiovascular:  Negative for chest pain and palpitations.  Gastrointestinal:  Negative for abdominal pain, diarrhea, nausea and vomiting.  Genitourinary:  Negative for difficulty urinating and dysuria.  Musculoskeletal:  Negative for joint swelling and myalgias.  Skin:  Negative for color change and rash.  Neurological:  Negative for dizziness and headaches.  Psychiatric/Behavioral:  Negative for agitation and dysphoric mood.        Objective:     BP 118/70   Pulse 64   Temp 98 F (36.7 C)   Resp 16   Ht 5\' 1"  (1.549 m)   Wt 175 lb (79.4 kg)   SpO2 98%   BMI 33.07 kg/m  Wt Readings from Last 3 Encounters:  11/06/22 175 lb (79.4 kg)  08/14/22 167 lb (75.8 kg)  08/12/22 167 lb 6.4 oz (75.9 kg)    Physical Exam Vitals reviewed.  Constitutional:      General: She is not in acute distress.    Appearance: Normal appearance.  HENT:     Head: Normocephalic and atraumatic.     Right Ear: External ear normal.     Left Ear: External ear normal.  Eyes:     General: No scleral icterus.       Right eye: No discharge.        Left eye: No discharge.     Conjunctiva/sclera: Conjunctivae normal.  Neck:     Thyroid: No thyromegaly.  Cardiovascular:     Rate and Rhythm: Normal rate and regular rhythm.  Pulmonary:     Effort: No respiratory distress.     Breath sounds: Normal breath sounds. No wheezing.  Abdominal:     General: Bowel sounds are normal.     Palpations: Abdomen is soft.     Tenderness:  There is no abdominal tenderness.  Musculoskeletal:        General: No swelling or tenderness.     Cervical back: Neck supple. No tenderness.  Lymphadenopathy:     Cervical: No cervical adenopathy.  Skin:    Findings: No  erythema or rash.  Neurological:     Mental Status: She is alert.  Psychiatric:        Mood and Affect: Mood normal.        Behavior: Behavior normal.      Outpatient Encounter Medications as of 11/06/2022  Medication Sig   allopurinol (ZYLOPRIM) 100 MG tablet Take 1 tablet (100 mg total) by mouth daily.   aspirin EC 81 MG tablet Take 81 mg by mouth daily.   atorvastatin (LIPITOR) 40 MG tablet Take 1 tablet (40 mg total) by mouth once daily   cabergoline (DOSTINEX) 0.5 MG tablet Take 0.5 tablets (0.25 mg total) by mouth daily.   lansoprazole (PREVACID) 30 MG capsule Take 1 capsule (30 mg total) by mouth daily at 12:00 noon.   levothyroxine (SYNTHROID) 100 MCG tablet Take 1 tablet (100 mcg total) by mouth daily before breakfast.   metoprolol tartrate (LOPRESSOR) 25 MG tablet Take 1 tablet (25 mg total) by mouth 2 (two) times daily   Multiple Vitamin (MULTIVITAMIN) tablet Take 1 tablet by mouth daily.   [DISCONTINUED] benzonatate (TESSALON PERLES) 100 MG capsule Take 1 capsule (100 mg total) by mouth 3 (three) times daily as needed for cough.   [DISCONTINUED] guaiFENesin-codeine (ROBITUSSIN AC) 100-10 MG/5ML syrup Take 5 mLs by mouth at bedtime as needed for cough.   [DISCONTINUED] predniSONE (DELTASONE) 10 MG tablet Take 4 tablets x 1 day and then decrease by 1/2 tablet per day until down to zero mg.   [DISCONTINUED] triamcinolone (NASACORT) 55 MCG/ACT AERO nasal inhaler Place 2 sprays into the nose daily.   No facility-administered encounter medications on file as of 11/06/2022.     Lab Results  Component Value Date   WBC 8.1 07/07/2022   HGB 13.5 07/07/2022   HCT 40.3 07/07/2022   PLT 199.0 07/07/2022   GLUCOSE 109 (H) 11/06/2022   CHOL 157 11/06/2022    TRIG 292.0 (H) 11/06/2022   HDL 36.90 (L) 11/06/2022   LDLDIRECT 89.0 07/07/2022   LDLCALC 61 11/06/2022   ALT 39 (H) 11/06/2022   AST 28 11/06/2022   NA 142 11/06/2022   K 3.9 11/06/2022   CL 105 11/06/2022   CREATININE 0.96 11/06/2022   BUN 11 11/06/2022   CO2 29 11/06/2022   TSH 2.04 11/06/2022   HGBA1C 6.1 11/06/2022    MM 3D DIAGNOSTIC MAMMOGRAM BILATERAL BREAST  Result Date: 07/24/2022 CLINICAL DATA:  Here for evaluation of focal pain in the left breast as well as annual mammogram of the right breast. EXAM: DIGITAL DIAGNOSTIC BILATERAL MAMMOGRAM WITH TOMOSYNTHESIS AND CAD; ULTRASOUND LEFT BREAST LIMITED TECHNIQUE: Bilateral digital diagnostic mammography and breast tomosynthesis was performed. The images were evaluated with computer-aided detection. ; Targeted ultrasound examination of the left breast was performed. COMPARISON:  Previous exam(s). ACR Breast Density Category a: The breasts are almost entirely fatty. FINDINGS: Spot compression was obtained over the focal area of pain in the left breast. No suspicious mammographic finding is identified in this area. No suspicious mass, microcalcification, or other finding is identified in either breast. Targeted left breast ultrasound was performed in the area of pain from the 8-10 o'clock position. No suspicious solid or cystic mass is identified. No findings to explain the patient's symptoms. IMPRESSION: No evidence of malignancy or findings to explain the patient's symptoms. RECOMMENDATION: Any further workup of the patient's symptoms should be based on the clinical assessment. Recommend routine annual screening mammogram in 1 year. I have discussed the findings and recommendations with the patient.  If applicable, a reminder letter will be sent to the patient regarding the next appointment. BI-RADS CATEGORY  1: Negative. Electronically Signed   By: Romona Curls M.D.   On: 07/24/2022 12:52  [place] LIMITED ULTRASOUND INCLUDING AXILLA LEFT BREAST    Result Date: 07/24/2022 CLINICAL DATA:  Here for evaluation of focal pain in the left breast as well as annual mammogram of the right breast. EXAM: DIGITAL DIAGNOSTIC BILATERAL MAMMOGRAM WITH TOMOSYNTHESIS AND CAD; ULTRASOUND LEFT BREAST LIMITED TECHNIQUE: Bilateral digital diagnostic mammography and breast tomosynthesis was performed. The images were evaluated with computer-aided detection. ; Targeted ultrasound examination of the left breast was performed. COMPARISON:  Previous exam(s). ACR Breast Density Category a: The breasts are almost entirely fatty. FINDINGS: Spot compression was obtained over the focal area of pain in the left breast. No suspicious mammographic finding is identified in this area. No suspicious mass, microcalcification, or other finding is identified in either breast. Targeted left breast ultrasound was performed in the area of pain from the 8-10 o'clock position. No suspicious solid or cystic mass is identified. No findings to explain the patient's symptoms. IMPRESSION: No evidence of malignancy or findings to explain the patient's symptoms. RECOMMENDATION: Any further workup of the patient's symptoms should be based on the clinical assessment. Recommend routine annual screening mammogram in 1 year. I have discussed the findings and recommendations with the patient. If applicable, a reminder letter will be sent to the patient regarding the next appointment. BI-RADS CATEGORY  1: Negative. Electronically Signed   By: Romona Curls M.D.   On: 07/24/2022 12:52      Assessment & Plan:  Hypercholesteremia Assessment & Plan: On pravastatin.  Low cholesterol diet and exercise.  Follow lipid panel and liver function tests.   Orders: -     Hepatic function panel -     Lipid panel  Hyperglycemia Assessment & Plan: Low carb diet and exercise.  Follow met b and a1c.   Orders: -     Hemoglobin A1c  Primary hypertension Assessment & Plan: Continue metoprolol and triam/hctz.   Blood pressure as outlined. Follow metabolic panel.   Orders: -     Basic metabolic panel  Hypothyroidism, unspecified type Assessment & Plan: On thyroid replacement.  Follow tsh.   Orders: -     TSH  Need for influenza vaccination -     Flu Vaccine Trivalent High Dose (Fluad)  Pituitary macroadenoma (HCC) Assessment & Plan: Previous - elevated GH - acromegaly.  Followed by Dr Tedd Sias.  On cabergoline.  Recommended f/u in year.    Fatty liver Assessment & Plan: Diet and exercise.  Has seen GI. Follow liver function tests.     Family history of colonic polyps Assessment & Plan: Colonoscopy 08/2017 - one polyp ascending colon.  Has appt with GI 11/2022.    Acromegaly [facility]) Assessment & Plan: Continues on cabergoline.  Followed by endocrinology.        Dale Covelo, MD

## 2022-11-07 ENCOUNTER — Encounter: Payer: Self-pay | Admitting: Internal Medicine

## 2022-11-07 NOTE — Assessment & Plan Note (Signed)
Diet and exercise.  Has seen GI.  Follow liver function tests.

## 2022-11-07 NOTE — Assessment & Plan Note (Signed)
On thyroid replacement.  Follow tsh.  

## 2022-11-07 NOTE — Assessment & Plan Note (Signed)
Low carb diet and exercise.  Follow met b and a1c.   

## 2022-11-07 NOTE — Assessment & Plan Note (Signed)
Colonoscopy 08/2017 - one polyp ascending colon.  Has appt with GI 11/2022.

## 2022-11-07 NOTE — Assessment & Plan Note (Signed)
Previous - elevated GH - acromegaly.  Followed by Dr Tedd Sias.  On cabergoline.  Recommended f/u in year.

## 2022-11-07 NOTE — Assessment & Plan Note (Signed)
Continues on cabergoline.  Followed by endocrinology.

## 2022-11-07 NOTE — Assessment & Plan Note (Signed)
Continue metoprolol and triam/hctz.  Blood pressure as outlined. Follow metabolic panel.

## 2022-11-07 NOTE — Assessment & Plan Note (Signed)
On pravastatin.  Low cholesterol diet and exercise.  Follow lipid panel and liver function tests.

## 2022-11-25 DIAGNOSIS — E220 Acromegaly and pituitary gigantism: Secondary | ICD-10-CM | POA: Diagnosis not present

## 2022-11-30 ENCOUNTER — Other Ambulatory Visit: Payer: Self-pay

## 2022-11-30 DIAGNOSIS — Z87898 Personal history of other specified conditions: Secondary | ICD-10-CM | POA: Diagnosis not present

## 2022-11-30 DIAGNOSIS — E220 Acromegaly and pituitary gigantism: Secondary | ICD-10-CM | POA: Diagnosis not present

## 2022-11-30 DIAGNOSIS — E039 Hypothyroidism, unspecified: Secondary | ICD-10-CM | POA: Diagnosis not present

## 2022-11-30 DIAGNOSIS — R7303 Prediabetes: Secondary | ICD-10-CM | POA: Diagnosis not present

## 2022-11-30 MED ORDER — CABERGOLINE 0.5 MG PO TABS
0.2500 mg | ORAL_TABLET | Freq: Every day | ORAL | 3 refills | Status: DC
  Filled 2022-11-30 – 2023-04-05 (×3): qty 40, 80d supply, fill #0
  Filled 2023-06-25: qty 40, 80d supply, fill #1
  Filled 2023-11-05: qty 40, 80d supply, fill #2
  Filled 2023-11-12: qty 40, 80d supply, fill #3

## 2022-12-07 ENCOUNTER — Encounter: Payer: Self-pay | Admitting: Dermatology

## 2022-12-08 DIAGNOSIS — K580 Irritable bowel syndrome with diarrhea: Secondary | ICD-10-CM | POA: Diagnosis not present

## 2022-12-08 DIAGNOSIS — Z860101 Personal history of adenomatous and serrated colon polyps: Secondary | ICD-10-CM | POA: Diagnosis not present

## 2022-12-08 DIAGNOSIS — K760 Fatty (change of) liver, not elsewhere classified: Secondary | ICD-10-CM | POA: Diagnosis not present

## 2022-12-09 ENCOUNTER — Other Ambulatory Visit: Payer: Self-pay | Admitting: Gastroenterology

## 2022-12-09 DIAGNOSIS — K760 Fatty (change of) liver, not elsewhere classified: Secondary | ICD-10-CM

## 2022-12-14 ENCOUNTER — Ambulatory Visit
Admission: RE | Admit: 2022-12-14 | Discharge: 2022-12-14 | Disposition: A | Discharge status: Home / Self Care | Payer: Medicare HMO | Source: Ambulatory Visit | Attending: Gastroenterology | Admitting: Gastroenterology

## 2022-12-14 ENCOUNTER — Ambulatory Visit: Admission: RE | Admit: 2022-12-14 | Payer: Medicare HMO | Source: Ambulatory Visit

## 2022-12-14 DIAGNOSIS — Z9049 Acquired absence of other specified parts of digestive tract: Secondary | ICD-10-CM | POA: Diagnosis not present

## 2022-12-14 DIAGNOSIS — K760 Fatty (change of) liver, not elsewhere classified: Secondary | ICD-10-CM | POA: Diagnosis not present

## 2023-01-03 ENCOUNTER — Other Ambulatory Visit: Payer: Self-pay

## 2023-01-03 ENCOUNTER — Other Ambulatory Visit: Payer: Self-pay | Admitting: Internal Medicine

## 2023-01-04 ENCOUNTER — Other Ambulatory Visit: Payer: Self-pay

## 2023-01-04 MED ORDER — LEVOTHYROXINE SODIUM 100 MCG PO TABS
100.0000 ug | ORAL_TABLET | Freq: Every day | ORAL | 3 refills | Status: DC
  Filled 2023-01-04 – 2023-03-11 (×3): qty 90, 90d supply, fill #0
  Filled 2023-06-08: qty 90, 90d supply, fill #1
  Filled 2023-08-28: qty 90, 90d supply, fill #2
  Filled 2023-11-12 – 2023-11-15 (×2): qty 90, 90d supply, fill #3

## 2023-01-05 ENCOUNTER — Other Ambulatory Visit: Payer: Self-pay

## 2023-01-07 ENCOUNTER — Other Ambulatory Visit: Payer: Self-pay

## 2023-01-07 ENCOUNTER — Ambulatory Visit: Payer: Medicare HMO

## 2023-01-07 DIAGNOSIS — Z860101 Personal history of adenomatous and serrated colon polyps: Secondary | ICD-10-CM | POA: Diagnosis not present

## 2023-01-07 DIAGNOSIS — K640 First degree hemorrhoids: Secondary | ICD-10-CM | POA: Diagnosis not present

## 2023-01-07 DIAGNOSIS — Z1211 Encounter for screening for malignant neoplasm of colon: Secondary | ICD-10-CM | POA: Diagnosis not present

## 2023-01-07 DIAGNOSIS — Z09 Encounter for follow-up examination after completed treatment for conditions other than malignant neoplasm: Secondary | ICD-10-CM | POA: Diagnosis not present

## 2023-01-07 DIAGNOSIS — Z860100 Personal history of colon polyps, unspecified: Secondary | ICD-10-CM | POA: Diagnosis not present

## 2023-01-10 ENCOUNTER — Other Ambulatory Visit: Payer: Self-pay

## 2023-01-11 ENCOUNTER — Other Ambulatory Visit: Payer: Self-pay

## 2023-01-12 ENCOUNTER — Other Ambulatory Visit: Payer: Self-pay

## 2023-01-12 ENCOUNTER — Other Ambulatory Visit: Payer: Self-pay | Admitting: Internal Medicine

## 2023-01-12 MED FILL — Metoprolol Tartrate Tab 25 MG: ORAL | 90 days supply | Qty: 180 | Fill #0 | Status: AC

## 2023-01-12 MED FILL — Atorvastatin Calcium Tab 40 MG (Base Equivalent): ORAL | 90 days supply | Qty: 90 | Fill #0 | Status: AC

## 2023-01-14 ENCOUNTER — Other Ambulatory Visit: Payer: Self-pay

## 2023-03-03 DIAGNOSIS — E220 Acromegaly and pituitary gigantism: Secondary | ICD-10-CM | POA: Diagnosis not present

## 2023-03-04 ENCOUNTER — Encounter: Payer: Self-pay | Admitting: Internal Medicine

## 2023-03-04 ENCOUNTER — Ambulatory Visit: Payer: Medicare Other | Admitting: Internal Medicine

## 2023-03-04 VITALS — BP 132/82 | HR 66 | Temp 98.0°F | Resp 16 | Ht 61.0 in | Wt 175.4 lb

## 2023-03-04 DIAGNOSIS — E7800 Pure hypercholesterolemia, unspecified: Secondary | ICD-10-CM

## 2023-03-04 DIAGNOSIS — D352 Benign neoplasm of pituitary gland: Secondary | ICD-10-CM

## 2023-03-04 DIAGNOSIS — R739 Hyperglycemia, unspecified: Secondary | ICD-10-CM

## 2023-03-04 DIAGNOSIS — E220 Acromegaly and pituitary gigantism: Secondary | ICD-10-CM | POA: Diagnosis not present

## 2023-03-04 DIAGNOSIS — I10 Essential (primary) hypertension: Secondary | ICD-10-CM

## 2023-03-04 DIAGNOSIS — K760 Fatty (change of) liver, not elsewhere classified: Secondary | ICD-10-CM | POA: Diagnosis not present

## 2023-03-04 DIAGNOSIS — E039 Hypothyroidism, unspecified: Secondary | ICD-10-CM

## 2023-03-04 NOTE — Assessment & Plan Note (Signed)
 Previous - elevated GH - acromegaly.  Followed by Dr Lorelei Rogers.  On cabergoline .

## 2023-03-04 NOTE — Progress Notes (Signed)
 Subjective:    Patient ID: Marie Colon, female    DOB: 12/28/1955, 68 y.o.   MRN: [id number]  Patient here for  Chief Complaint  Patient presents with   Medical Management of Chronic Issues    HPI Here for a scheduled follow up - f/u regarding hypertension, hypercholesterolemia and hyperglycemia. Saw GI 12/08/22 - recommended f/u colonoscopy. Recommended daily psyllium for IBSD. Had f/u - Dr Damian - f/u acromegaly. IGF-1 controlled. Per note, cabergoline  reduced to every other day. Reports increased stress.  Trying to help take care of her aunt. Discussed. Overall she feels she is handling things relatively well. Breathing stable. Recent sinus symptoms. Taking mucinex  and using nasal spray. Improved. Previous diarrhea. No diarrhea now. No vomiting.    Past Medical History:  Diagnosis Date   Acromegaly (HCC)    Actinic keratosis    Angiomyolipoma of kidney    evaluated by Dr Twylla   Basal cell carcinoma 10/01/2022   L forehead, EDC 10/20/22   Fatty liver    GERD (gastroesophageal reflux disease)    Heart murmur    per Dr. Allena Hamilton   History of pyelonephritis    History of ulcerative colitis    Hx of dysplastic nevus 04/21/2016   L upper back, moderate to severe atypia   Hypercholesterolemia    Hypertension    Hypothyroidism    Pituitary macroadenoma (HCC)    s/p removal (elevated growth hormone)   squamous cell carcinoma of skin 07/20/2022   mid frontal scalp, MOHs 08/26/22   Tubal pregnancy    s/p rupture   Wears glasses    Past Surgical History:  Procedure Laterality Date   ABDOMINAL HYSTERECTOMY  2003   abnormal uterine bleeding   APPENDECTOMY     CHOLECYSTECTOMY  2005   COLONOSCOPY WITH PROPOFOL  N/A 03/18/2017   Procedure: COLONOSCOPY WITH PROPOFOL ;  Surgeon: Gaylyn Gladis PENNER, MD;  Location: [facility] ENDOSCOPY;  Service: Endoscopy;  Laterality: N/A;   DILATION AND CURETTAGE OF UTERUS     MASS EXCISION Right 08/29/2015   Procedure: EXCISION OF RIGHT  THIGH MASS SUBFASICAL 15 CM;  Surgeon: Jina Nephew, MD;  Location: [facility] OR;  Service: General;  Laterality: Right;   pituitary tumor removal  2006   Family History  Problem Relation Age of Onset   Brain cancer Father        died - 57   Diabetes Mother    Hypothyroidism Sister    Breast cancer Sister 15   Sjogren's syndrome Sister    Breast cancer Sister 10   Diabetes Other        grandmother   Social History   Socioeconomic History   Marital status: Married    Spouse name: Not on file   Number of children: 2   Years of education: Not on file   Highest education level: 12th grade  Occupational History    Employer: [employer]    Comment: [employer] - [employer]  Tobacco Use   Smoking status: Never   Smokeless tobacco: Never  Vaping Use   Vaping status: Never Used  Substance and Sexual Activity   Alcohol use: No    Alcohol/week: 0.0 standard drinks of alcohol   Drug use: No   Sexual activity: Not on file  Other Topics Concern   Not on file  Social History Narrative   Not on file   Social Drivers of Health   Financial Resource Strain: Low Risk  (12/08/2022)   Received from [facility]   Overall Financial Resource Strain (CARDIA)    Difficulty of Paying Living Expenses: Not hard at all  Food Insecurity: No Food Insecurity (12/08/2022)   Received from [facility]   Hunger Vital Sign    Worried About Running Out of Food in the Last Year: Never true    Ran Out of Food in the Last Year: Never true  Transportation Needs: No Transportation Needs (12/08/2022)   Received from [facility] - Transportation    In the past 12 months, has lack of transportation kept you from medical appointments or from getting medications?: No    Lack of Transportation (Non-Medical): No  Physical Activity: Insufficiently Active (11/05/2022)   Exercise Vital Sign    Days of Exercise per Week: 2 days    Minutes of Exercise per Session: 20 min   Stress: No Stress Concern Present (11/05/2022)   [employer] of Occupational Health - Occupational Stress Questionnaire    Feeling of Stress : Not at all  Social Connections: Socially Integrated (11/05/2022)   Social Connection and Isolation Panel [NHANES]    Frequency of Communication with Friends and Family: More than three times a week    Frequency of Social Gatherings with Friends and Family: Twice a week    Attends Religious Services: More than 4 times per year    Active Member of [employer] or Organizations: Yes    Attends Banker Meetings: 1 to 4 times per year    Marital Status: Married     Review of Systems  Constitutional:  Negative for appetite change and unexpected weight change.  HENT:  Negative for congestion and sinus pressure.   Respiratory:  Negative for cough, chest tightness and shortness of breath.   Cardiovascular:  Negative for chest pain, palpitations and leg swelling.  Gastrointestinal:  Negative for abdominal pain, diarrhea, nausea and vomiting.  Genitourinary:  Negative for difficulty urinating and dysuria.  Musculoskeletal:  Negative for joint swelling and myalgias.  Skin:  Negative for color change and rash.  Neurological:  Negative for dizziness and headaches.  Psychiatric/Behavioral:  Negative for agitation and dysphoric mood.        Increased stress as outlined.        Objective:     BP 132/82   Pulse 66   Temp 98 F (36.7 C)   Resp 16   Ht 5' 1 (1.549 m)   Wt 175 lb 6.4 oz (79.6 kg)   SpO2 97%   BMI 33.14 kg/m  Wt Readings from Last 3 Encounters:  03/04/23 175 lb 6.4 oz (79.6 kg)  11/06/22 175 lb (79.4 kg)  08/14/22 167 lb (75.8 kg)    Physical Exam Vitals reviewed.  Constitutional:      General: She is not in acute distress.    Appearance: Normal appearance.  HENT:     Head: Normocephalic and atraumatic.     Right Ear: External ear normal.     Left Ear: External ear normal.     Mouth/Throat:     Pharynx: No  oropharyngeal exudate or posterior oropharyngeal erythema.  Eyes:     General: No scleral icterus.       Right eye: No discharge.        Left eye: No discharge.     Conjunctiva/sclera: Conjunctivae normal.  Neck:     Thyroid : No thyromegaly.  Cardiovascular:     Rate and Rhythm: Normal rate and regular rhythm.  Pulmonary:  Effort: No respiratory distress.     Breath sounds: Normal breath sounds. No wheezing.  Abdominal:     General: Bowel sounds are normal.     Palpations: Abdomen is soft.     Tenderness: There is no abdominal tenderness.  Musculoskeletal:        General: No swelling or tenderness.     Cervical back: Neck supple. No tenderness.  Lymphadenopathy:     Cervical: No cervical adenopathy.  Skin:    Findings: No erythema or rash.  Neurological:     Mental Status: She is alert.  Psychiatric:        Mood and Affect: Mood normal.        Behavior: Behavior normal.         Outpatient Encounter Medications as of 03/04/2023  Medication Sig   allopurinol  (ZYLOPRIM ) 100 MG tablet Take 1 tablet (100 mg total) by mouth daily.   aspirin EC 81 MG tablet Take 81 mg by mouth daily.   atorvastatin  (LIPITOR) 40 MG tablet Take 1 tablet (40 mg total) by mouth daily.   cabergoline  (DOSTINEX ) 0.5 MG tablet Take 0.5 tablets (0.25 mg total) by mouth daily.   cabergoline  (DOSTINEX ) 0.5 MG tablet Take 0.5 tablets (0.25 mg total) by mouth daily.   lansoprazole  (PREVACID ) 30 MG capsule Take 1 capsule (30 mg total) by mouth daily at 12:00 noon.   levothyroxine  (SYNTHROID ) 100 MCG tablet Take 1 tablet (100 mcg total) by mouth daily before breakfast.   metoprolol  tartrate (LOPRESSOR ) 25 MG tablet Take 1 tablet (25 mg total) by mouth 2 (two) times daily   Multiple Vitamin (MULTIVITAMIN) tablet Take 1 tablet by mouth daily.   No facility-administered encounter medications on file as of 03/04/2023.     Lab Results  Component Value Date   WBC 8.1 07/07/2022   HGB 13.5 07/07/2022   HCT  40.3 07/07/2022   PLT 199.0 07/07/2022   GLUCOSE 109 (H) 11/06/2022   CHOL 157 11/06/2022   TRIG 292.0 (H) 11/06/2022   HDL 36.90 (L) 11/06/2022   LDLDIRECT 89.0 07/07/2022   LDLCALC 61 11/06/2022   ALT 39 (H) 11/06/2022   AST 28 11/06/2022   NA 142 11/06/2022   K 3.9 11/06/2022   CL 105 11/06/2022   CREATININE 0.96 11/06/2022   BUN 11 11/06/2022   CO2 29 11/06/2022   TSH 2.04 11/06/2022   HGBA1C 6.1 11/06/2022    US  Abdomen Limited RUQ (LIVER/GB) Result Date: 12/14/2022 CLINICAL DATA:  Fatty liver EXAM: ULTRASOUND ABDOMEN LIMITED RIGHT UPPER QUADRANT COMPARISON:  Renal ultrasound 07/28/2021 FINDINGS: Gallbladder: Surgically absent Common bile duct: Diameter: 4 mm Liver: Increased echogenicity. No focal lesion. Portal vein is patent on color Doppler imaging with normal direction of blood flow towards the liver. Other: Similar-appearing 1 cm echogenic mass inferior pole right kidney, potentially angiomyolipoma. IMPRESSION: 1. Increased hepatic parenchymal echogenicity suggestive of steatosis. 2. Similar-appearing 1 cm echogenic mass inferior pole right kidney, potentially angiomyolipoma. Electronically Signed   By: Bard Moats M.D.   On: 12/14/2022 12:34       Assessment & Plan:  Hypercholesteremia Assessment & Plan: On pravastatin .  Low cholesterol diet and exercise.  Follow lipid panel and liver function tests.   Orders: -     Lipid panel; Future -     Hepatic function panel; Future -     Basic metabolic panel; Future  Acromegaly [facility]) Assessment & Plan: Continues on cabergoline .  Followed by endocrinology.     Fatty liver Assessment &  Plan: Diet and exercise.  Has seen GI. Follow liver function tests.     Hyperglycemia Assessment & Plan: Low carb diet and exercise.  Follow met b and a1c.    Primary hypertension Assessment & Plan: Continue metoprolol  and triam/hctz.  Blood pressure as outlined. Initial blood pressure elevated. Increased stress prior to coming in  for appt.  Recheck improved. Spot check pressures and send in readings. Follow metabolic panel.    Hypothyroidism, unspecified type Assessment & Plan: On thyroid  replacement.  Follow tsh.    Pituitary macroadenoma (HCC) Assessment & Plan: Previous - elevated GH - acromegaly.  Followed by Dr Damian.  On cabergoline .       Allena Hamilton, MD

## 2023-03-04 NOTE — Assessment & Plan Note (Addendum)
 Continue metoprolol  and triam/hctz.  Blood pressure as outlined. Initial blood pressure elevated. Increased stress prior to coming in for appt.  Recheck improved. Spot check pressures and send in readings. Follow metabolic panel.

## 2023-03-04 NOTE — Assessment & Plan Note (Signed)
Diet and exercise.  Has seen GI.  Follow liver function tests.

## 2023-03-04 NOTE — Assessment & Plan Note (Signed)
 On thyroid replacement.  Follow tsh.

## 2023-03-04 NOTE — Assessment & Plan Note (Signed)
Continues on cabergoline.  Followed by endocrinology.

## 2023-03-04 NOTE — Assessment & Plan Note (Signed)
 Low carb diet and exercise.  Follow met b and a1c.

## 2023-03-04 NOTE — Assessment & Plan Note (Signed)
On pravastatin.  Low cholesterol diet and exercise.  Follow lipid panel and liver function tests.   

## 2023-03-05 ENCOUNTER — Encounter: Payer: Self-pay | Admitting: Internal Medicine

## 2023-03-10 DIAGNOSIS — Z87898 Personal history of other specified conditions: Secondary | ICD-10-CM | POA: Diagnosis not present

## 2023-03-10 DIAGNOSIS — E220 Acromegaly and pituitary gigantism: Secondary | ICD-10-CM | POA: Diagnosis not present

## 2023-03-11 ENCOUNTER — Ambulatory Visit: Payer: Medicare HMO | Admitting: Internal Medicine

## 2023-03-12 ENCOUNTER — Other Ambulatory Visit: Payer: Self-pay

## 2023-03-22 ENCOUNTER — Other Ambulatory Visit: Payer: Self-pay

## 2023-03-22 ENCOUNTER — Other Ambulatory Visit: Payer: Self-pay | Admitting: Internal Medicine

## 2023-03-22 MED ORDER — LANSOPRAZOLE 30 MG PO CPDR
30.0000 mg | DELAYED_RELEASE_CAPSULE | Freq: Every day | ORAL | 3 refills | Status: AC
  Filled 2023-03-22: qty 90, 90d supply, fill #0
  Filled 2023-08-04: qty 90, 90d supply, fill #1
  Filled 2023-11-05: qty 90, 90d supply, fill #2
  Filled 2023-11-12 – 2024-01-25 (×2): qty 90, 90d supply, fill #3

## 2023-03-23 ENCOUNTER — Other Ambulatory Visit: Payer: Self-pay

## 2023-03-24 ENCOUNTER — Ambulatory Visit (INDEPENDENT_AMBULATORY_CARE_PROVIDER_SITE_OTHER): Payer: Medicare Other | Admitting: *Deleted

## 2023-03-24 VITALS — Ht 61.0 in | Wt 172.0 lb

## 2023-03-24 DIAGNOSIS — Z0000 Encounter for general adult medical examination without abnormal findings: Secondary | ICD-10-CM

## 2023-03-24 NOTE — Patient Instructions (Signed)
 Marie Colon , Thank you for taking time to come for your Medicare Wellness Visit. I appreciate your ongoing commitment to your health goals. Please review the following plan we discussed and let me know if I can assist you in the future.   Referrals/Orders/Follow-Ups/Clinician Recommendations: None  This is a list of the screening recommended for you and due dates:  Health Maintenance  Topic Date Due   COVID-19 Vaccine (7 - 2024-25 season) 01/01/2023   Mammogram  07/24/2023   Medicare Annual Wellness Visit  03/23/2024   DTaP/Tdap/Td vaccine (3 - Td or Tdap) 05/26/2025   Colon Cancer Screening  01/10/2033   Pneumonia Vaccine  Completed   Flu Shot  Completed   DEXA scan (bone density measurement)  Completed   Hepatitis C Screening  Completed   Zoster (Shingles) Vaccine  Completed   HPV Vaccine  Aged Out    Advanced directives: (Copy Requested) Please bring a copy of your health care power of attorney and living will to the office to be added to your chart at your convenience.  Next Medicare Annual Wellness Visit scheduled for next year: Yes 03/27/24 @ 10:10

## 2023-03-24 NOTE — Progress Notes (Signed)
 Subjective:   Marie Colon is a 68 y.o. who presents for a Medicare Wellness preventive visit.  Visit Complete: Virtual I connected with  Daneil Dolin on 03/24/23 by a audio enabled telemedicine application and verified that I am speaking with the correct person using two identifiers.  Patient Location: Home  Provider Location: Office/Clinic  I discussed the limitations of evaluation and management by telemedicine. The patient expressed understanding and agreed to proceed.  Vital Signs: Because this visit was a virtual/telehealth visit, some criteria may be missing or patient reported. Any vitals not documented were not able to be obtained and vitals that have been documented are patient reported.  VideoDeclined- This patient declined Librarian, academic. Therefore the visit was completed with audio only.  AWV Questionnaire: No: Patient Medicare AWV questionnaire was not completed prior to this visit.  Cardiac Risk Factors include: advanced age (>19men, >70 women);dyslipidemia;hypertension;obesity (BMI >30kg/m2)     Objective:    Today's Vitals   03/24/23 1515  Weight: 172 lb (78 kg)  Height: 5\' 1"  (1.549 m)   Body mass index is 32.5 kg/m.     03/24/2023    3:32 PM 03/23/2022    3:05 PM 08/16/2020    4:38 PM 04/30/2020    7:22 PM 02/28/2018    8:19 PM 03/18/2017    6:58 AM 08/22/2015    2:48 PM  Advanced Directives  Does Patient Have a Medical Advance Directive? Yes Yes No Yes No Yes Yes  Type of Estate agent of [place];Living will Healthcare Power of [place];Living will    Living will Healthcare Power of [place];Living will  Does patient want to make changes to medical advance directive?  No - Patient declined     No - Patient declined  Copy of Healthcare Power of Attorney in Chart? No - copy requested No - copy requested     No - copy requested    Current Medications (verified) Outpatient Encounter Medications  as of 03/24/2023  Medication Sig   allopurinol (ZYLOPRIM) 100 MG tablet Take 1 tablet (100 mg total) by mouth daily.   aspirin EC 81 MG tablet Take 81 mg by mouth daily.   atorvastatin (LIPITOR) 40 MG tablet Take 1 tablet (40 mg total) by mouth daily.   cabergoline (DOSTINEX) 0.5 MG tablet Take 0.5 tablets (0.25 mg total) by mouth daily.   cabergoline (DOSTINEX) 0.5 MG tablet Take 0.5 tablets (0.25 mg total) by mouth daily.   lansoprazole (PREVACID) 30 MG capsule Take 1 capsule (30 mg total) by mouth daily at 12:00 noon.   levothyroxine (SYNTHROID) 100 MCG tablet Take 1 tablet (100 mcg total) by mouth daily before breakfast.   metoprolol tartrate (LOPRESSOR) 25 MG tablet Take 1 tablet (25 mg total) by mouth 2 (two) times daily   Multiple Vitamin (MULTIVITAMIN) tablet Take 1 tablet by mouth daily.   No facility-administered encounter medications on file as of 03/24/2023.    Allergies (verified) Protonix [pantoprazole] and Penicillins   History: Past Medical History:  Diagnosis Date   Acromegaly (HCC)    Actinic keratosis    Angiomyolipoma of kidney    evaluated by Dr Lonna Cobb   Basal cell carcinoma 10/01/2022   L forehead, EDC 10/20/22   Fatty liver    GERD (gastroesophageal reflux disease)    Heart murmur    per Dr. Dale Hollenberg   History of pyelonephritis    History of ulcerative colitis    Hx of dysplastic nevus 04/21/2016  L upper back, moderate to severe atypia   Hypercholesterolemia    Hypertension    Hypothyroidism    Pituitary macroadenoma (HCC)    s/p removal (elevated growth hormone)   squamous cell carcinoma of skin 07/20/2022   mid frontal scalp, MOHs 08/26/22   Tubal pregnancy    s/p rupture   Wears glasses    Past Surgical History:  Procedure Laterality Date   ABDOMINAL HYSTERECTOMY  2003   abnormal uterine bleeding   APPENDECTOMY     cancer lesion removed from head  05/2022   CHOLECYSTECTOMY  2005   COLONOSCOPY WITH PROPOFOL N/A 03/18/2017    Procedure: COLONOSCOPY WITH PROPOFOL;  Surgeon: Christena Deem, MD;  Location: [facility] ENDOSCOPY;  Service: Endoscopy;  Laterality: N/A;   DILATION AND CURETTAGE OF UTERUS     MASS EXCISION Right 08/29/2015   Procedure: EXCISION OF RIGHT THIGH MASS SUBFASICAL 15 CM;  Surgeon: Almond Lint, MD;  Location: [facility] OR;  Service: General;  Laterality: Right;   pituitary tumor removal  2006   Family History  Problem Relation Age of Onset   Brain cancer Father        died - 39   Diabetes Mother    Hypothyroidism Sister    Breast cancer Sister 36   Sjogren's syndrome Sister    Breast cancer Sister 51   Diabetes Other        grandmother   Social History   Socioeconomic History   Marital status: Married    Spouse name: Not on file   Number of children: 2   Years of education: Not on file   Highest education level: 12th grade  Occupational History    Employer: [employer]    Comment: [employer] - [employer]  Tobacco Use   Smoking status: Never   Smokeless tobacco: Never  Vaping Use   Vaping status: Never Used  Substance and Sexual Activity   Alcohol use: No    Alcohol/week: 0.0 standard drinks of alcohol   Drug use: No   Sexual activity: Not on file  Other Topics Concern   Not on file  Social History Narrative   Married   Social Drivers of Health   Financial Resource Strain: Low Risk  (03/24/2023)   Overall Financial Resource Strain (CARDIA)    Difficulty of Paying Living Expenses: Not hard at all  Food Insecurity: No Food Insecurity (03/24/2023)   Hunger Vital Sign    Worried About Running Out of Food in the Last Year: Never true    Ran Out of Food in the Last Year: Never true  Transportation Needs: No Transportation Needs (03/24/2023)   PRAPARE - Administrator, Civil Service (Medical): No    Lack of Transportation (Non-Medical): No  Physical Activity: Insufficiently Active (03/24/2023)   Exercise Vital Sign    Days of Exercise per Week: 2 days    Minutes of Exercise per  Session: 20 min  Stress: No Stress Concern Present (03/24/2023)   [employer] of Occupational Health - Occupational Stress Questionnaire    Feeling of Stress : Only a little  Social Connections: Socially Integrated (03/24/2023)   Social Connection and Isolation Panel [NHANES]    Frequency of Communication with Friends and Family: More than three times a week    Frequency of Social Gatherings with Friends and Family: More than three times a week    Attends Religious Services: More than 4 times per year    Active Member of Clubs or Organizations: Yes  Attends Engineer, structural: More than 4 times per year    Marital Status: Married    Tobacco Counseling Counseling given: Not Answered    Clinical Intake:  Pre-visit preparation completed: Yes  Pain : No/denies pain     BMI - recorded: 32.5 Nutritional Status: BMI > 30  Obese Nutritional Risks: None Diabetes: No  How often do you need to have someone help you when you read instructions, pamphlets, or other written materials from your doctor or pharmacy?: 1 - Never  Interpreter Needed?: No  Information entered by :: R. Edmond Ginsberg LPN   Activities of Daily Living     03/24/2023    3:16 PM  In your present state of health, do you have any difficulty performing the following activities:  Hearing? 0  Vision? 0  Comment glasses  Difficulty concentrating or making decisions? 0  Walking or climbing stairs? 0  Dressing or bathing? 0  Doing errands, shopping? 0  Preparing Food and eating ? N  Using the Toilet? N  In the past six months, have you accidently leaked urine? N  Do you have problems with loss of bowel control? N  Managing your Medications? N  Managing your Finances? N  Housekeeping or managing your Housekeeping? N    Patient Care Team: Dale [place], MD as PCP - General (Internal Medicine)  Indicate any recent Medical Services you may have received from other than [facility] providers in the past year  (date may be approximate).     Assessment:   This is a routine wellness examination for Marie Colon.  Hearing/Vision screen Hearing Screening - Comments:: No issues Vision Screening - Comments:: glasses   Goals Addressed             This Visit's Progress    Patient Stated       Wants to try to exercise more       Depression Screen     03/24/2023    3:22 PM 08/12/2022   11:37 AM 03/23/2022    3:05 PM 02/24/2022   10:44 AM 10/27/2021    8:54 AM 06/18/2021    8:02 AM 02/17/2021   11:53 AM  PHQ 2/9 Scores  PHQ - 2 Score 0 0 0 0 0 0 0  PHQ- 9 Score 2          Fall Risk     03/24/2023    3:18 PM 08/12/2022   11:37 AM 03/23/2022    3:04 PM 02/24/2022   10:44 AM 10/27/2021    8:54 AM  Fall Risk   Falls in the past year? 0 0 1 0 0  Number falls in past yr: 0 0 0 0   Injury with Fall? 0 0 0 0   Risk for fall due to : No Fall Risks No Fall Risks  No Fall Risks No Fall Risks  Follow up Falls prevention discussed;Falls evaluation completed Falls evaluation completed Falls evaluation completed;Falls prevention discussed Falls evaluation completed Falls evaluation completed    MEDICARE RISK AT HOME:  Medicare Risk at Home Any stairs in or around the home?: Yes If so, are there any without handrails?: Yes Home free of loose throw rugs in walkways, pet beds, electrical cords, etc?: Yes Adequate lighting in your home to reduce risk of falls?: Yes Life alert?: No Use of a cane, walker or w/c?: No Grab bars in the bathroom?: No Shower chair or bench in shower?: Yes Elevated toilet seat or a handicapped toilet?: No  TIMED  UP AND GO:  Was the test performed?  No  Cognitive Function: 6CIT completed        03/24/2023    3:32 PM 03/23/2022    3:13 PM  6CIT Screen  What Year? 0 points 0 points  What month? 0 points 0 points  What time? 0 points 0 points  Count back from 20 0 points 0 points  Months in reverse 0 points 0 points  Repeat phrase 0 points 0 points  Total Score 0  points 0 points    Immunizations Immunization History  Administered Date(s) Administered   Fluad Quad(high Dose 65+) 10/27/2021   Fluad Trivalent(High Dose 65+) 11/06/2022   Hepatitis B 06/26/2008   Influenza Split 10/09/2013   Influenza-Unspecified 10/10/2012, 10/31/2014, 10/30/2015, 10/05/2016, 10/25/2017, 10/17/2018, 10/24/2019   PFIZER(Purple Top)SARS-COV-2 Vaccination 01/23/2019, 02/10/2019, 11/17/2019   PNEUMOCOCCAL CONJUGATE-20 10/27/2021   Pfizer Covid-19 Vaccine Bivalent Booster 29yrs & up 11/22/2020   Pfizer(Comirnaty)Fall Seasonal Vaccine 12 years and older 11/17/2021, 11/06/2022   Respiratory Syncytial Virus Vaccine,Recomb Aduvanted(Arexvy) 11/17/2021   Td 05/27/2015   Tdap 05/26/2005   Zoster Recombinant(Shingrix) 01/23/2017, 04/07/2017    Screening Tests Health Maintenance  Topic Date Due   COVID-19 Vaccine (7 - 2024-25 season) 01/01/2023   Medicare Annual Wellness (AWV)  03/24/2023   MAMMOGRAM  07/24/2023   DTaP/Tdap/Td (3 - Td or Tdap) 05/26/2025   Colonoscopy  01/10/2033   Pneumonia Vaccine 12+ Years old  Completed   INFLUENZA VACCINE  Completed   DEXA SCAN  Completed   Hepatitis C Screening  Completed   Zoster Vaccines- Shingrix  Completed   HPV VACCINES  Aged Out    Health Maintenance  Health Maintenance Due  Topic Date Due   COVID-19 Vaccine (7 - 2024-25 season) 01/01/2023   Medicare Annual Wellness (AWV)  03/24/2023   Health Maintenance Items Addressed: See Nurse Notes  Additional Screening:  Vision Screening: Recommended annual ophthalmology exams for early detection of glaucoma and other disorders of the eye. Patient is up to date [facility]  Dental Screening: Recommended annual dental exams for proper oral hygiene  Community Resource Referral / Chronic Care Management: CRR required this visit?  No   CCM required this visit?  No     Plan:     I have personally reviewed and noted the following in the patient's chart:   Medical  and social history Use of alcohol, tobacco or illicit drugs  Current medications and supplements including opioid prescriptions. Patient is not currently taking opioid prescriptions. Functional ability and status Nutritional status Physical activity Advanced directives List of other physicians Hospitalizations, surgeries, and ER visits in previous 12 months Vitals Screenings to include cognitive, depression, and falls Referrals and appointments  In addition, I have reviewed and discussed with patient certain preventive protocols, quality metrics, and best practice recommendations. A written personalized care plan for preventive services as well as general preventive health recommendations were provided to patient.     Sydell Axon, LPN   1/61/0960   After Visit Summary: (MyChart) Due to this being a telephonic visit, the after visit summary with patients personalized plan was offered to patient via MyChart   Notes: Nothing significant to report at this time.

## 2023-04-05 ENCOUNTER — Other Ambulatory Visit: Payer: Self-pay

## 2023-04-05 MED FILL — Atorvastatin Calcium Tab 40 MG (Base Equivalent): ORAL | 90 days supply | Qty: 90 | Fill #1 | Status: AC

## 2023-04-05 MED FILL — Metoprolol Tartrate Tab 25 MG: ORAL | 90 days supply | Qty: 180 | Fill #1 | Status: AC

## 2023-04-06 DIAGNOSIS — H524 Presbyopia: Secondary | ICD-10-CM | POA: Diagnosis not present

## 2023-04-14 ENCOUNTER — Ambulatory Visit: Payer: Medicare HMO | Admitting: Dermatology

## 2023-04-25 IMAGING — US US BREAST*L* LIMITED INC AXILLA
1 series · 2 of 2 positions shown · non-contrast
Comparison: Previous exam(s).

ACR Breast Density Category a: The breast tissue is almost entirely
fatty.

CLINICAL DATA: 66-year-old female presenting for evaluation of
focal tenderness in the upper inner left breast for 1 month.

EXAM:
DIGITAL DIAGNOSTIC BILATERAL MAMMOGRAM WITH TOMOSYNTHESIS AND CAD;
ULTRASOUND LEFT BREAST LIMITED
TECHNIQUE: Bilateral digital diagnostic mammography and breast tomosynthesis
was performed. The images were evaluated with computer-aided
detection.; Targeted ultrasound examination of the left breast was
performed.

[Series 1: us breast*left* limited inc axilla · 0.08mm/px · 2 of 2 slices shown]
[im 1/2]
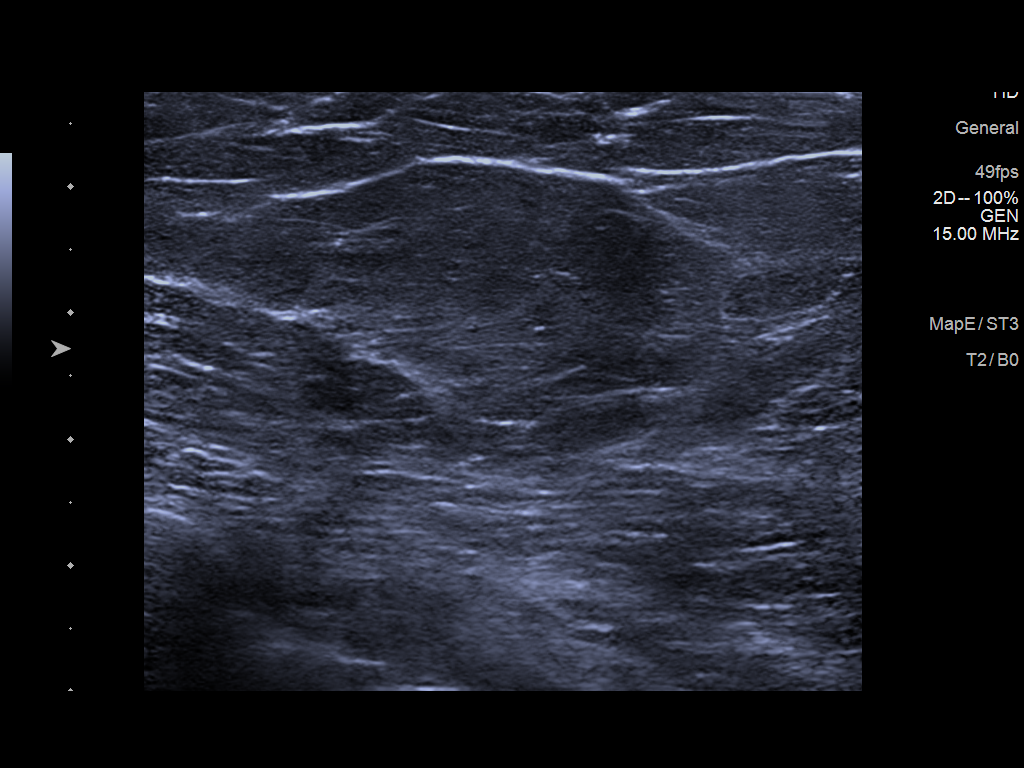
[im 2/2]
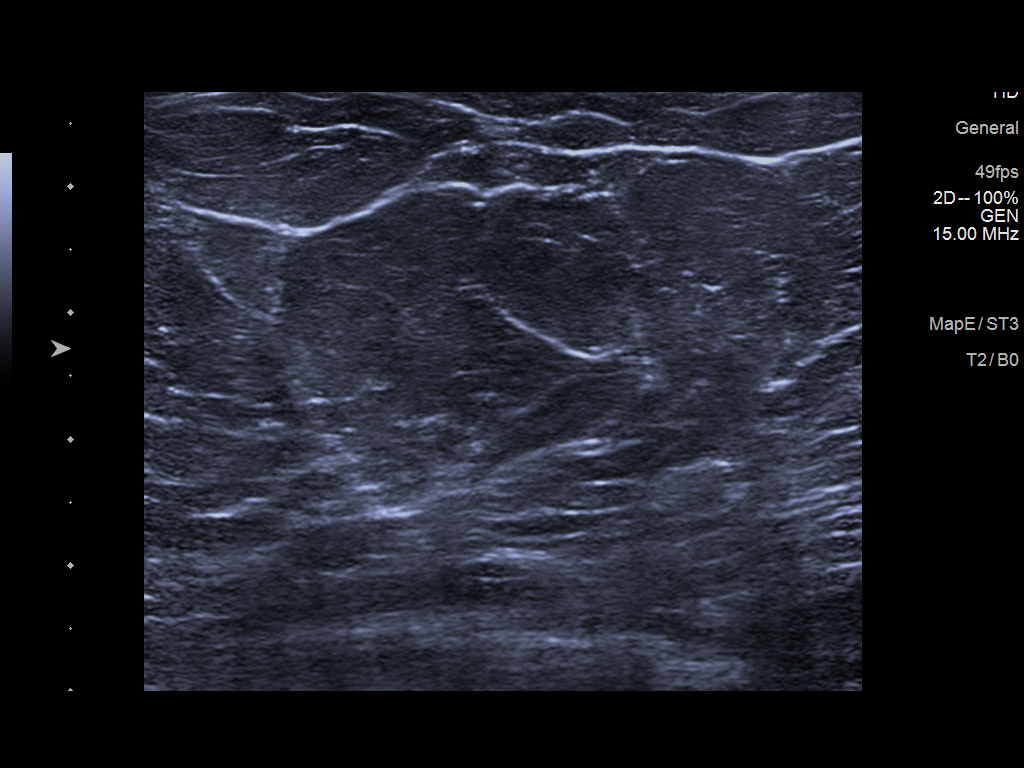

[2 of 2 positions shown; findings below may reference images not displayed]

FINDINGS: A BB indicating the tender site of concern has been placed along the
upper inner quadrant of the left breast. No suspicious mammographic
findings are identified deep to this marker. No suspicious
calcifications, masses or areas of distortion are seen in the
bilateral breasts.

Ultrasound targeted to the upper inner left breast demonstrates
normal fibroglandular tissue. No suspicious masses or areas of
shadowing are identified.
IMPRESSION: 1. There are no suspicious mammographic or targeted sonographic
abnormalities at the palpable site of concern in the upper inner
left breast.

2.  No mammographic evidence of malignancy in the bilateral breasts.

RECOMMENDATION:
1. Clinical follow-up recommended for the tender area of concern in
the left breast. Any further workup should be based on clinical
grounds.

2.  Screening mammogram in one year.(Code:KK-7-NRA)

I have discussed the findings and recommendations with the patient.
If applicable, a reminder letter will be sent to the patient
regarding the next appointment.

BI-RADS CATEGORY  1: Negative.

## 2023-04-25 IMAGING — MG DIGITAL DIAGNOSTIC BILAT W/ TOMO W/ CAD
6 of 12 series · 6 of 36 positions shown · non-contrast
Comparison: Previous exam(s).

ACR Breast Density Category a: The breast tissue is almost entirely
fatty.

CLINICAL DATA: 66-year-old female presenting for evaluation of
focal tenderness in the upper inner left breast for 1 month.

EXAM:
DIGITAL DIAGNOSTIC BILATERAL MAMMOGRAM WITH TOMOSYNTHESIS AND CAD;
ULTRASOUND LEFT BREAST LIMITED
TECHNIQUE: Bilateral digital diagnostic mammography and breast tomosynthesis
was performed. The images were evaluated with computer-aided
detection.; Targeted ultrasound examination of the left breast was
performed.

[L CC synth-2D]
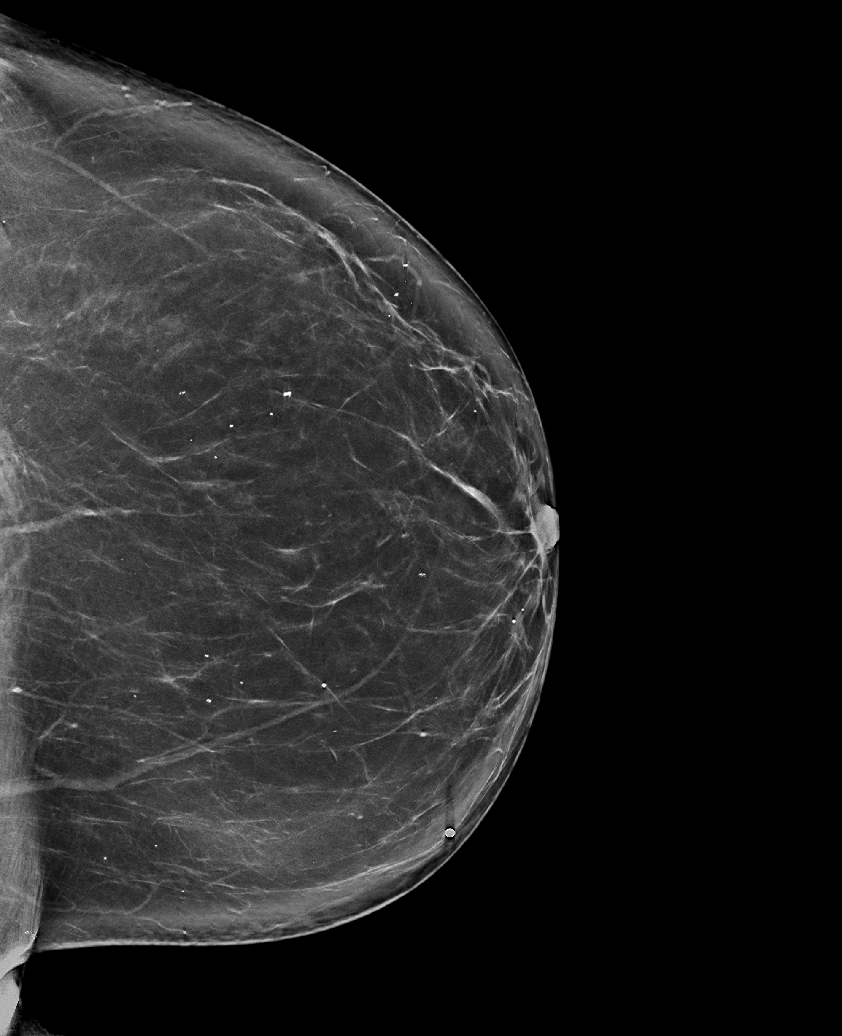

[R MLO synth-2D (1 of 2)]
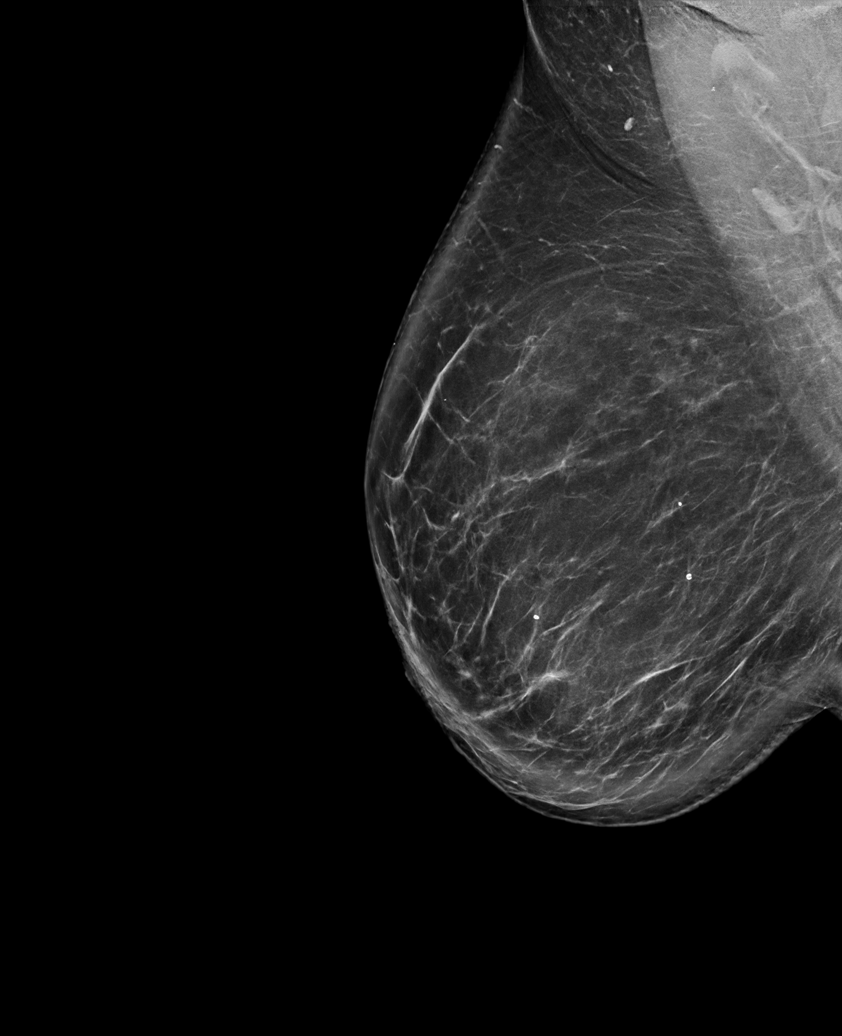

[R CC synth-2D]
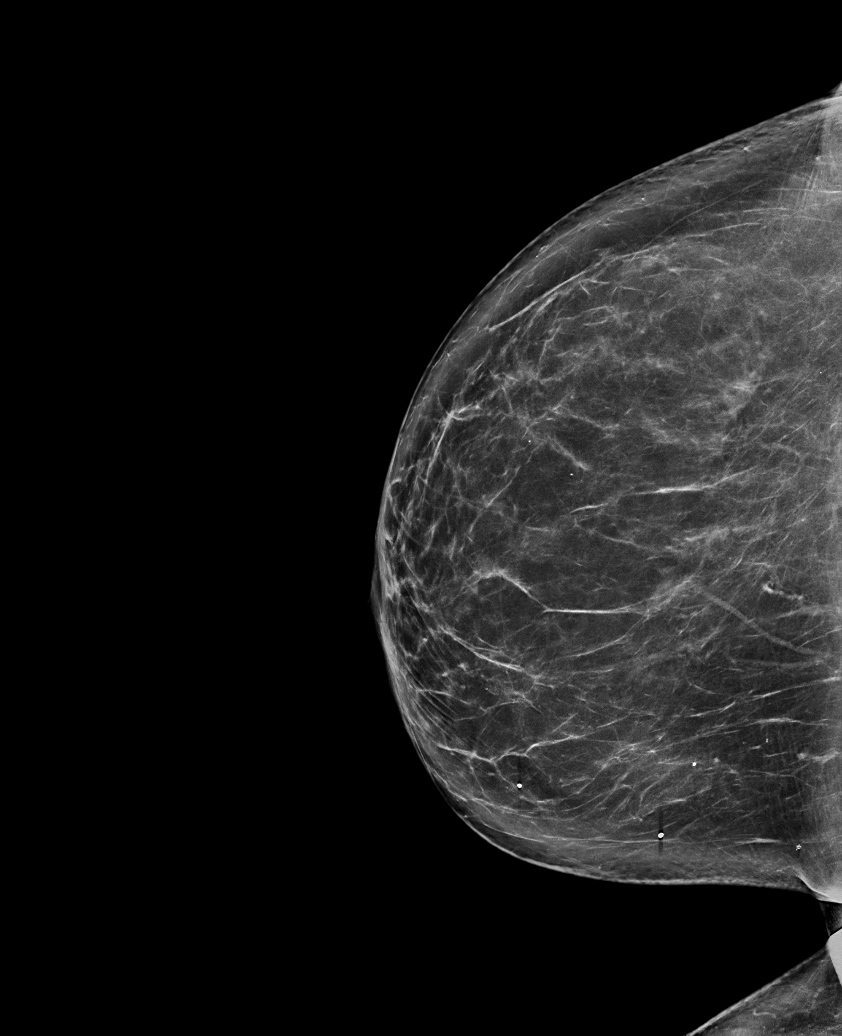

[R MLO synth-2D (2 of 2)]
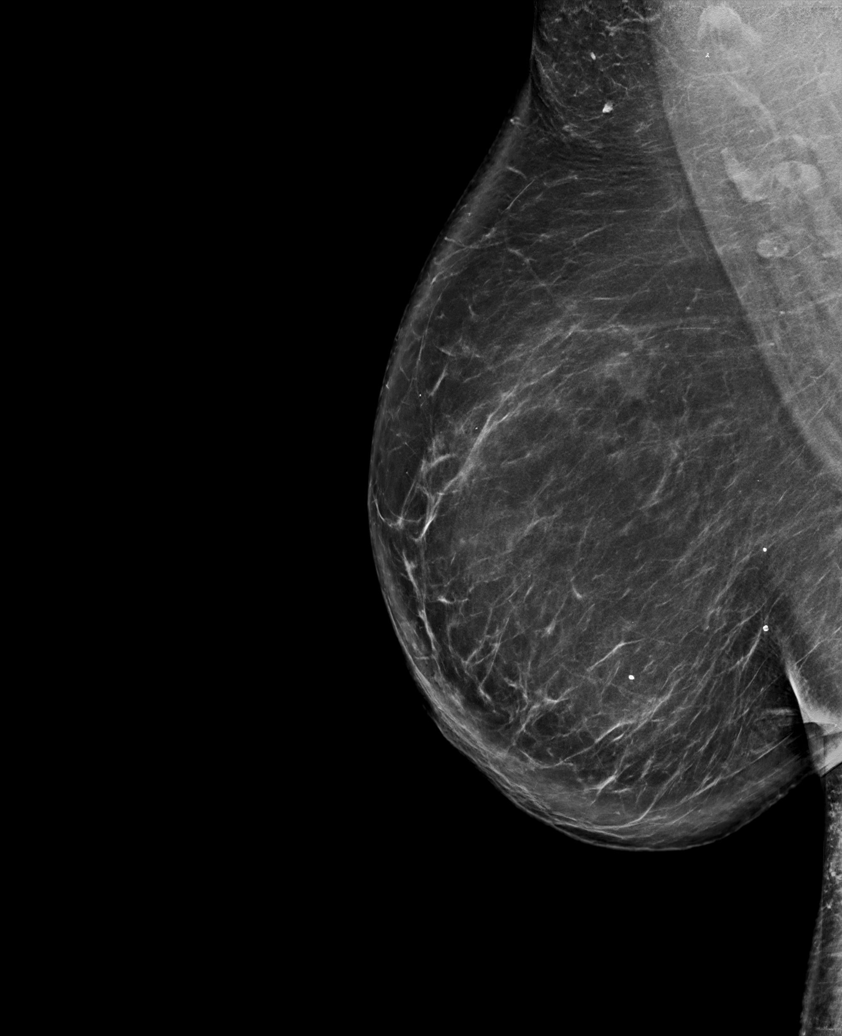

[L TAN synth-2D]
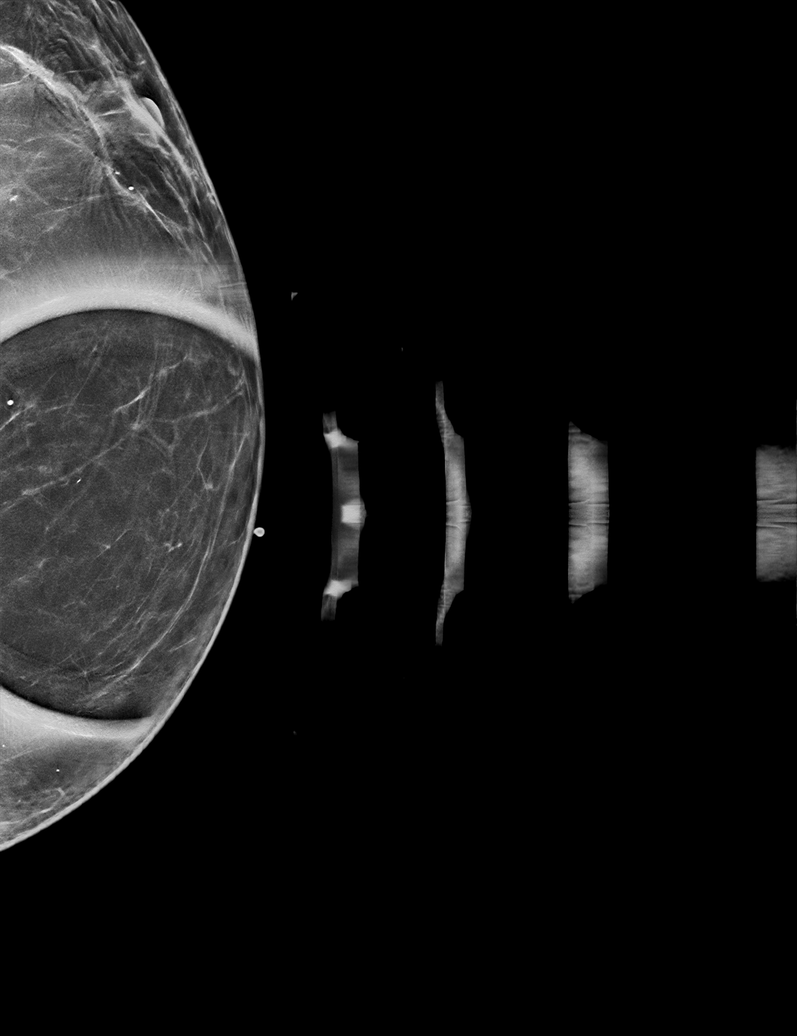

[L MLO synth-2D]
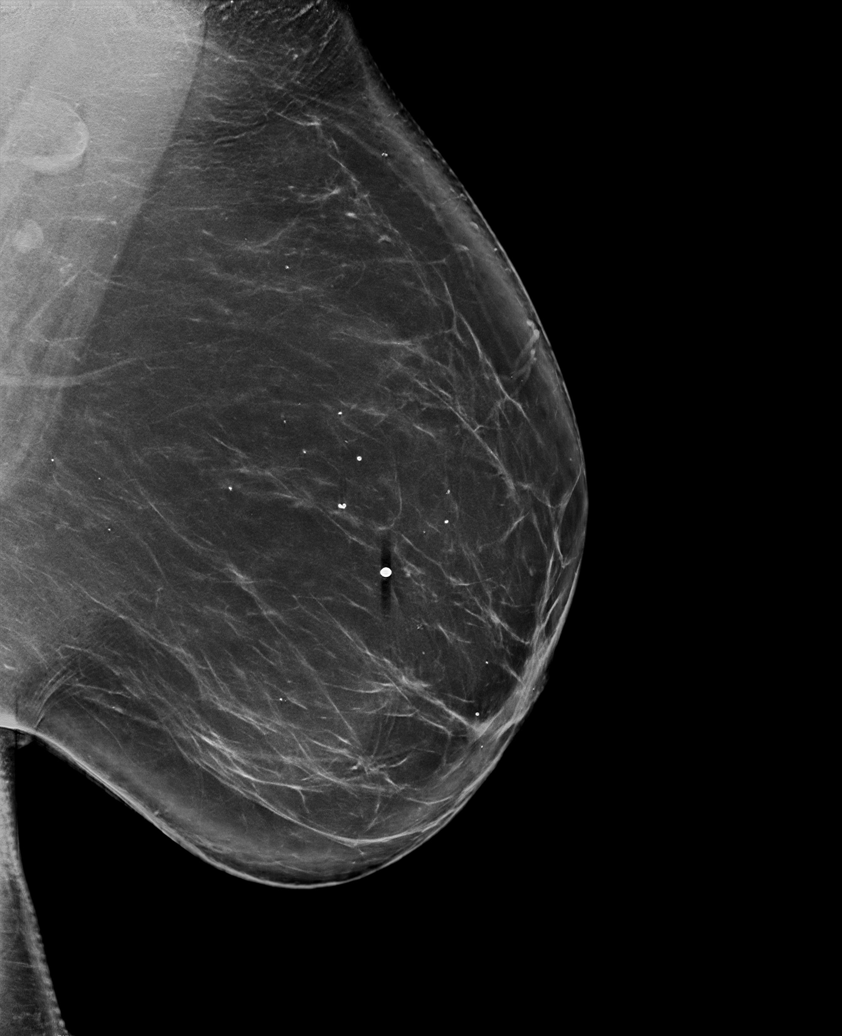

[6 of 36 positions shown; findings below may reference images not displayed]

FINDINGS: A BB indicating the tender site of concern has been placed along the
upper inner quadrant of the left breast. No suspicious mammographic
findings are identified deep to this marker. No suspicious
calcifications, masses or areas of distortion are seen in the
bilateral breasts.

Ultrasound targeted to the upper inner left breast demonstrates
normal fibroglandular tissue. No suspicious masses or areas of
shadowing are identified.
IMPRESSION: 1. There are no suspicious mammographic or targeted sonographic
abnormalities at the palpable site of concern in the upper inner
left breast.

2.  No mammographic evidence of malignancy in the bilateral breasts.

RECOMMENDATION:
1. Clinical follow-up recommended for the tender area of concern in
the left breast. Any further workup should be based on clinical
grounds.

2.  Screening mammogram in one year.(Code:KK-7-NRA)

I have discussed the findings and recommendations with the patient.
If applicable, a reminder letter will be sent to the patient
regarding the next appointment.

BI-RADS CATEGORY  1: Negative.

## 2023-05-02 ENCOUNTER — Encounter: Payer: Self-pay | Admitting: Internal Medicine

## 2023-05-04 ENCOUNTER — Ambulatory Visit (INDEPENDENT_AMBULATORY_CARE_PROVIDER_SITE_OTHER): Admitting: Internal Medicine

## 2023-05-04 VITALS — BP 120/68 | HR 78 | Temp 98.0°F | Resp 16 | Ht 61.0 in | Wt 176.4 lb

## 2023-05-04 DIAGNOSIS — N644 Mastodynia: Secondary | ICD-10-CM | POA: Diagnosis not present

## 2023-05-04 DIAGNOSIS — I10 Essential (primary) hypertension: Secondary | ICD-10-CM | POA: Diagnosis not present

## 2023-05-04 NOTE — Progress Notes (Signed)
 Subjective:    Patient ID: Marie Colon, female    DOB: [birth year], 68 y.o.   MRN: [id number]  Patient here for  Chief Complaint  Patient presents with   BREAST TENDERNESS    HPI Work in appt - work in for breast tenderness. She had previous diagnostic mammogram 07/24/22 - Birads I. Reports starting last week - noticed some increased tenderness. Noticed in the shower. Persistent. No right breast tenderness. Wanted to be evaluated given persistence.    Past Medical History:  Diagnosis Date   Acromegaly (HCC)    Actinic keratosis    Angiomyolipoma of kidney    evaluated by Dr Cherylene Corrente   Basal cell carcinoma 10/01/2022   L forehead, EDC 10/20/22   Fatty liver    GERD (gastroesophageal reflux disease)    Heart murmur    per Dr. Dellar Fenton   History of pyelonephritis    History of ulcerative colitis    Hx of dysplastic nevus 04/21/2016   L upper back, moderate to severe atypia   Hypercholesterolemia    Hypertension    Hypothyroidism    Pituitary macroadenoma (HCC)    s/p removal (elevated growth hormone)   squamous cell carcinoma of skin 07/20/2022   mid frontal scalp, MOHs 08/26/22   Tubal pregnancy    s/p rupture   Wears glasses    Past Surgical History:  Procedure Laterality Date   ABDOMINAL HYSTERECTOMY  2003   abnormal uterine bleeding   APPENDECTOMY     cancer lesion removed from head  05/2022   CHOLECYSTECTOMY  2005   COLONOSCOPY WITH PROPOFOL N/A 03/18/2017   Procedure: COLONOSCOPY WITH PROPOFOL;  Surgeon: Deveron Fly, MD;  Location[facility] ENDOSCOPY;  Service: Endoscopy;  Laterality: N/A;   DILATION AND CURETTAGE OF UTERUS     MASS EXCISION Right 08/29/2015   Procedure: EXCISION OF RIGHT THIGH MASS SUBFASICAL 15 CM;  Surgeon: Lockie Rima, MD;  Location: [facility] OR;  Service: General;  Laterality: Right;   pituitary tumor removal  2006   Family History  Problem Relation Age of Onset   Brain cancer Father        died - 11   Diabetes Mother     Hypothyroidism Sister    Breast cancer Sister 31   Sjogren's syndrome Sister    Breast cancer Sister 54   Diabetes Other        grandmother   Social History   Socioeconomic History   Marital status: Married    Spouse name: Not on file   Number of children: 2   Years of education: Not on file   Highest education level: 12th grade  Occupational History    Employer: [employer]    Comment: [employer] - [employer]  Tobacco Use   Smoking status: Never   Smokeless tobacco: Never  Vaping Use   Vaping status: Never Used  Substance and Sexual Activity   Alcohol use: No    Alcohol/week: 0.0 standard drinks of alcohol   Drug use: No   Sexual activity: Not on file  Other Topics Concern   Not on file  Social History Narrative   Married   Social Drivers of Health   Financial Resource Strain: Low Risk  (05/04/2023)   Overall Financial Resource Strain (CARDIA)    Difficulty of Paying Living Expenses: Not hard at all  Food Insecurity: No Food Insecurity (05/04/2023)   Hunger Vital Sign    Worried About Running Out of Food in the Last Year: Never  true    Ran Out of Food in the Last Year: Never true  Transportation Needs: No Transportation Needs (05/04/2023)   PRAPARE - Administrator, Civil Service (Medical): No    Lack of Transportation (Non-Medical): No  Physical Activity: Insufficiently Active (05/04/2023)   Exercise Vital Sign    Days of Exercise per Week: 2 days    Minutes of Exercise per Session: 20 min  Stress: No Stress Concern Present (05/04/2023)   [employer] of Occupational Health - Occupational Stress Questionnaire    Feeling of Stress : Not at all  Social Connections: Socially Integrated (05/04/2023)   Social Connection and Isolation Panel [NHANES]    Frequency of Communication with Friends and Family: More than three times a week    Frequency of Social Gatherings with Friends and Family: Three times a week    Attends Religious Services: More than 4 times per year    Active  Member of Clubs or Organizations: Yes    Attends Banker Meetings: More than 4 times per year    Marital Status: Married     Review of Systems  Constitutional:  Negative for appetite change and unexpected weight change.  HENT:  Negative for congestion and sinus pressure.   Respiratory:  Negative for cough, chest tightness and shortness of breath.   Cardiovascular:  Negative for chest pain, palpitations and leg swelling.  Gastrointestinal:  Negative for abdominal pain, diarrhea, nausea and vomiting.  Genitourinary:  Negative for difficulty urinating and dysuria.  Musculoskeletal:  Negative for joint swelling and myalgias.  Skin:  Negative for color change and rash.  Neurological:  Negative for dizziness and headaches.  Psychiatric/Behavioral:  Negative for agitation and dysphoric mood.        Objective:     BP 120/68   Pulse 78   Temp 98 F (36.7 C)   Resp 16   Ht 5\' 1"  (1.549 m)   Wt 176 lb 6.4 oz (80 kg)   SpO2 98%   BMI 33.33 kg/m  Wt Readings from Last 3 Encounters:  05/04/23 176 lb 6.4 oz (80 kg)  03/24/23 172 lb (78 kg)  03/04/23 175 lb 6.4 oz (79.6 kg)    Physical Exam Vitals reviewed.  Constitutional:      General: She is not in acute distress.    Appearance: Normal appearance.  HENT:     Head: Normocephalic and atraumatic.     Right Ear: External ear normal.     Left Ear: External ear normal.  Eyes:     General: No scleral icterus.       Right eye: No discharge.        Left eye: No discharge.     Conjunctiva/sclera: Conjunctivae normal.  Neck:     Thyroid: No thyromegaly.  Cardiovascular:     Rate and Rhythm: Normal rate and regular rhythm.  Pulmonary:     Effort: No respiratory distress.     Breath sounds: Normal breath sounds. No wheezing.     Comments: Breasts:  left breast - no nipple discharge or nipple retraction present. Increased tenderness and thickening - 9:00 left breast. No axillary adenopathy. Right breast - no nipple  discharge or nipple retraction present. Could not appreciate distinct nodule or axillary adenopathy to be present. No tenderness.  Abdominal:     General: Bowel sounds are normal.     Palpations: Abdomen is soft.     Tenderness: There is no abdominal tenderness.  Musculoskeletal:  General: No swelling or tenderness.     Cervical back: Neck supple. No tenderness.  Lymphadenopathy:     Cervical: No cervical adenopathy.  Skin:    Findings: No erythema or rash.  Neurological:     Mental Status: She is alert.  Psychiatric:        Mood and Affect: Mood normal.        Behavior: Behavior normal.         Outpatient Encounter Medications as of 05/04/2023  Medication Sig   allopurinol (ZYLOPRIM) 100 MG tablet Take 1 tablet (100 mg total) by mouth daily.   aspirin EC 81 MG tablet Take 81 mg by mouth daily.   atorvastatin (LIPITOR) 40 MG tablet Take 1 tablet (40 mg total) by mouth daily.   cabergoline (DOSTINEX) 0.5 MG tablet Take 0.5 tablets (0.25 mg total) by mouth daily.   cabergoline (DOSTINEX) 0.5 MG tablet Take 0.5 tablets (0.25 mg total) by mouth daily.   lansoprazole (PREVACID) 30 MG capsule Take 1 capsule (30 mg total) by mouth daily at 12:00 noon.   levothyroxine (SYNTHROID) 100 MCG tablet Take 1 tablet (100 mcg total) by mouth daily before breakfast.   metoprolol tartrate (LOPRESSOR) 25 MG tablet Take 1 tablet (25 mg total) by mouth 2 (two) times daily   Multiple Vitamin (MULTIVITAMIN) tablet Take 1 tablet by mouth daily.   No facility-administered encounter medications on file as of 05/04/2023.     Lab Results  Component Value Date   WBC 8.1 07/07/2022   HGB 13.5 07/07/2022   HCT 40.3 07/07/2022   PLT 199.0 07/07/2022   GLUCOSE 109 (H) 11/06/2022   CHOL 157 11/06/2022   TRIG 292.0 (H) 11/06/2022   HDL 36.90 (L) 11/06/2022   LDLDIRECT 89.0 07/07/2022   LDLCALC 61 11/06/2022   ALT 39 (H) 11/06/2022   AST 28 11/06/2022   NA 142 11/06/2022   K 3.9 11/06/2022   CL  105 11/06/2022   CREATININE 0.96 11/06/2022   BUN 11 11/06/2022   CO2 29 11/06/2022   TSH 2.04 11/06/2022   HGBA1C 6.1 11/06/2022    US  Abdomen Limited RUQ (LIVER/GB) Result Date: 12/14/2022 CLINICAL DATA:  Fatty liver EXAM: ULTRASOUND ABDOMEN LIMITED RIGHT UPPER QUADRANT COMPARISON:  Renal ultrasound 07/28/2021 FINDINGS: Gallbladder: Surgically absent Common bile duct: Diameter: 4 mm Liver: Increased echogenicity. No focal lesion. Portal vein is patent on color Doppler imaging with normal direction of blood flow towards the liver. Other: Similar-appearing 1 cm echogenic mass inferior pole right kidney, potentially angiomyolipoma. IMPRESSION: 1. Increased hepatic parenchymal echogenicity suggestive of steatosis. 2. Similar-appearing 1 cm echogenic mass inferior pole right kidney, potentially angiomyolipoma. Electronically Signed   By: Jone Neither M.D.   On: 12/14/2022 12:34       Assessment & Plan:  Breast tenderness Assessment & Plan: Exam as outlined. Had mammogram in 06/2023 as outlined - Birads I. Increased tenderness noted recently. Will check diagnostic mammogram with possible ultrasound - to better evaluated.   Orders: -     US  LIMITED ULTRASOUND INCLUDING AXILLA LEFT BREAST ; Future  Primary hypertension Assessment & Plan: Continue metoprolol and triam/hctz.  Blood pressure as outlined. . Follow metabolic panel. Follow pressures.       Dellar Fenton, MD

## 2023-05-05 ENCOUNTER — Other Ambulatory Visit: Payer: Self-pay

## 2023-05-06 ENCOUNTER — Ambulatory Visit
Admission: RE | Admit: 2023-05-06 | Discharge: 2023-05-06 | Disposition: A | Discharge status: Home / Self Care | Source: Ambulatory Visit | Attending: Internal Medicine | Admitting: Internal Medicine

## 2023-05-06 ENCOUNTER — Other Ambulatory Visit: Payer: Self-pay | Admitting: Internal Medicine

## 2023-05-06 DIAGNOSIS — N644 Mastodynia: Secondary | ICD-10-CM | POA: Insufficient documentation

## 2023-05-06 DIAGNOSIS — R92322 Mammographic fibroglandular density, left breast: Secondary | ICD-10-CM | POA: Diagnosis not present

## 2023-05-06 DIAGNOSIS — N6320 Unspecified lump in the left breast, unspecified quadrant: Secondary | ICD-10-CM | POA: Diagnosis not present

## 2023-05-09 ENCOUNTER — Encounter: Payer: Self-pay | Admitting: Internal Medicine

## 2023-05-09 NOTE — Assessment & Plan Note (Signed)
 Exam as outlined. Had mammogram in 06/2023 as outlined - Birads I. Increased tenderness noted recently. Will check diagnostic mammogram with possible ultrasound - to better evaluated.

## 2023-05-09 NOTE — Assessment & Plan Note (Signed)
 Continue metoprolol and triam/hctz.  Blood pressure as outlined. . Follow metabolic panel. Follow pressures.

## 2023-05-10 ENCOUNTER — Ambulatory Visit: Admitting: Dermatology

## 2023-05-10 ENCOUNTER — Encounter: Payer: Self-pay | Admitting: Dermatology

## 2023-05-10 DIAGNOSIS — W908XXA Exposure to other nonionizing radiation, initial encounter: Secondary | ICD-10-CM

## 2023-05-10 DIAGNOSIS — Z8589 Personal history of malignant neoplasm of other organs and systems: Secondary | ICD-10-CM

## 2023-05-10 DIAGNOSIS — Z79899 Other long term (current) drug therapy: Secondary | ICD-10-CM

## 2023-05-10 DIAGNOSIS — Z85828 Personal history of other malignant neoplasm of skin: Secondary | ICD-10-CM

## 2023-05-10 DIAGNOSIS — L578 Other skin changes due to chronic exposure to nonionizing radiation: Secondary | ICD-10-CM

## 2023-05-10 DIAGNOSIS — L649 Androgenic alopecia, unspecified: Secondary | ICD-10-CM

## 2023-05-10 DIAGNOSIS — L570 Actinic keratosis: Secondary | ICD-10-CM

## 2023-05-10 DIAGNOSIS — Z5111 Encounter for antineoplastic chemotherapy: Secondary | ICD-10-CM

## 2023-05-10 DIAGNOSIS — Z7189 Other specified counseling: Secondary | ICD-10-CM

## 2023-05-10 MED ORDER — FLUOROURACIL 5 % EX CREA: TOPICAL_CREAM | CUTANEOUS | 2 refills | Status: DC

## 2023-05-10 NOTE — Progress Notes (Signed)
 Follow-Up Visit   Subjective  Marie Colon is a 68 y.o. female who presents for the following: 6 months f/u Recheck her forehead/scalp hx of SCC treated with Mohs surgery 08/26/2022.  The patient has spots, moles and lesions to be evaluated, some may be new or changing and the patient may have concern these could be cancer.  The following portions of the chart were reviewed this encounter and updated as appropriate: medications, allergies, medical history  Review of Systems:  No other skin or systemic complaints except as noted in HPI or Assessment and Plan.  Objective  Well appearing patient in no apparent distress; mood and affect are within normal limits.  A focused examination was performed of the following areas:face,scalp  Relevant exam findings are noted in the Assessment and Plan.  left superior forehead near hairline x 1, midline mid scalp vertex x1 , mid frontal scalp x 5 (7) Erythematous thin papules/macules with gritty scale.   Assessment & Plan   HISTORY OF SQUAMOUS CELL CARCINOMA OF THE SKIN Mid frontal scalp  - No evidence of recurrence today - No lymphadenopathy - Recommend regular full body skin exams - Recommend daily broad spectrum sunscreen SPF 30+ to sun-exposed areas, reapply every 2 hours as needed.  - Call if any new or changing lesions are noted between office visits  AK (ACTINIC KERATOSIS) (7) left superior forehead near hairline x 1, midline mid scalp vertex x1 , mid frontal scalp x 5 (7) ACTINIC DAMAGE WITH PRECANCEROUS ACTINIC KERATOSES Counseling for Topical Chemotherapy Management: Patient exhibits: - Severe, confluent actinic changes with pre-cancerous actinic keratoses that is secondary to cumulative UV radiation exposure over time - Condition that is severe; chronic, not at goal. - diffuse scaly erythematous macules and papules with underlying dyspigmentation - Discussed Prescription "Field Treatment" topical Chemotherapy for Severe,  Chronic Confluent Actinic Changes with Pre-Cancerous Actinic Keratoses Field treatment involves treatment of an entire area of skin that has confluent Actinic Changes (Sun/ Ultraviolet light damage) and PreCancerous Actinic Keratoses by method of PhotoDynamic Therapy (PDT) and/or prescription Topical Chemotherapy agents such as 5-fluorouracil, 5-fluorouracil/calcipotriene, and/or imiquimod.  The purpose is to decrease the number of clinically evident and subclinical PreCancerous lesions to prevent progression to development of skin cancer by chemically destroying early precancer changes that may or may not be visible.  It has been shown to reduce the risk of developing skin cancer in the treated area. As a result of treatment, redness, scaling, crusting, and open sores may occur during treatment course. One or more than one of these methods may be used and may have to be used several times to control, suppress and eliminate the PreCancerous changes. Discussed treatment course, expected reaction, and possible side effects. - Recommend daily broad spectrum sunscreen SPF 30+ to sun-exposed areas, reapply every 2 hours as needed.  - Staying in the shade or wearing long sleeves, sun glasses (UVA+UVB protection) and wide brim hats (4-inch brim around the entire circumference of the hat) are also recommended. - Call for new or changing lesions.  Destruction of lesion - left superior forehead near hairline x 1, midline mid scalp vertex x1 , mid frontal scalp x 5 (7) Complexity: simple   Destruction method: cryotherapy   Informed consent: discussed and consent obtained   Timeout:  patient name, date of birth, surgical site, and procedure verified Lesion destroyed using liquid nitrogen: Yes   Region frozen until ice ball extended beyond lesion: Yes   Outcome: patient tolerated procedure well with  no complications   Post-procedure details: wound care instructions given   June 28 2023 start 5FU/Calcipotriene cream  apply to left superior forehead near hairline, midline mid scalp vertex, mid frontal scalp   ANDROGENETIC ALOPECIA (FEMALE PATTERN HAIR LOSS) Exam: Diffuse thinning of the crown and widening of the midline part with retention of the frontal hairline  Female Androgenic Alopecia is a chronic condition related to genetics and/or hormonal changes.  In women androgenetic alopecia is commonly associated with menopause but may occur any time after puberty.  It causes hair thinning primarily on the crown with widening of the part and temporal hairline recession.  Can use OTC Rogaine (minoxidil) 5% solution/foam as directed.  Oral treatments in female patients who have no contraindication may include : - Low dose oral minoxidil 1.25 - 5mg  daily - Spironolactone 50 - 100mg  bid - Finasteride 2.5 - 5 mg daily Adjunctive therapies include: - Low Level Laser Light Therapy (LLLT) - Platelet-rich plasma injections (PRP) - Hair Transplants or scalp reduction   Treatment Plan: Will discuss Minoxidil tablets at her follow up visit in August   Long term medication management.  Patient is using long term (months to years) prescription medication  to control their dermatologic condition.  These medications require periodic monitoring to evaluate for efficacy and side effects and may require periodic laboratory monitoring.     Return in about 4 months (around 09/09/2023) for TBSE, hx of SCC, hx of AKs .  IClara Crisp, CMA, am acting as scribe for Celine Collard, MD .   Documentation: I have reviewed the above documentation for accuracy and completeness, and I agree with the above.  Celine Collard, MD

## 2023-05-10 NOTE — Patient Instructions (Addendum)
 June 28 2023 start 5FU/Calcipotriene cream apply to left superior forehead near hairline, midline mid scalp vertex, mid frontal scalp twice a day x 14 days      5-Fluorouracil/Calcipotriene Patient Education   Actinic keratoses are the dry, red scaly spots on the skin caused by sun damage. A portion of these spots can turn into skin cancer with time, and treating them can help prevent development of skin cancer.   Treatment of these spots requires removal of the defective skin cells. There are various ways to remove actinic keratoses, including freezing with liquid nitrogen, treatment with creams, or treatment with a blue light procedure in the office.   5-fluorouracil cream is a topical cream used to treat actinic keratoses. It works by interfering with the growth of abnormal fast-growing skin cells, such as actinic keratoses. These cells peel off and are replaced by healthy ones.   5-fluorouracil/calcipotriene is a combination of the 5-fluorouracil cream with a vitamin D analog cream called calcipotriene. The calcipotriene alone does not treat actinic keratoses. However, when it is combined with 5-fluorouracil, it helps the 5-fluorouracil treat the actinic keratoses much faster so that the same results can be achieved with a much shorter treatment time.  INSTRUCTIONS FOR 5-FLUOROURACIL/CALCIPOTRIENE CREAM:   5-fluorouracil/calcipotriene cream typically only needs to be used for 7 days. A thin layer should be applied twice a day to the treatment areas recommended by your physician.   If your physician prescribed you separate tubes of 5-fluourouracil and calcipotriene, apply a thin layer of 5-fluorouracil followed by a thin layer of calcipotriene.   Avoid contact with your eyes, nostrils, and mouth. Do not use 5-fluorouracil/calcipotriene cream on infected or open wounds.   You will develop redness, irritation and some crusting at areas where you have pre-cancer damage/actinic keratoses. IF YOU  DEVELOP PAIN, BLEEDING, OR SIGNIFICANT CRUSTING, STOP THE TREATMENT EARLY - you have already gotten a good response and the actinic keratoses should clear up well.  Wash your hands after applying 5-fluorouracil 5% cream on your skin.   A moisturizer or sunscreen with a minimum SPF 30 should be applied each morning.   Once you have finished the treatment, you can apply a thin layer of Vaseline twice a day to irritated areas to soothe and calm the areas more quickly. If you experience significant discomfort, contact your physician.  For some patients it is necessary to repeat the treatment for best results.  SIDE EFFECTS: When using 5-fluorouracil/calcipotriene cream, you may have mild irritation, such as redness, dryness, swelling, or a mild burning sensation. This usually resolves within 2 weeks. The more actinic keratoses you have, the more redness and inflammation you can expect during treatment. Eye irritation has been reported rarely. If this occurs, please let us know.  If you have any trouble using this cream, please call the office. If you have any other questions about this information, please do not hesitate to ask me before you leave the office.      Instructions for [facility] Medications  One or more of your medications was sent to the [facility] mail order compounding pharmacy. You will receive an email from them and can purchase the medicine through that link. It will then be mailed to your home at the address you confirmed. If for any reason you do not receive an email from them, please check your spam folder. If you still do not find the email, please let us know. [facility] phone number is [phone].  Cryotherapy Aftercare  Wash gently with soap and water everyday.   Apply Vaseline and Band-Aid daily until healed.     Due to recent changes in healthcare laws, you may see results of your pathology and/or laboratory studies on MyChart before the  doctors have had a chance to review them. We understand that in some cases there may be results that are confusing or concerning to you. Please understand that not all results are received at the same time and often the doctors may need to interpret multiple results in order to provide you with the best plan of care or course of treatment. Therefore, we ask that you please give [place] 2 business days to thoroughly review all your results before contacting the office for clarification. Should we see a critical lab result, you will be contacted sooner.   If You Need Anything After Your Visit  If you have any questions or concerns for your doctor, please call our main line at [phone] and press option 4 to reach your doctor's medical assistant. If no one answers, please leave a voicemail as directed and we will return your call as soon as possible. Messages left after 4 pm will be answered the following business day.   You may also send [place] a message via MyChart. We typically respond to MyChart messages within 1-2 business days.  For prescription refills, please ask your pharmacy to contact our office. Our fax number is [phone].  If you have an urgent issue when the clinic is closed that cannot wait until the next business day, you can page your doctor at the number below.    Please note that while we do our best to be available for urgent issues outside of office hours, we are not available 24/7.   If you have an urgent issue and are unable to reach [place], you may choose to seek medical care at your doctor's office, retail clinic, urgent care center, or emergency room.  If you have a medical emergency, please immediately call 911 or go to the emergency department.  Pager Numbers  - Dr. Gwen Pounds: [phone]  - Dr. Roseanne Reno: [phone]  - Dr. Katrinka Blazing: [phone]   In the event of inclement weather, please call our main line at [phone] for an update on the status of any delays or  closures.  Dermatology Medication Tips: Please keep the boxes that topical medications come in in order to help keep track of the instructions about where and how to use these. Pharmacies typically print the medication instructions only on the boxes and not directly on the medication tubes.   If your medication is too expensive, please contact our office at [phone] option 4 or send [place] a message through MyChart.   We are unable to tell what your co-pay for medications will be in advance as this is different depending on your insurance coverage. However, we may be able to find a substitute medication at lower cost or fill out paperwork to get insurance to cover a needed medication.   If a prior authorization is required to get your medication covered by your insurance company, please allow [place] 1-2 business days to complete this process.  Drug prices often vary depending on where the prescription is filled and some pharmacies may offer cheaper prices.  The website www.goodrx.com contains coupons for medications through different pharmacies. The prices here do not account for what the cost may be with help from insurance (it may be cheaper with your insurance), but the  website can give you the price if you did not use any insurance.  - You can print the associated coupon and take it with your prescription to the pharmacy.  - You may also stop by our office during regular business hours and pick up a GoodRx coupon card.  - If you need your prescription sent electronically to a different pharmacy, notify our office through [facility] or by phone at [phone] option 4.     Si Usted Necesita Algo Despus de Su Visita  Tambin puede enviarnos un mensaje a travs de Clinical cytogeneticist. Por lo general respondemos a los mensajes de MyChart en el transcurso de 1 a 2 das hbiles.  Para renovar recetas, por favor pida a su farmacia que se ponga en contacto con nuestra oficina. Franz Jacks de fax  es [place] [phone].  Si tiene un asunto urgente cuando la clnica est cerrada y que no puede esperar hasta el siguiente da hbil, puede llamar/localizar a su doctor(a) al nmero que aparece a continuacin.   Por favor, tenga en cuenta que aunque hacemos todo lo posible para estar disponibles para asuntos urgentes fuera del horario de [place], no estamos disponibles las 24 horas del da, los 7 [address]  [address] de la [place].   Si tiene un problema urgente y no puede comunicarse con nosotros, puede optar por buscar atencin mdica  en el consultorio de su doctor(a), en una clnica privada, en un centro de atencin urgente o en una sala de emergencias.  Si tiene Engineer, drilling, por favor llame inmediatamente al 911 o vaya a la sala de emergencias.  Nmeros de bper  - Dr. Bary Likes: [phone]  - Dra. Alyzza Barters: [phone]  - Dr. Felipe Horton: [phone]   En caso de inclemencias del tiempo, por favor llame a Lajuan Pila principal al [phone] para una actualizacin sobre el [place] de cualquier retraso o cierre.  Consejos para la medicacin en dermatologa: Por favor, guarde las cajas en las que vienen los medicamentos de uso tpico para ayudarle a seguir las instrucciones sobre dnde y cmo usarlos. Las farmacias generalmente imprimen las instrucciones del medicamento slo en las cajas y no directamente en los tubos del [place].   Si su medicamento es muy caro, por favor, pngase en contacto con Bettyjane Brunet llamando al [phone] y presione la opcin 4 o envenos un mensaje a travs de Clinical cytogeneticist.   No podemos decirle cul ser su copago por los medicamentos por adelantado ya que esto es diferente dependiendo de la cobertura de su seguro. Sin embargo, es posible que podamos encontrar un medicamento sustituto a Audiological scientist un formulario para que el seguro cubra el medicamento que se considera necesario.   Si se requiere una autorizacin previa para que su compaa de seguros  [place] su medicamento, por favor permtanos de 1 a 2 das hbiles para completar este proceso.  Los precios de los medicamentos varan con frecuencia dependiendo del Environmental consultant de dnde se surte la receta y alguna farmacias pueden ofrecer precios ms baratos.  El sitio web www.goodrx.com tiene cupones para medicamentos de Health and safety inspector. Los precios aqu no tienen en cuenta lo que podra costar con la ayuda del seguro (puede ser ms barato con su seguro), pero el sitio web puede darle el precio si no utiliz Tourist information centre manager.  - Puede imprimir el cupn correspondiente y llevarlo con su receta a la farmacia.  - Tambin puede pasar por nuestra oficina durante el horario de atencin regular y Education officer, museum una tarjeta de cupones de  GoodRx.  - Si necesita que su receta se enve electrnicamente a una farmacia diferente, informe a nuestra oficina a travs de MyChart de [place] o por telfono llamando al [phone] y presione la opcin 4.

## 2023-05-23 NOTE — Progress Notes (Signed)
 Subjective:    Patient ID: Marie Colon, female    DOB: [birth year], 68 y.o.   MRN: [id number]  Patient here for  Chief Complaint  Patient presents with   mammogram results    HPI Here to discuss recent mammogram results.  Had left breast mammogram 05/06/23 - [facility] I. Mammogram was done due to left breast pain. She had questions about mammogram and with family history recommended f/u to discuss her cancer risk. She reports a family history  breast cancer - two sisters and a maternal aunt. Unsure of genetic testing. She is interested in pursuing MRI testing for her breast. Discussed risk of breast cancer. Discussed GAIL model. Calculated 5 year risk of breast cancer 5.84 and lifetime risk 18.76. discussed results and discussed lifetime risk. Discussed referral for genetic counseling and genetic testing.    Past Medical History:  Diagnosis Date   Acromegaly (HCC)    Actinic keratosis    Angiomyolipoma of kidney    evaluated by Dr Cherylene Corrente   Basal cell carcinoma 10/01/2022   L forehead, EDC 10/20/22   Fatty liver    GERD (gastroesophageal reflux disease)    Heart murmur    per Dr. Dellar Fenton   History of pyelonephritis    History of ulcerative colitis    Hx of dysplastic nevus 04/21/2016   L upper back, moderate to severe atypia   Hypercholesterolemia    Hypertension    Hypothyroidism    Pituitary macroadenoma (HCC)    s/p removal (elevated growth hormone)   squamous cell carcinoma of skin 07/20/2022   mid frontal scalp, MOHs 08/26/22   Tubal pregnancy    s/p rupture   Wears glasses    Past Surgical History:  Procedure Laterality Date   ABDOMINAL HYSTERECTOMY  2003   abnormal uterine bleeding   APPENDECTOMY     cancer lesion removed from head  05/2022   CHOLECYSTECTOMY  2005   COLONOSCOPY WITH PROPOFOL  N/A 03/18/2017   Procedure: COLONOSCOPY WITH PROPOFOL ;  Surgeon: Deveron Fly, MD;  Location: [facility] ENDOSCOPY;  Service: Endoscopy;  Laterality: N/A;    DILATION AND CURETTAGE OF UTERUS     MASS EXCISION Right 08/29/2015   Procedure: EXCISION OF RIGHT THIGH MASS SUBFASICAL 15 CM;  Surgeon: Lockie Rima, MD;  Location: [facility] OR;  Service: General;  Laterality: Right;   pituitary tumor removal  2006   Family History  Problem Relation Age of Onset   Brain cancer Father        died - 76   Diabetes Mother    Hypothyroidism Sister    Breast cancer Sister 73   Sjogren's syndrome Sister    Breast cancer Sister 35   Diabetes Other        grandmother   Social History   Socioeconomic History   Marital status: Married    Spouse name: Not on file   Number of children: 2   Years of education: Not on file   Highest education level: 12th grade  Occupational History    Employer: [employer]    Comment: [employer] - [employer]  Tobacco Use   Smoking status: Never   Smokeless tobacco: Never  Vaping Use   Vaping status: Never Used  Substance and Sexual Activity   Alcohol use: No    Alcohol/week: 0.0 standard drinks of alcohol   Drug use: No   Sexual activity: Not on file  Other Topics Concern   Not on file  Social History Narrative   Married  Social Drivers of Corporate investment banker Strain: Low Risk  (05/04/2023)   Overall Financial Resource Strain (CARDIA)    Difficulty of Paying Living Expenses: Not hard at all  Food Insecurity: No Food Insecurity (05/04/2023)   Hunger Vital Sign    Worried About Running Out of Food in the Last Year: Never true    Ran Out of Food in the Last Year: Never true  Transportation Needs: No Transportation Needs (05/04/2023)   PRAPARE - Administrator, Civil Service (Medical): No    Lack of Transportation (Non-Medical): No  Physical Activity: Insufficiently Active (05/04/2023)   Exercise Vital Sign    Days of Exercise per Week: 2 days    Minutes of Exercise per Session: 20 min  Stress: No Stress Concern Present (05/04/2023)   [employer] of Occupational Health - Occupational Stress Questionnaire     Feeling of Stress : Not at all  Social Connections: Socially Integrated (05/04/2023)   Social Connection and Isolation Panel [NHANES]    Frequency of Communication with Friends and Family: More than three times a week    Frequency of Social Gatherings with Friends and Family: Three times a week    Attends Religious Services: More than 4 times per year    Active Member of Clubs or Organizations: Yes    Attends Banker Meetings: More than 4 times per year    Marital Status: Married     Review of Systems  Constitutional:  Negative for appetite change and unexpected weight change.  HENT:  Negative for congestion and sinus pressure.   Respiratory:  Negative for cough, chest tightness and shortness of breath.   Cardiovascular:  Negative for chest pain, palpitations and leg swelling.  Gastrointestinal:  Negative for abdominal pain, diarrhea, nausea and vomiting.  Genitourinary:  Negative for difficulty urinating and dysuria.  Musculoskeletal:  Negative for joint swelling and myalgias.  Skin:  Negative for color change and rash.  Neurological:  Negative for dizziness and headaches.  Psychiatric/Behavioral:  Negative for agitation and dysphoric mood.        Objective:     BP 130/70   Pulse 76   Temp 98.2 F (36.8 C)   Resp 16   Ht 5\' 1"  (1.549 m)   Wt 177 lb (80.3 kg)   SpO2 98%   BMI 33.44 kg/m  Wt Readings from Last 3 Encounters:  05/24/23 177 lb (80.3 kg)  05/04/23 176 lb 6.4 oz (80 kg)  03/24/23 172 lb (78 kg)    Physical Exam Vitals reviewed.  Constitutional:      General: She is not in acute distress.    Appearance: Normal appearance.  HENT:     Head: Normocephalic and atraumatic.     Right Ear: External ear normal.     Left Ear: External ear normal.     Mouth/Throat:     Pharynx: No oropharyngeal exudate or posterior oropharyngeal erythema.  Eyes:     General: No scleral icterus.       Right eye: No discharge.        Left eye: No discharge.      Conjunctiva/sclera: Conjunctivae normal.  Neck:     Thyroid : No thyromegaly.  Cardiovascular:     Rate and Rhythm: Normal rate and regular rhythm.  Pulmonary:     Effort: No respiratory distress.     Breath sounds: Normal breath sounds. No wheezing.  Abdominal:     General: Bowel sounds are normal.  Palpations: Abdomen is soft.     Tenderness: There is no abdominal tenderness.  Musculoskeletal:        General: No swelling or tenderness.     Cervical back: Neck supple. No tenderness.  Lymphadenopathy:     Cervical: No cervical adenopathy.  Skin:    Findings: No erythema or rash.  Neurological:     Mental Status: She is alert.  Psychiatric:        Mood and Affect: Mood normal.        Behavior: Behavior normal.         Outpatient Encounter Medications as of 05/24/2023  Medication Sig   allopurinol  (ZYLOPRIM ) 100 MG tablet Take 1 tablet (100 mg total) by mouth daily.   aspirin EC 81 MG tablet Take 81 mg by mouth daily.   atorvastatin  (LIPITOR) 40 MG tablet Take 1 tablet (40 mg total) by mouth daily.   cabergoline  (DOSTINEX ) 0.5 MG tablet Take 0.5 tablets (0.25 mg total) by mouth daily.   cabergoline  (DOSTINEX ) 0.5 MG tablet Take 0.5 tablets (0.25 mg total) by mouth daily.   fluorouracil  (EFUDEX ) 5 % cream apply to left superior forehead near hairline, midline mid scalp vertex, mid frontal scalp twice a day x 14 days   lansoprazole  (PREVACID ) 30 MG capsule Take 1 capsule (30 mg total) by mouth daily at 12:00 noon.   levothyroxine  (SYNTHROID ) 100 MCG tablet Take 1 tablet (100 mcg total) by mouth daily before breakfast.   metoprolol  tartrate (LOPRESSOR ) 25 MG tablet Take 1 tablet (25 mg total) by mouth 2 (two) times daily   Multiple Vitamin (MULTIVITAMIN) tablet Take 1 tablet by mouth daily.   No facility-administered encounter medications on file as of 05/24/2023.     Lab Results  Component Value Date   WBC 8.1 07/07/2022   HGB 13.5 07/07/2022   HCT 40.3 07/07/2022   PLT  199.0 07/07/2022   GLUCOSE 109 (H) 11/06/2022   CHOL 157 11/06/2022   TRIG 292.0 (H) 11/06/2022   HDL 36.90 (L) 11/06/2022   LDLDIRECT 89.0 07/07/2022   LDLCALC 61 11/06/2022   ALT 39 (H) 11/06/2022   AST 28 11/06/2022   NA 142 11/06/2022   K 3.9 11/06/2022   CL 105 11/06/2022   CREATININE 0.96 11/06/2022   BUN 11 11/06/2022   CO2 29 11/06/2022   TSH 2.04 11/06/2022   HGBA1C 6.1 11/06/2022    US  LIMITED ULTRASOUND INCLUDING AXILLA LEFT BREAST  Result Date: 05/06/2023 CLINICAL DATA:  68 year old female presenting with a palpable area of concern with associated tenderness in the LEFT breast. Patient has strong family history of breast cancer. EXAM: DIGITAL DIAGNOSTIC UNILATERAL LEFT MAMMOGRAM WITH TOMOSYNTHESIS AND CAD; ULTRASOUND LEFT BREAST LIMITED TECHNIQUE: Left digital diagnostic mammography and breast tomosynthesis was performed. The images were evaluated with computer-aided detection. ; Targeted ultrasound examination of the left breast was performed. COMPARISON:  Previous exam(s). ACR Breast Density Category b: There are scattered areas of fibroglandular density. FINDINGS: Diagnostic mammographic images were obtained over the area of palpable concern and tenderness in the left breast. No suspicious mammographic finding is identified in this area. No suspicious mass, microcalcification, or other finding is identified in the left breast. On physical exam, prominent fat lobules are palpated in the area of concern in the inner left breast. No discrete mass is palpated. Targeted left breast ultrasound was performed by the sonographer and the physician in the area of palpable concern from 9-9:30 o'clock 8-14 cm from the nipple. No suspicious solid or  cystic mass or area of shadowing is identified. IMPRESSION: 1. No mammographic or sonographic abnormality in the area of palpable concern in the left breast. 2. No evidence of malignancy in the left breast. RECOMMENDATION: 1. Any further workup of  the patient's symptoms should be based on the clinical assessment. Recommend routine annual screening mammogram, next due in June 2025. 2. Given patient's family history of breast cancer, recommend formal breast cancer risk assessment. The American Cancer Society recommends annual MRI and mammography in patients with an estimated lifetime risk of developing breast cancer greater than 20%. If patient's lifetime risk is less than 20%, given breast density, supplemental screening with abbreviated breast MRI could be considered. If supplemental screening with MRI is performed, ideally it should be performed 6 months after the screening mammogram. I have discussed the findings and recommendations with the patient. If applicable, a reminder letter will be sent to the patient regarding the next appointment. BI-RADS CATEGORY  1: Negative. Electronically Signed   By: Sande Cromer M.D.   On: 05/06/2023 11:25   MM 3D DIAGNOSTIC MAMMOGRAM UNILATERAL LEFT BREAST Result Date: 05/06/2023 CLINICAL DATA:  68 year old female presenting with a palpable area of concern with associated tenderness in the LEFT breast. Patient has strong family history of breast cancer. EXAM: DIGITAL DIAGNOSTIC UNILATERAL LEFT MAMMOGRAM WITH TOMOSYNTHESIS AND CAD; ULTRASOUND LEFT BREAST LIMITED TECHNIQUE: Left digital diagnostic mammography and breast tomosynthesis was performed. The images were evaluated with computer-aided detection. ; Targeted ultrasound examination of the left breast was performed. COMPARISON:  Previous exam(s). ACR Breast Density Category b: There are scattered areas of fibroglandular density. FINDINGS: Diagnostic mammographic images were obtained over the area of palpable concern and tenderness in the left breast. No suspicious mammographic finding is identified in this area. No suspicious mass, microcalcification, or other finding is identified in the left breast. On physical exam, prominent fat lobules are palpated in the  area of concern in the inner left breast. No discrete mass is palpated. Targeted left breast ultrasound was performed by the sonographer and the physician in the area of palpable concern from 9-9:30 o'clock 8-14 cm from the nipple. No suspicious solid or cystic mass or area of shadowing is identified. IMPRESSION: 1. No mammographic or sonographic abnormality in the area of palpable concern in the left breast. 2. No evidence of malignancy in the left breast. RECOMMENDATION: 1. Any further workup of the patient's symptoms should be based on the clinical assessment. Recommend routine annual screening mammogram, next due in June 2025. 2. Given patient's family history of breast cancer, recommend formal breast cancer risk assessment. The American Cancer Society recommends annual MRI and mammography in patients with an estimated lifetime risk of developing breast cancer greater than 20%. If patient's lifetime risk is less than 20%, given breast density, supplemental screening with abbreviated breast MRI could be considered. If supplemental screening with MRI is performed, ideally it should be performed 6 months after the screening mammogram. I have discussed the findings and recommendations with the patient. If applicable, a reminder letter will be sent to the patient regarding the next appointment. BI-RADS CATEGORY  1: Negative. Electronically Signed   By: Sande Cromer M.D.   On: 05/06/2023 11:25       Assessment & Plan:  Breast tenderness Assessment & Plan: Recent mammogram (diagnostic mammogram and ultrasound left breast - [facility] I). Has had persistent issues with tenderness. Concern regarding family history as outlined. Discussed - The American Cancer Society recommends annual MRI and mammography in patients  with an estimated lifetime risk of developing breast cancer greater than 20%. If patient's lifetime risk is less than 20%, given breast density, supplemental screening with abbreviated breast MRI could  be considered. If supplemental screening with MRI is performed, ideally it should be performed 6 months after the screening mammogram. Discussed one calculation - GAIL model. Discussed her lifetime risk of 18.76%. she desires to pursue breast MRI for further evaluation. Discussed referral to genetic counseling and further genetic testing. Will notify me if agreeable.    Primary hypertension Assessment & Plan: Continue metoprolol  and triam/hctz.  Blood pressure as outlined. . Follow metabolic panel. Follow pressures.    Family history of breast cancer Assessment & Plan: Two sisters and one maternal aunt - breast cancer. Unsure of genetic testing. Discussed calculated risk models and recommendations based on lifetime risk. She desires MRI of breasts to further evaluate. Discussed genetic testing. Will notify me if agreeable for referral.       Dellar Fenton, MD

## 2023-05-24 ENCOUNTER — Ambulatory Visit (INDEPENDENT_AMBULATORY_CARE_PROVIDER_SITE_OTHER): Admitting: Internal Medicine

## 2023-05-24 ENCOUNTER — Encounter

## 2023-05-24 ENCOUNTER — Other Ambulatory Visit

## 2023-05-24 ENCOUNTER — Encounter: Payer: Self-pay | Admitting: Internal Medicine

## 2023-05-24 VITALS — BP 130/70 | HR 76 | Temp 98.2°F | Resp 16 | Ht 61.0 in | Wt 177.0 lb

## 2023-05-24 DIAGNOSIS — Z803 Family history of malignant neoplasm of breast: Secondary | ICD-10-CM

## 2023-05-24 DIAGNOSIS — N644 Mastodynia: Secondary | ICD-10-CM

## 2023-05-24 DIAGNOSIS — I10 Essential (primary) hypertension: Secondary | ICD-10-CM | POA: Diagnosis not present

## 2023-05-30 NOTE — Assessment & Plan Note (Signed)
 Continue metoprolol and triam/hctz.  Blood pressure as outlined. . Follow metabolic panel. Follow pressures.

## 2023-05-30 NOTE — Assessment & Plan Note (Signed)
 Two sisters and one maternal aunt - breast cancer. Unsure of genetic testing. Discussed calculated risk models and recommendations based on lifetime risk. She desires MRI of breasts to further evaluate. Discussed genetic testing. Will notify me if agreeable for referral.

## 2023-05-30 NOTE — Assessment & Plan Note (Signed)
 Recent mammogram (diagnostic mammogram and ultrasound left breast - [facility] I). Has had persistent issues with tenderness. Concern regarding family history as outlined. Discussed - The American Cancer Society recommends annual MRI and mammography in patients with an estimated lifetime risk of developing breast cancer greater than 20%. If patient's lifetime risk is less than 20%, given breast density, supplemental screening with abbreviated breast MRI could be considered. If supplemental screening with MRI is performed, ideally it should be performed 6 months after the screening mammogram. Discussed one calculation - GAIL model. Discussed her lifetime risk of 18.76%. she desires to pursue breast MRI for further evaluation. Discussed referral to genetic counseling and further genetic testing. Will notify me if agreeable.

## 2023-06-10 ENCOUNTER — Encounter: Payer: Self-pay | Admitting: Internal Medicine

## 2023-06-10 NOTE — Telephone Encounter (Signed)
 Please call her and let her know that the MRI has been ordered and please check with referral team about where we stand with scheduling. What I would like to do is to refer her to genetic counseling - if/when agreeable.

## 2023-06-11 NOTE — Telephone Encounter (Signed)
 Patient following up on MRI order.

## 2023-06-11 NOTE — Telephone Encounter (Signed)
 LM for pt to return call to discuss.

## 2023-06-12 ENCOUNTER — Encounter: Payer: Self-pay | Admitting: Emergency Medicine

## 2023-06-12 ENCOUNTER — Ambulatory Visit (INDEPENDENT_AMBULATORY_CARE_PROVIDER_SITE_OTHER)

## 2023-06-12 ENCOUNTER — Ambulatory Visit
Admission: EM | Admit: 2023-06-12 | Discharge: 2023-06-12 | Disposition: A | Discharge status: Home / Self Care | Attending: Physician Assistant | Admitting: Physician Assistant

## 2023-06-12 DIAGNOSIS — J189 Pneumonia, unspecified organism: Secondary | ICD-10-CM

## 2023-06-12 DIAGNOSIS — R059 Cough, unspecified: Secondary | ICD-10-CM | POA: Diagnosis not present

## 2023-06-12 DIAGNOSIS — R051 Acute cough: Secondary | ICD-10-CM | POA: Diagnosis not present

## 2023-06-12 DIAGNOSIS — R079 Chest pain, unspecified: Secondary | ICD-10-CM | POA: Diagnosis not present

## 2023-06-12 DIAGNOSIS — R509 Fever, unspecified: Secondary | ICD-10-CM | POA: Diagnosis not present

## 2023-06-12 LAB — RESP PANEL BY RT-PCR (FLU A&B, COVID) ARPGX2
Influenza A by PCR: NEGATIVE
Influenza B by PCR: NEGATIVE
SARS Coronavirus 2 by RT PCR: NEGATIVE

## 2023-06-12 MED ORDER — DOXYCYCLINE HYCLATE 100 MG PO CAPS: 100.0000 mg | ORAL_CAPSULE | Freq: Two times a day (BID) | ORAL | 0 refills | Status: AC

## 2023-06-12 MED ORDER — PROMETHAZINE-DM 6.25-15 MG/5ML PO SYRP: 5.0000 mL | ORAL_SOLUTION | Freq: Four times a day (QID) | ORAL | 0 refills | Status: DC | PRN

## 2023-06-12 NOTE — ED Provider Notes (Signed)
 [facility]    CSN: [id number] Arrival date & time: 06/12/23  0903      History   Chief Complaint Chief Complaint  Patient presents with   Cough   Nasal Congestion    HPI Marie Colon is a 68 y.o. female presenting for fever up to 101 degrees with fatigue, headaches, cough, congestion and chills that began yesterday worsened today.  Denies sore throat, sinus pain, or shortness of breath.  Reports chest pain with coughing.  Has been has been ill with similar symptoms and was seen by his PCP yesterday.  She says she was not feeling that badly when she took her husband to be checked out.  She over-the-counter cough medicine.  Medical history significant hypertension, hyperlipidemia, hypothyroidism, GERD, ulcerative colitis, and skin cancer  HPI  Past Medical History:  Diagnosis Date   Acromegaly (HCC)    Actinic keratosis    Angiomyolipoma of kidney    evaluated by Dr Cherylene Corrente   Basal cell carcinoma 10/01/2022   L forehead, EDC 10/20/22   Fatty liver    GERD (gastroesophageal reflux disease)    Heart murmur    per Dr. Dellar Fenton   History of pyelonephritis    History of ulcerative colitis    Hx of dysplastic nevus 04/21/2016   L upper back, moderate to severe atypia   Hypercholesterolemia    Hypertension    Hypothyroidism    Pituitary macroadenoma (HCC)    s/p removal (elevated growth hormone)   squamous cell carcinoma of skin 07/20/2022   mid frontal scalp, MOHs 08/26/22   Tubal pregnancy    s/p rupture   Wears glasses     Patient Active Problem List   Diagnosis Date Noted   Cough 08/15/2022   Bronchitis 08/12/2022   Squamous cell carcinoma of skin 07/20/2022   Breast pain, left 07/07/2022   COVID-19 02/24/2022   Immunization counseling 10/30/2021   Feels hot 10/30/2021   SOB (shortness of breath) on exertion 06/24/2021   Jaw pain 02/23/2021   Decreased GFR 02/17/2021   Black stool 10/08/2020   Acromegaly (HCC) 09/16/2019   LLQ  abdominal pain 08/04/2019   Tick bite of upper arm, left, initial encounter 06/27/2019   Nonintractable headache 06/27/2019   Rash 06/27/2019   Breast tenderness 03/27/2019   Hyperglycemia 01/25/2018   Gout 05/26/2015   Soft tissue mass 01/06/2015   Health care maintenance 04/29/2014   BMI 33.0-33.9,adult 12/15/2013   Renal insufficiency 04/05/2013   Family history of breast cancer 12/11/2012   Gastritis 11/28/2012   Family history of colonic polyps 11/28/2012   Pituitary macroadenoma (HCC) 12/05/2011   Fatty liver 12/05/2011   Angiomyolipoma of kidney 12/05/2011   Hypothyroidism 12/05/2011   Hypertension 11/24/2011   Hypercholesteremia 11/24/2011    Past Surgical History:  Procedure Laterality Date   ABDOMINAL HYSTERECTOMY  2003   abnormal uterine bleeding   APPENDECTOMY     cancer lesion removed from head  05/2022   CHOLECYSTECTOMY  2005   COLONOSCOPY WITH PROPOFOL  N/A 03/18/2017   Procedure: COLONOSCOPY WITH PROPOFOL ;  Surgeon: Deveron Fly, MD;  Location: [facility] ENDOSCOPY;  Service: Endoscopy;  Laterality: N/A;   DILATION AND CURETTAGE OF UTERUS     MASS EXCISION Right 08/29/2015   Procedure: EXCISION OF RIGHT THIGH MASS SUBFASICAL 15 CM;  Surgeon: Lockie Rima, MD;  Location: [facility] OR;  Service: General;  Laterality: Right;   pituitary tumor removal  2006    OB History   No obstetric  history on file.      Home Medications    Prior to Admission medications   Medication Sig Start Date End Date Taking? Authorizing Provider  doxycycline  (VIBRAMYCIN ) 100 MG capsule Take 1 capsule (100 mg total) by mouth 2 (two) times daily for 7 days. 06/12/23 06/19/23 Yes Floydene Hy, PA-C  promethazine-dextromethorphan (PROMETHAZINE-DM) 6.25-15 MG/5ML syrup Take 5 mLs by mouth 4 (four) times daily as needed. 06/12/23  Yes Floydene Hy, PA-C  allopurinol  (ZYLOPRIM ) 100 MG tablet Take 1 tablet (100 mg total) by mouth daily. 07/07/22 07/17/23  Dellar Fenton, MD  aspirin EC 81 MG  tablet Take 81 mg by mouth daily.    [provider]  atorvastatin  (LIPITOR) 40 MG tablet Take 1 tablet (40 mg total) by mouth daily. 01/12/23   Dellar Fenton, MD  cabergoline  (DOSTINEX ) 0.5 MG tablet Take 0.5 tablets (0.25 mg total) by mouth daily. 05/26/22     cabergoline  (DOSTINEX ) 0.5 MG tablet Take 0.5 tablets (0.25 mg total) by mouth daily. 11/30/22     fluorouracil  (EFUDEX ) 5 % cream apply to left superior forehead near hairline, midline mid scalp vertex, mid frontal scalp twice a day x 14 days 05/10/23   Elta Halter, MD  lansoprazole  (PREVACID ) 30 MG capsule Take 1 capsule (30 mg total) by mouth daily at 12:00 noon. 03/22/23 03/21/24  Dellar Fenton, MD  levothyroxine  (SYNTHROID ) 100 MCG tablet Take 1 tablet (100 mcg total) by mouth daily before breakfast. 01/04/23 01/04/24  Dellar Fenton, MD  metoprolol  tartrate (LOPRESSOR ) 25 MG tablet Take 1 tablet (25 mg total) by mouth 2 (two) times daily 01/12/23   Dellar Fenton, MD  Multiple Vitamin (MULTIVITAMIN) tablet Take 1 tablet by mouth daily.    [provider]    Family History Family History  Problem Relation Age of Onset   Brain cancer Father        died - 78   Diabetes Mother    Hypothyroidism Sister    Breast cancer Sister 23   Sjogren's syndrome Sister    Breast cancer Sister 33   Diabetes Other        grandmother    Social History Social History   Tobacco Use   Smoking status: Never   Smokeless tobacco: Never  Vaping Use   Vaping status: Never Used  Substance Use Topics   Alcohol use: No    Alcohol/week: 0.0 standard drinks of alcohol   Drug use: No     Allergies   Protonix [pantoprazole] and Penicillins   Review of Systems Review of Systems  Constitutional:  Positive for chills, fatigue and fever. Negative for diaphoresis.  HENT:  Positive for congestion and rhinorrhea. Negative for ear pain, sinus pressure, sinus pain and sore throat.   Respiratory:  Positive for cough. Negative  for shortness of breath.   Cardiovascular:  Positive for chest pain.  Gastrointestinal:  Negative for abdominal pain, nausea and vomiting.  Musculoskeletal:  Negative for myalgias.  Skin:  Negative for rash.  Neurological:  Positive for headaches. Negative for weakness.  Hematological:  Negative for adenopathy.     Physical Exam Triage Vital Signs ED Triage Vitals  Encounter Vitals Group     BP      Systolic BP Percentile      Diastolic BP Percentile      Pulse      Resp      Temp      Temp src      SpO2  Weight      Height      Head Circumference      Peak Flow      Pain Score      Pain Loc      Pain Education      Exclude from Growth Chart    No data found.  Updated Vital Signs BP 119/82 (BP Location: Right Arm)   Pulse 76   Temp 99 F (37.2 C) (Oral)   Resp 14   SpO2 96%    Physical Exam Vitals and nursing note reviewed.  Constitutional:      General: She is not in acute distress.    Appearance: Normal appearance. She is ill-appearing. She is not toxic-appearing.  HENT:     Head: Normocephalic and atraumatic.     Right Ear: Tympanic membrane, ear canal and external ear normal.     Left Ear: Tympanic membrane, ear canal and external ear normal.     Nose: Congestion present.     Mouth/Throat:     Mouth: Mucous membranes are moist.     Pharynx: Oropharynx is clear.  Eyes:     General: No scleral icterus.       Right eye: No discharge.        Left eye: No discharge.     Conjunctiva/sclera: Conjunctivae normal.  Cardiovascular:     Rate and Rhythm: Normal rate and regular rhythm.     Heart sounds: Normal heart sounds.  Pulmonary:     Effort: Pulmonary effort is normal. No respiratory distress.     Breath sounds: Normal breath sounds. No wheezing, rhonchi or rales.  Musculoskeletal:     Cervical back: Neck supple.  Skin:    General: Skin is dry.  Neurological:     General: No focal deficit present.     Mental Status: She is alert. Mental status  is at baseline.     Motor: No weakness.     Gait: Gait normal.  Psychiatric:        Mood and Affect: Mood normal.        Behavior: Behavior normal.      UC Treatments / Results  Labs (all labs ordered are listed, but only abnormal results are displayed) Labs Reviewed  RESP PANEL BY RT-PCR (FLU A&B, COVID) [id number]    EKG   Radiology DG Chest 2 View Result Date: 06/12/2023 CLINICAL DATA:  fever, cough and chest pain since yesterday EXAM: CHEST - 2 VIEW COMPARISON:  Chest x-ray 04/22/2011 FINDINGS: The heart and mediastinal contours are within normal limits. Developing vague patchy airspace and interstitial opacities along the right upper lobe. No pulmonary edema. No pleural effusion. No pneumothorax. No acute osseous abnormality. IMPRESSION: Developing right upper lobe pneumonia. Followup PA and lateral chest X-ray is recommended in 3-4 weeks following therapy to ensure resolution and exclude underlying malignancy. Electronically Signed   By: Morgane  Naveau M.D.   On: 06/12/2023 09:49    Procedures Procedures (including critical care time)  Medications Ordered in UC Medications - No data to display  Initial Impression / Assessment and Plan / UC Course  I have reviewed the triage vital signs and the nursing notes.  Pertinent labs & imaging results that were available during my care of the patient were reviewed by me and considered in my medical decision making (see chart for details).   68 year old presents for fever, fatigue, headaches, congestion, chest pain with coughing since yesterday.  Denies, shortness of husband has similar symptoms and  was seen by his primary care provider yesterday.  Vitals are all stable and normal.  She is mildly ill-appearing on exam.  Exam has nasal congestion.  Throat is clear.  Chest clear heart regular rate rhythm.  Respiratory panel and chest x-ray obtained given fever, cough, congestion and chest pain.  Will evaluate for flu, COVID and  pneumonia.  Negative resp panel.  CXR reveals suspected RUQ infiltrate. Will treat with doxycycline  at this time. History of severe reaction to PCNs. Also sent promethazine DM and discussed supportive care guidelines. Advised she should be improving as far as fever is concerned in a couple of days. Discussed importance of following up with PCP or returning for repeat chest x-ray in 3-4 weeks, sooner if feeling worse.   Final Clinical Impressions(s) / UC Diagnoses   Final diagnoses:  Acute cough  Pneumonia of right upper lobe due to infectious organism  Fever, unspecified     Discharge Instructions      - You were negative for COVID and flu. - Your x-ray shows developing right upper lobe pneumonia so I will send an antibiotic to the pharmacy for you.  Take full course.  You should be breaking the fever in the next 2 to 3 days.  If you are not breaking the fever or you are starting to feel worse or very short of breath please return for reevaluation. - Follow-up with PCP in 3 to 4 weeks to make sure pneumonia has resolved.  If you cannot see PCP please return to our department.   ED Prescriptions     Medication Sig Dispense Auth. Provider   doxycycline  (VIBRAMYCIN ) 100 MG capsule Take 1 capsule (100 mg total) by mouth 2 (two) times daily for 7 days. 14 capsule Floydene Hy, PA-C   promethazine-dextromethorphan (PROMETHAZINE-DM) 6.25-15 MG/5ML syrup Take 5 mLs by mouth 4 (four) times daily as needed. 118 mL Floydene Hy, PA-C      PDMP not reviewed this encounter.   Floydene Hy, PA-C 06/12/23 1014

## 2023-06-12 NOTE — ED Triage Notes (Signed)
 Patient c/o cough, congestion, nasal congestion, headaches, and chills that started yesterday.  Patient reports low grade fevers.

## 2023-06-12 NOTE — Discharge Instructions (Addendum)
-   You were negative for COVID and flu. - Your x-ray shows developing right upper lobe pneumonia so I will send an antibiotic to the pharmacy for you.  Take full course.  You should be breaking the fever in the next 2 to 3 days.  If you are not breaking the fever or you are starting to feel worse or very short of breath please return for reevaluation. - Follow-up with PCP in 3 to 4 weeks to make sure pneumonia has resolved.  If you cannot see PCP please return to our department.

## 2023-06-14 ENCOUNTER — Ambulatory Visit: Admitting: Family Medicine

## 2023-06-14 ENCOUNTER — Encounter: Payer: Self-pay | Admitting: Family Medicine

## 2023-06-14 ENCOUNTER — Ambulatory Visit: Payer: Self-pay

## 2023-06-14 ENCOUNTER — Other Ambulatory Visit: Payer: Self-pay

## 2023-06-14 VITALS — BP 120/68 | HR 72 | Temp 99.2°F | Resp 20 | Ht 61.0 in | Wt 172.1 lb

## 2023-06-14 DIAGNOSIS — J189 Pneumonia, unspecified organism: Secondary | ICD-10-CM

## 2023-06-14 DIAGNOSIS — R0602 Shortness of breath: Secondary | ICD-10-CM

## 2023-06-14 DIAGNOSIS — J040 Acute laryngitis: Secondary | ICD-10-CM

## 2023-06-14 DIAGNOSIS — H6121 Impacted cerumen, right ear: Secondary | ICD-10-CM

## 2023-06-14 LAB — COMPREHENSIVE METABOLIC PANEL WITH GFR
ALT: 47 U/L — ABNORMAL HIGH (ref 0–35)
AST: 33 U/L (ref 0–37)
Albumin: 4.1 g/dL (ref 3.5–5.2)
Alkaline Phosphatase: 88 U/L (ref 39–117)
BUN: 13 mg/dL (ref 6–23)
CO2: 30 meq/L (ref 19–32)
Calcium: 9.4 mg/dL (ref 8.4–10.5)
Chloride: 104 meq/L (ref 96–112)
Creatinine, Ser: 1.1 mg/dL (ref 0.40–1.20)
GFR: 51.83 mL/min — ABNORMAL LOW (ref >60.00)
Glucose, Bld: 95 mg/dL (ref 70–99)
Potassium: 3.5 meq/L (ref 3.5–5.1)
Sodium: 142 meq/L (ref 135–145)
Total Bilirubin: 0.5 mg/dL (ref 0.2–1.2)
Total Protein: 7.1 g/dL (ref 6.0–8.3)

## 2023-06-14 LAB — CBC WITH DIFFERENTIAL/PLATELET
Basophils Absolute: 0 10*3/uL (ref 0.0–0.1)
Basophils Relative: 0.4 % (ref 0.0–3.0)
Eosinophils Absolute: 0.2 10*3/uL (ref 0.0–0.7)
Eosinophils Relative: 2.2 % (ref 0.0–5.0)
HCT: 41.5 % (ref 36.0–46.0)
Hemoglobin: 14.1 g/dL (ref 12.0–15.0)
Lymphocytes Relative: 15.5 % (ref 12.0–46.0)
Lymphs Abs: 1.1 10*3/uL (ref 0.7–4.0)
MCHC: 34 g/dL (ref 30.0–36.0)
MCV: 87.5 fl (ref 78.0–100.0)
Monocytes Absolute: 0.7 10*3/uL (ref 0.1–1.0)
Monocytes Relative: 10.7 % (ref 3.0–12.0)
Neutro Abs: 4.9 10*3/uL (ref 1.4–7.7)
Neutrophils Relative %: 71.2 % (ref 43.0–77.0)
Platelets: 152 10*3/uL (ref 150.0–400.0)
RBC: 4.74 Mil/uL (ref 3.87–5.11)
RDW: 14.1 % (ref 11.5–15.5)
WBC: 6.9 10*3/uL (ref 4.0–10.5)

## 2023-06-14 MED ORDER — PREDNISONE 20 MG PO TABS
20.0000 mg | ORAL_TABLET | Freq: Every day | ORAL | 0 refills | Status: AC
  Filled 2023-06-14: qty 5, 5d supply, fill #0

## 2023-06-14 MED ORDER — ALBUTEROL SULFATE HFA 108 (90 BASE) MCG/ACT IN AERS
2.0000 | INHALATION_SPRAY | Freq: Four times a day (QID) | RESPIRATORY_TRACT | 0 refills | Status: DC | PRN
  Filled 2023-06-14: qty 6.7, 25d supply, fill #0

## 2023-06-14 MED ORDER — BENZONATATE 100 MG PO CAPS
200.0000 mg | ORAL_CAPSULE | Freq: Three times a day (TID) | ORAL | 0 refills | Status: DC | PRN
  Filled 2023-06-14: qty 20, 4d supply, fill #0

## 2023-06-14 NOTE — Patient Instructions (Addendum)
 It was a pleasure meeting you today. Thank you for allowing me to take part in your health care.  Our goals for today as we discussed include:  We will get some labs today.  If they are abnormal or we need to do something about them, I will call you.  If they are normal, I will send you a message on MyChart (if it is active) or a letter in the mail.  If you don't hear from us  in 2 weeks, please call the office at the number below.   Albuterol  1-2 puffs every 6 hours for shortness of breath or wheezing Start Prednisone  20 mg daily for 5 days  Continue Doxycycline  as prescribed  Repeat chest xray 3-4 weeks after completion of antibiotics.  Around June 19th.    Follow up with PCP as scheduled    You can take Tylenol and/or Ibuprofen as needed for fever reduction and pain relief.   For cough: honey 1/2 to 1 teaspoon (you can dilute the honey in water or another fluid).  You can also use guaifenesin  and dextromethorphan for cough. You can use a humidifier for chest congestion and cough.  If you don't have a humidifier, you can sit in the bathroom with the hot shower running.      For sore throat: try warm salt water gargles, cepacol lozenges, throat spray, warm tea or water with lemon/honey, popsicles or ice, or OTC cold relief medicine for throat discomfort.   For congestion: take a daily anti-histamine like Zyrtec, Claritin, and a oral decongestant, such as pseudoephedrine.  You can also use Flonase  1-2 sprays in each nostril daily.   It is important to stay hydrated: drink plenty of fluids (water, gatorade/powerade/pedialyte, juices, or teas) to keep your throat moisturized and help further relieve irritation/discomfort.    This is a list of the screening recommended for you and due dates:  Health Maintenance  Topic Date Due   COVID-19 Vaccine (7 - Pfizer risk 2024-25 season) 05/07/2023   Mammogram  07/24/2023   Flu Shot  08/27/2023   Medicare Annual Wellness Visit  03/23/2024    DTaP/Tdap/Td vaccine (3 - Td or Tdap) 05/26/2025   Colon Cancer Screening  01/10/2033   Pneumonia Vaccine  Completed   DEXA scan (bone density measurement)  Completed   Hepatitis C Screening  Completed   Zoster (Shingles) Vaccine  Completed   HPV Vaccine  Aged Out   Meningitis B Vaccine  Aged Out      If you have any questions or concerns, please do not hesitate to call the office at [phone].  I look forward to our next visit and until then take care and stay safe.  Regards,   Valli Gaw, MD   [facility]

## 2023-06-14 NOTE — Telephone Encounter (Signed)
 Chief Complaint: SOB Symptoms: cough, wheezing Frequency: x 4 days Pertinent Negatives: Patient denies CP Disposition: [] ED /[] Urgent Care (no appt availability in office) / [x] Appointment(In office/virtual)/ []  [facility]/ [] Home Care/ [] Refused Recommended Disposition /[] [facility]/ []  Follow-up with PCP Additional Notes: Pt reports [facility]. Pt reports she is not feeling better with the Doxycycline  prescribed, notes new wheezing last evening. Reports SOB, dry cough that began Friday. OV scheduled. This RN educated pt on home care, new-worsening symptoms, when to call back/seek emergent care. Pt verbalized understanding and agrees to plan.    Copied from CRM [id number]. Topic: Clinical - Red Word Triage >> Jun 14, 2023  7:37 AM Juluis Ok wrote: [facility] that prompted transfer to Nurse Triage: cough, wheezing, SOB Reason for Disposition  [1] MILD difficulty breathing (e.g., minimal/no SOB at rest, SOB with walking, pulse <100) AND [2] NEW-onset or WORSE than normal  Answer Assessment - Initial Assessment Questions 1. RESPIRATORY STATUS: "Describe your breathing?" (e.g., wheezing, shortness of breath, unable to speak, severe coughing)      SOB, wheezing 2. ONSET: "When did this breathing problem begin?"      X 4 days 3. PATTERN "Does the difficult breathing come and go, or has it been constant since it started?"      Intermittent 4. SEVERITY: "How bad is your breathing?" (e.g., mild, moderate, severe)    - MILD: No SOB at rest, mild SOB with walking, speaks normally in sentences, can lie down, no retractions, pulse < 100.    - MODERATE: SOB at rest, SOB with minimal exertion and prefers to sit, cannot lie down flat, speaks in phrases, mild retractions, audible wheezing, pulse 100-120.    - SEVERE: Very SOB at rest, speaks in single words, struggling to breathe, sitting hunched forward, retractions, pulse >  120      Moderate 8. CAUSE: "What do you think is causing the breathing problem?"      [facility] reports [facility] 9. OTHER SYMPTOMS: "Do you have any other symptoms? (e.g., dizziness, runny nose, cough, chest pain, fever)     Cough, wheezing, fever Friday evening  Protocols used: Breathing Difficulty-A-AH

## 2023-06-14 NOTE — Telephone Encounter (Signed)
 Noted. Dr Geralyn Knee is out of office.

## 2023-06-14 NOTE — Progress Notes (Unsigned)
 SUBJECTIVE:   Chief Complaint  Patient presents with   Wheezing    Went to UC on 06/12/2023 pt has pneu   HPI Presents for acute visit  Discussed the use of AI scribe software for clinical note transcription with the patient, who gave verbal consent to proceed.  History of Present Illness Marie Colon is a 68 year old female who presents with persistent cough and wheezing after treatment for suspected pneumonia.  She visited the emergency department a few days ago and was diagnosed with suspected pneumonia. She was prescribed doxycycline  and promethazine . Despite this treatment, she continues to experience a persistent cough and has noticed wheezing, particularly when lying in bed. The cough was initially dry and worsened over the evening, accompanied by fever, prompting her to seek care at a walk-in clinic.  She feels exhausted and notes that the doxycycline  does not seem to be providing significant improvement. A viral panel was conducted, which ruled out COVID-19 and influenza. She experiences shortness of breath, described as 'having to breathe heavier' than usual, especially when walking or wearing a mask. No gasping for air, but finds it more difficult to breathe. She also reports chest and back pain when taking deep breaths.  Her ears feel stuffed up, and she occasionally blows her nose, but there is not much nasal discharge. She denies a history of asthma or COPD and has never been a smoker, although her father was a smoker. She has not been around anyone sick recently, except for her husband, who had a sinus infection. Her family history includes her mother having COPD, kidney issues, and bypass surgery. She is currently on Toprol  for blood pressure management.     PERTINENT PMH / PSH: As above  OBJECTIVE:  BP 120/68   Pulse 72   Temp 99.2 F (37.3 C)   Resp 20   Ht 5\' 1"  (1.549 m)   Wt 172 lb 2 oz (78.1 kg)   SpO2 98%   BMI 32.52 kg/m    Physical Exam Vitals  reviewed.  Constitutional:      General: She is not in acute distress.    Appearance: Normal appearance. She is normal weight. She is not ill-appearing, toxic-appearing or diaphoretic.  Eyes:     General:        Right eye: No discharge.        Left eye: No discharge.     Conjunctiva/sclera: Conjunctivae normal.  Cardiovascular:     Rate and Rhythm: Normal rate and regular rhythm.     Heart sounds: Normal heart sounds.  Pulmonary:     Breath sounds: No wheezing.  Musculoskeletal:        General: Normal range of motion.  Skin:    General: Skin is warm and dry.  Neurological:     General: No focal deficit present.     Mental Status: She is alert and oriented to person, place, and time. Mental status is at baseline.  Psychiatric:        Mood and Affect: Mood normal.        Behavior: Behavior normal.        Thought Content: Thought content normal.        Judgment: Judgment normal.    {Perform Simple Foot Exam  Perform Detailed exam:1} {Insert foot Exam (Optional):30965}      06/14/2023   10:26 AM 03/24/2023    3:22 PM 08/12/2022   11:37 AM 03/23/2022    3:05 PM 02/24/2022  10:44 AM  Depression screen PHQ 2/9  Decreased Interest 1 0 0 0 0  Down, Depressed, Hopeless 0 0 0 0 0  PHQ - 2 Score 1 0 0 0 0  Altered sleeping 0 1     Tired, decreased energy 1 1     Change in appetite 1 0     Feeling bad or failure about yourself  0 0     Trouble concentrating 1 0     Moving slowly or fidgety/restless 1 0     Suicidal thoughts 0 0     PHQ-9 Score 5 2     Difficult doing work/chores  Not difficult at all         06/14/2023   10:26 AM  GAD 7 : Generalized Anxiety Score  Nervous, Anxious, on Edge 0  Control/stop worrying 0  Worry too much - different things 0  Trouble relaxing 0  Restless 0  Easily annoyed or irritable 0  Afraid - awful might happen 0  Total GAD 7 Score 0  Anxiety Difficulty Not difficult at all    ASSESSMENT/PLAN:  SOB (shortness of breath) on  exertion Assessment & Plan: Recently evaluated and treated for RUL pneumonia. Symptoms persistent symptoms despite treatment.  COVID and flu ruled out. - Continue doxycycline  as prescribed. - Order CBC to assess white blood cell count and kidney function before initiating steroids. - Prescribe albuterol  inhaler for shortness of breath, to be used every 4-6 hours as needed. - Discussed potential side effects of albuterol , including increased heart rate. - Order follow-up chest x-ray in 3 weeks post-doxycycline  treatment.  Addendum D Dimer elevated.  Given no improvement with antibiotics and abnormal xray will order CTA chest stat. Patient aware   Orders: -     CBC with Differential/Platelet -     Comprehensive metabolic panel with GFR -     Albuterol  Sulfate HFA; Inhale 2 puffs into the lungs every 6 (six) hours as needed for wheezing or shortness of breath.  Dispense: 6.7 g; Refill: 0 -     D-dimer, quantitative  Laryngitis Assessment & Plan: Laryngitis likely viral, causing hoarseness and difficulty speaking. Steroids may reduce wheezing. - Prescribe steroids to address laryngitis and potential wheezing.  Orders: -     predniSONE ; Take 1 tablet (20 mg total) by mouth daily with breakfast for 5 days.  Dispense: 5 tablet; Refill: 0 -     Benzonatate ; Take 2 capsules (200 mg total) by mouth 3 (three) times daily as needed.  Dispense: 20 capsule; Refill: 0  Community acquired pneumonia of right upper lobe of lung Assessment & Plan: No improvement with antibiotics. Hemodynamically stable Abnormal chest xray and was recommended to repeat in 3-4 weeks.  This has been ordered for follow up. Continue antibiotics as prescribed    Orders: -     DG Chest 2 View; Future  Impacted cerumen of right ear Assessment & Plan: Bilateral earwax impaction obstructing view of tympanic membranes. - Recommend Debrox ear drops, 5 drops twice daily in each ear to soften wax. - Advise follow-up in  nurse clinic for ear irrigation if symptoms persist after using Debrox.    PDMP reviewed  Return if symptoms worsen or fail to improve, for PCP.  Valli Gaw, MD

## 2023-06-15 ENCOUNTER — Encounter: Payer: Self-pay | Admitting: Family Medicine

## 2023-06-15 ENCOUNTER — Other Ambulatory Visit: Payer: Self-pay | Admitting: Family Medicine

## 2023-06-15 ENCOUNTER — Ambulatory Visit: Payer: Self-pay

## 2023-06-15 ENCOUNTER — Ambulatory Visit
Admission: RE | Admit: 2023-06-15 | Discharge: 2023-06-15 | Disposition: A | Discharge status: Home / Self Care | Source: Ambulatory Visit | Attending: Family Medicine | Admitting: Family Medicine

## 2023-06-15 DIAGNOSIS — R0602 Shortness of breath: Secondary | ICD-10-CM | POA: Insufficient documentation

## 2023-06-15 DIAGNOSIS — H6120 Impacted cerumen, unspecified ear: Secondary | ICD-10-CM | POA: Insufficient documentation

## 2023-06-15 DIAGNOSIS — J040 Acute laryngitis: Secondary | ICD-10-CM | POA: Insufficient documentation

## 2023-06-15 DIAGNOSIS — R918 Other nonspecific abnormal finding of lung field: Secondary | ICD-10-CM | POA: Diagnosis not present

## 2023-06-15 DIAGNOSIS — J189 Pneumonia, unspecified organism: Secondary | ICD-10-CM | POA: Insufficient documentation

## 2023-06-15 LAB — D-DIMER, QUANTITATIVE: D-Dimer, Quant: 0.79 ug{FEU}/mL — ABNORMAL HIGH (ref <0.50)

## 2023-06-15 MED ORDER — IOHEXOL 350 MG/ML SOLN
75.0000 mL | Freq: Once | INTRAVENOUS | Status: AC | PRN
  Administered 2023-06-15: 75 mL via INTRAVENOUS

## 2023-06-15 NOTE — Assessment & Plan Note (Signed)
 No improvement with antibiotics. Hemodynamically stable Abnormal chest xray and was recommended to repeat in 3-4 weeks.  This has been ordered for follow up. Continue antibiotics as prescribed

## 2023-06-15 NOTE — Assessment & Plan Note (Signed)
 Laryngitis likely viral, causing hoarseness and difficulty speaking. Steroids may reduce wheezing. - Prescribe steroids to address laryngitis and potential wheezing.

## 2023-06-15 NOTE — Assessment & Plan Note (Signed)
 Bilateral earwax impaction obstructing view of tympanic membranes. - Recommend Debrox ear drops, 5 drops twice daily in each ear to soften wax. - Advise follow-up in nurse clinic for ear irrigation if symptoms persist after using Debrox.

## 2023-06-15 NOTE — Assessment & Plan Note (Signed)
 Recently evaluated and treated for RUL pneumonia. Symptoms persistent symptoms despite treatment.  COVID and flu ruled out. - Continue doxycycline  as prescribed. - Order CBC to assess white blood cell count and kidney function before initiating steroids. - Prescribe albuterol  inhaler for shortness of breath, to be used every 4-6 hours as needed. - Discussed potential side effects of albuterol , including increased heart rate. - Order follow-up chest x-ray in 3 weeks post-doxycycline  treatment.  Addendum D Dimer elevated.  Given no improvement with antibiotics and abnormal xray will order CTA chest stat. Patient aware

## 2023-06-19 ENCOUNTER — Ambulatory Visit: Payer: Self-pay | Admitting: Internal Medicine

## 2023-06-23 NOTE — Telephone Encounter (Signed)
 I called pt and she stated that she feeling better, but still having some coughing.      No improvement with antibiotics. Hemodynamically stable Abnormal chest xray and was recommended to repeat in 3-4 weeks.  This has been ordered for follow up. Continue antibiotics as prescribed

## 2023-06-23 NOTE — Telephone Encounter (Signed)
 Patient is calling the breast center to f/u on mammogram order. She received auth in the mail.

## 2023-06-25 ENCOUNTER — Other Ambulatory Visit: Payer: Self-pay

## 2023-06-25 ENCOUNTER — Other Ambulatory Visit: Payer: Self-pay | Admitting: Internal Medicine

## 2023-06-25 MED ORDER — ALLOPURINOL 100 MG PO TABS
100.0000 mg | ORAL_TABLET | Freq: Every day | ORAL | 0 refills | Status: DC
  Filled 2023-06-25: qty 90, 90d supply, fill #0

## 2023-06-25 MED FILL — Atorvastatin Calcium Tab 40 MG (Base Equivalent): ORAL | 90 days supply | Qty: 90 | Fill #2 | Status: AC

## 2023-06-25 MED FILL — Metoprolol Tartrate Tab 25 MG: ORAL | 90 days supply | Qty: 180 | Fill #2 | Status: AC

## 2023-06-29 ENCOUNTER — Ambulatory Visit: Payer: Self-pay | Admitting: Internal Medicine

## 2023-06-29 ENCOUNTER — Ambulatory Visit
Admission: RE | Admit: 2023-06-29 | Discharge: 2023-06-29 | Disposition: A | Discharge status: Home / Self Care | Source: Ambulatory Visit | Attending: Internal Medicine | Admitting: Internal Medicine

## 2023-06-29 DIAGNOSIS — N644 Mastodynia: Secondary | ICD-10-CM | POA: Diagnosis not present

## 2023-06-29 DIAGNOSIS — Z1239 Encounter for other screening for malignant neoplasm of breast: Secondary | ICD-10-CM | POA: Diagnosis not present

## 2023-06-29 DIAGNOSIS — Z803 Family history of malignant neoplasm of breast: Secondary | ICD-10-CM | POA: Diagnosis not present

## 2023-06-29 MED ORDER — GADOBUTROL 1 MMOL/ML IV SOLN
7.5000 mL | Freq: Once | INTRAVENOUS | Status: AC | PRN
  Administered 2023-06-29: 7.5 mL via INTRAVENOUS

## 2023-06-30 ENCOUNTER — Other Ambulatory Visit: Payer: Self-pay | Admitting: Internal Medicine

## 2023-06-30 DIAGNOSIS — R9389 Abnormal findings on diagnostic imaging of other specified body structures: Secondary | ICD-10-CM

## 2023-06-30 DIAGNOSIS — J189 Pneumonia, unspecified organism: Secondary | ICD-10-CM

## 2023-06-30 DIAGNOSIS — E220 Acromegaly and pituitary gigantism: Secondary | ICD-10-CM | POA: Diagnosis not present

## 2023-06-30 NOTE — Telephone Encounter (Signed)
 Copied from CRM [id number]. Topic: Clinical - Lab/Test Results >> Jun 30, 2023  9:34 AM Juleen Oakland F wrote: Reason for CRM: Patient returned Marie Colon's phone call, I let her know that the breast MRI was okay. Regarding the pneumonia, patient says she's still coughing and wants to know if she can have her follow up chest x-ray done on Monday 07/05/23 since she will already be there for lab work.

## 2023-06-30 NOTE — Progress Notes (Signed)
Order placed for cxr.  

## 2023-07-05 ENCOUNTER — Ambulatory Visit: Payer: Self-pay | Admitting: Internal Medicine

## 2023-07-05 ENCOUNTER — Other Ambulatory Visit (INDEPENDENT_AMBULATORY_CARE_PROVIDER_SITE_OTHER): Payer: Medicare Other

## 2023-07-05 ENCOUNTER — Ambulatory Visit (INDEPENDENT_AMBULATORY_CARE_PROVIDER_SITE_OTHER)

## 2023-07-05 DIAGNOSIS — J189 Pneumonia, unspecified organism: Secondary | ICD-10-CM

## 2023-07-05 DIAGNOSIS — R0989 Other specified symptoms and signs involving the circulatory and respiratory systems: Secondary | ICD-10-CM | POA: Diagnosis not present

## 2023-07-05 DIAGNOSIS — R059 Cough, unspecified: Secondary | ICD-10-CM | POA: Diagnosis not present

## 2023-07-05 DIAGNOSIS — E7800 Pure hypercholesterolemia, unspecified: Secondary | ICD-10-CM

## 2023-07-05 LAB — BASIC METABOLIC PANEL WITH GFR
BUN: 12 mg/dL (ref 6–23)
CO2: 30 meq/L (ref 19–32)
Calcium: 9.1 mg/dL (ref 8.4–10.5)
Chloride: 106 meq/L (ref 96–112)
Creatinine, Ser: 0.96 mg/dL (ref 0.40–1.20)
GFR: 61 mL/min (ref >60.00)
Glucose, Bld: 99 mg/dL (ref 70–99)
Potassium: 4.2 meq/L (ref 3.5–5.1)
Sodium: 144 meq/L (ref 135–145)

## 2023-07-05 LAB — LIPID PANEL
Cholesterol: 165 mg/dL (ref 0–200)
HDL: 38.4 mg/dL — ABNORMAL LOW (ref >39.00)
LDL Cholesterol: 89 mg/dL (ref 0–99)
NonHDL: 126.17
Total CHOL/HDL Ratio: 4
Triglycerides: 186 mg/dL — ABNORMAL HIGH (ref 0.0–149.0)
VLDL: 37.2 mg/dL (ref 0.0–40.0)

## 2023-07-05 LAB — HEPATIC FUNCTION PANEL
ALT: 49 U/L — ABNORMAL HIGH (ref 0–35)
AST: 35 U/L (ref 0–37)
Albumin: 4.1 g/dL (ref 3.5–5.2)
Alkaline Phosphatase: 87 U/L (ref 39–117)
Bilirubin, Direct: 0.1 mg/dL (ref 0.0–0.3)
Total Bilirubin: 0.5 mg/dL (ref 0.2–1.2)
Total Protein: 6.3 g/dL (ref 6.0–8.3)

## 2023-07-08 ENCOUNTER — Ambulatory Visit (INDEPENDENT_AMBULATORY_CARE_PROVIDER_SITE_OTHER): Payer: Medicare Other | Admitting: Internal Medicine

## 2023-07-08 ENCOUNTER — Other Ambulatory Visit: Payer: Self-pay

## 2023-07-08 VITALS — BP 114/72 | HR 71 | Temp 98.0°F | Resp 16 | Ht 61.0 in | Wt 176.2 lb

## 2023-07-08 DIAGNOSIS — Z0000 Encounter for general adult medical examination without abnormal findings: Secondary | ICD-10-CM | POA: Diagnosis not present

## 2023-07-08 DIAGNOSIS — E220 Acromegaly and pituitary gigantism: Secondary | ICD-10-CM | POA: Diagnosis not present

## 2023-07-08 DIAGNOSIS — R739 Hyperglycemia, unspecified: Secondary | ICD-10-CM

## 2023-07-08 DIAGNOSIS — E039 Hypothyroidism, unspecified: Secondary | ICD-10-CM | POA: Diagnosis not present

## 2023-07-08 DIAGNOSIS — H6121 Impacted cerumen, right ear: Secondary | ICD-10-CM

## 2023-07-08 DIAGNOSIS — J189 Pneumonia, unspecified organism: Secondary | ICD-10-CM

## 2023-07-08 DIAGNOSIS — I10 Essential (primary) hypertension: Secondary | ICD-10-CM

## 2023-07-08 DIAGNOSIS — E7800 Pure hypercholesterolemia, unspecified: Secondary | ICD-10-CM | POA: Diagnosis not present

## 2023-07-08 DIAGNOSIS — Z87898 Personal history of other specified conditions: Secondary | ICD-10-CM | POA: Diagnosis not present

## 2023-07-08 DIAGNOSIS — K760 Fatty (change of) liver, not elsewhere classified: Secondary | ICD-10-CM

## 2023-07-08 MED ORDER — PREDNISONE 10 MG PO TABS
ORAL_TABLET | ORAL | 0 refills | Status: AC
  Filled 2023-07-08: qty 18, 8d supply, fill #0

## 2023-07-08 NOTE — Assessment & Plan Note (Signed)
 On pravastatin .  Low cholesterol diet and exercise.  Follow lipid panel and liver function tests.  Lab Results  Component Value Date   CHOL 165 07/05/2023   HDL 38.40 (L) 07/05/2023   LDLCALC 89 07/05/2023   LDLDIRECT 89.0 07/07/2022   TRIG 186.0 (H) 07/05/2023   CHOLHDL 4 07/05/2023

## 2023-07-08 NOTE — Progress Notes (Signed)
 Subjective:    Patient ID: Marie Colon, female    DOB: 01/07/1956, 68 y.o.   MRN: [id number]  Patient here for  Chief Complaint  Patient presents with   Annual Exam    HPI Here for physical exam. Saw Dr Sueanne Emerald 5/19 - persistent cough and wheezing after treatment for suspected pneumonia. Treated with doxycycline . D-dimer elevated. CTA chest - right upper lobe airspace infiltrate most likely sequelae of pneumonia. No PE. Reports she is doing better. Feeling better overall. Still with some cough - worse at night. Ears - squeak. No pain. Has used debrox previously. Some nasal congestion and drainage. No sob. No abdominal pain reported. Discussed cxr results.    Past Medical History:  Diagnosis Date   Acromegaly (HCC)    Actinic keratosis    Angiomyolipoma of kidney    evaluated by Dr Cherylene Corrente   Basal cell carcinoma 10/01/2022   L forehead, EDC 10/20/22   Fatty liver    GERD (gastroesophageal reflux disease)    Heart murmur    per Dr. Dellar Fenton   History of pyelonephritis    History of ulcerative colitis    Hx of dysplastic nevus 04/21/2016   L upper back, moderate to severe atypia   Hypercholesterolemia    Hypertension    Hypothyroidism    Pituitary macroadenoma (HCC)    s/p removal (elevated growth hormone)   squamous cell carcinoma of skin 07/20/2022   mid frontal scalp, MOHs 08/26/22   Tubal pregnancy    s/p rupture   Wears glasses    Past Surgical History:  Procedure Laterality Date   ABDOMINAL HYSTERECTOMY  2003   abnormal uterine bleeding   APPENDECTOMY     cancer lesion removed from head  05/2022   CHOLECYSTECTOMY  2005   COLONOSCOPY WITH PROPOFOL  N/A 03/18/2017   Procedure: COLONOSCOPY WITH PROPOFOL ;  Surgeon: Deveron Fly, MD;  Location: [facility] ENDOSCOPY;  Service: Endoscopy;  Laterality: N/A;   DILATION AND CURETTAGE OF UTERUS     MASS EXCISION Right 08/29/2015   Procedure: EXCISION OF RIGHT THIGH MASS SUBFASICAL 15 CM;  Surgeon: Lockie Rima,  MD;  Location: [facility] OR;  Service: General;  Laterality: Right;   pituitary tumor removal  2006   Family History  Problem Relation Age of Onset   Brain cancer Father        died - 40   Diabetes Mother    Hypothyroidism Sister    Breast cancer Sister 69   Sjogren's syndrome Sister    Breast cancer Sister 67   Diabetes Other        grandmother   Social History   Socioeconomic History   Marital status: Married    Spouse name: Not on file   Number of children: 2   Years of education: Not on file   Highest education level: 12th grade  Occupational History    Employer: [employer]    Comment: [employer] - [employer]  Tobacco Use   Smoking status: Never   Smokeless tobacco: Never  Vaping Use   Vaping status: Never Used  Substance and Sexual Activity   Alcohol use: No    Alcohol/week: 0.0 standard drinks of alcohol   Drug use: No   Sexual activity: Not on file  Other Topics Concern   Not on file  Social History Narrative   Married   Social Drivers of Health   Financial Resource Strain: Low Risk  (07/08/2023)   Overall Financial Resource Strain (CARDIA)  Difficulty of Paying Living Expenses: Not very hard  Food Insecurity: No Food Insecurity (07/08/2023)   Hunger Vital Sign    Worried About Running Out of Food in the Last Year: Never true    Ran Out of Food in the Last Year: Never true  Transportation Needs: No Transportation Needs (07/08/2023)   PRAPARE - Administrator, Civil Service (Medical): No    Lack of Transportation (Non-Medical): No  Physical Activity: Insufficiently Active (07/08/2023)   Exercise Vital Sign    Days of Exercise per Week: 2 days    Minutes of Exercise per Session: 10 min  Stress: No Stress Concern Present (07/08/2023)   [employer] of Occupational Health - Occupational Stress Questionnaire    Feeling of Stress: Not at all  Social Connections: Socially Integrated (07/08/2023)   Social Connection and Isolation Panel    Frequency of Communication  with Friends and Family: More than three times a week    Frequency of Social Gatherings with Friends and Family: Once a week    Attends Religious Services: More than 4 times per year    Active Member of [employer] or Organizations: Yes    Attends Engineer, structural: More than 4 times per year    Marital Status: Married     Review of Systems  Constitutional:  Negative for appetite change and unexpected weight change.  HENT:  Positive for congestion and postnasal drip.   Respiratory:  Positive for cough. Negative for chest tightness and shortness of breath.   Cardiovascular:  Negative for chest pain, palpitations and leg swelling.  Gastrointestinal:  Negative for abdominal pain, diarrhea, nausea and vomiting.  Genitourinary:  Negative for difficulty urinating and dysuria.  Musculoskeletal:  Negative for joint swelling and myalgias.  Skin:  Negative for color change and rash.  Neurological:  Negative for dizziness and headaches.  Psychiatric/Behavioral:  Negative for agitation and dysphoric mood.        Objective:     BP 114/72   Pulse 71   Temp 98 F (36.7 C)   Resp 16   Ht 5' 1 (1.549 m)   Wt 176 lb 3.2 oz (79.9 kg)   SpO2 98%   BMI 33.29 kg/m  Wt Readings from Last 3 Encounters:  07/08/23 176 lb 3.2 oz (79.9 kg)  06/14/23 172 lb 2 oz (78.1 kg)  05/24/23 177 lb (80.3 kg)    Physical Exam Vitals reviewed.  Constitutional:      General: She is not in acute distress.    Appearance: Normal appearance.  HENT:     Head: Normocephalic and atraumatic.     Right Ear: External ear normal.     Left Ear: External ear normal.     Mouth/Throat:     Pharynx: No oropharyngeal exudate or posterior oropharyngeal erythema.   Eyes:     General: No scleral icterus.       Right eye: No discharge.        Left eye: No discharge.     Conjunctiva/sclera: Conjunctivae normal.   Neck:     Thyroid : No thyromegaly.   Cardiovascular:     Rate and Rhythm: Normal rate and regular  rhythm.  Pulmonary:     Effort: No respiratory distress.     Breath sounds: Normal breath sounds. No wheezing.     Comments: Increased cough with forced expiration.  Abdominal:     General: Bowel sounds are normal.     Palpations: Abdomen is soft.  Tenderness: There is no abdominal tenderness.   Musculoskeletal:        General: No swelling or tenderness.     Cervical back: Neck supple. No tenderness.  Lymphadenopathy:     Cervical: No cervical adenopathy.   Skin:    Findings: No erythema or rash.   Neurological:     Mental Status: She is alert.   Psychiatric:        Mood and Affect: Mood normal.        Behavior: Behavior normal.         Outpatient Encounter Medications as of 07/08/2023  Medication Sig   predniSONE  (DELTASONE ) 10 MG tablet 4 tablets (40 mg total) daily for 1 day, THEN 3.5 tablets (35 mg total) daily for 1 day, THEN 3 tablets (30 mg total) daily for 1 day, THEN 2.5 tablets (25 mg total) daily for 1 day, THEN 2 tablets (20 mg total) daily for 1 day, THEN 1.5 tablets (15 mg total) daily for 1 day, THEN 1 tablet (10 mg total) daily for 1 day, THEN 0.5 tablets (5 mg total) daily for 1 day.   albuterol  (VENTOLIN  HFA) 108 (90 Base) MCG/ACT inhaler Inhale 2 puffs into the lungs every 6 (six) hours as needed for wheezing or shortness of breath.   allopurinol  (ZYLOPRIM ) 100 MG tablet Take 1 tablet (100 mg total) by mouth daily.   aspirin EC 81 MG tablet Take 81 mg by mouth daily.   atorvastatin  (LIPITOR) 40 MG tablet Take 1 tablet (40 mg total) by mouth daily.   cabergoline  (DOSTINEX ) 0.5 MG tablet Take 0.5 tablets (0.25 mg total) by mouth daily.   cabergoline  (DOSTINEX ) 0.5 MG tablet Take 0.5 tablets (0.25 mg total) by mouth daily.   fluorouracil  (EFUDEX ) 5 % cream apply to left superior forehead near hairline, midline mid scalp vertex, mid frontal scalp twice a day x 14 days (Patient not taking: Reported on 06/14/2023)   lansoprazole  (PREVACID ) 30 MG capsule Take 1  capsule (30 mg total) by mouth daily at 12:00 noon.   levothyroxine  (SYNTHROID ) 100 MCG tablet Take 1 tablet (100 mcg total) by mouth daily before breakfast.   metoprolol  tartrate (LOPRESSOR ) 25 MG tablet Take 1 tablet (25 mg total) by mouth 2 (two) times daily   Multiple Vitamin (MULTIVITAMIN) tablet Take 1 tablet by mouth daily.   promethazine -dextromethorphan (PROMETHAZINE -DM) 6.25-15 MG/5ML syrup Take 5 mLs by mouth 4 (four) times daily as needed.   [DISCONTINUED] benzonatate  (TESSALON  PERLES) 100 MG capsule Take 2 capsules (200 mg total) by mouth 3 (three) times daily as needed.   No facility-administered encounter medications on file as of 07/08/2023.     Lab Results  Component Value Date   WBC 6.9 06/14/2023   HGB 14.1 06/14/2023   HCT 41.5 06/14/2023   PLT 152.0 06/14/2023   GLUCOSE 99 07/05/2023   CHOL 165 07/05/2023   TRIG 186.0 (H) 07/05/2023   HDL 38.40 (L) 07/05/2023   LDLDIRECT 89.0 07/07/2022   LDLCALC 89 07/05/2023   ALT 49 (H) 07/05/2023   AST 35 07/05/2023   NA 144 07/05/2023   K 4.2 07/05/2023   CL 106 07/05/2023   CREATININE 0.96 07/05/2023   BUN 12 07/05/2023   CO2 30 07/05/2023   TSH 2.04 11/06/2022   HGBA1C 6.1 11/06/2022    MR BREAST BILATERAL W WO CONTRAST INC CAD Result Date: 06/29/2023 CLINICAL DATA:  High risk screening. Strong family history of breast cancer. No current symptoms. EXAM: BILATERAL BREAST MRI WITH AND  WITHOUT CONTRAST TECHNIQUE: Multiplanar, multisequence MR images of both breasts were obtained prior to and following the intravenous administration of 7.5 ml of Gadavist  Three-dimensional MR images were rendered by post-processing of the original MR data on an independent workstation. The three-dimensional MR images were interpreted, and findings are reported in the following complete MRI report for this study. Three dimensional images were evaluated at the independent interpreting workstation using the DynaCAD thin client. COMPARISON:  None  available. FINDINGS: Breast composition: b. Scattered fibroglandular tissue. Background parenchymal enhancement: Minimal Right breast: No suspicious enhancing mass or abnormal enhancement. Left breast: No suspicious enhancing mass or abnormal enhancement. Lymph nodes: No abnormal appearing lymph nodes. Ancillary findings:  None. IMPRESSION: No MRI evidence of malignancy in the bilateral breasts. RECOMMENDATION: 1.  Continue routine annual screening mammography. 2. The American Cancer Society recommends annual MRI and mammography in patients with an estimated lifetime risk of developing breast cancer greater than 20 - 25%, or who are known or suspected to be positive for the breast cancer gene. BI-RADS CATEGORY  1: Negative. Electronically Signed   By: Allena Ito M.D.   On: 06/29/2023 10:36       Assessment & Plan:  Health care maintenance Assessment & Plan: Physical today 07/08/23. PAP 06/18/21 - negative with negative HPV.   Colonoscopy 02/2017 - recommended f/u in 5 years.  (tubular adenoma).  Follow up colonoscopy 01/11/23. Diagnostic mammogram 07/07/21 - Birads I.  Had MRI breast 07/19/23 - ok.    Hypercholesteremia Assessment & Plan: On pravastatin .  Low cholesterol diet and exercise.  Follow lipid panel and liver function tests.  Lab Results  Component Value Date   CHOL 165 07/05/2023   HDL 38.40 (L) 07/05/2023   LDLCALC 89 07/05/2023   LDLDIRECT 89.0 07/07/2022   TRIG 186.0 (H) 07/05/2023   CHOLHDL 4 07/05/2023     Orders: -     Lipid panel; Future -     Hepatic function panel; Future -     Basic metabolic panel with GFR; Future  Hypothyroidism, unspecified type Assessment & Plan: On thyroid  replacement. Follow tsh.   Orders: -     TSH; Future  Acromegaly [facility]) Assessment & Plan: Continue f/u with endocrinology for treatment.    Community acquired pneumonia of right upper lobe of lung Assessment & Plan: Recently treated. Symptoms have improved. F/u cxr - no active  cardiopulmonary disease. Atelectaatic changes - left lung base. Lung fields otherwise clear. Still with persistent cough. Some cough with forced expiration. Treat with prednisone  taper as directed. Hold on further abx. Follow. Call with update.    Impacted cerumen of right ear Assessment & Plan: Bilateral cerumen present. Debrox. Call if desires ear irrigation    Fatty liver Assessment & Plan: Diet and exercise.  Has seen GI. Follow liver function tests.  ALT on recent check sligtly elevated. Follow.    Primary hypertension Assessment & Plan: Continue metoprolol  and triam/hctz.  Blood pressure as outlined. . Follow metabolic panel.  Follow pressures. No changes today in medication.    Hyperglycemia Assessment & Plan: Low carb diet and exercise. Follow met b and A1c.   Lab Results  Component Value Date   HGBA1C 6.1 11/06/2022      Other orders -     predniSONE ; 4 tablets (40 mg total) daily for 1 day, THEN 3.5 tablets (35 mg total) daily for 1 day, THEN 3 tablets (30 mg total) daily for 1 day, THEN 2.5 tablets (25 mg total) daily for 1  day, THEN 2 tablets (20 mg total) daily for 1 day, THEN 1.5 tablets (15 mg total) daily for 1 day, THEN 1 tablet (10 mg total) daily for 1 day, THEN 0.5 tablets (5 mg total) daily for 1 day.  Dispense: 18 tablet; Refill: 0     Dellar Fenton, MD

## 2023-07-08 NOTE — Patient Instructions (Signed)
 Saline nasal spray - flush nose daily  Nasacort nasal spray - 2 sprays each nostril one time per day.  Do this in the evening.

## 2023-07-08 NOTE — Assessment & Plan Note (Addendum)
 Physical today 07/08/23. PAP 06/18/21 - negative with negative HPV.   Colonoscopy 02/2017 - recommended f/u in 5 years.  (tubular adenoma).  Follow up colonoscopy 01/11/23. Diagnostic mammogram 07/07/21 - Birads I.  Had MRI breast 07/19/23 - ok.

## 2023-07-09 ENCOUNTER — Other Ambulatory Visit: Payer: Self-pay

## 2023-07-11 ENCOUNTER — Encounter: Payer: Self-pay | Admitting: Internal Medicine

## 2023-07-11 NOTE — Assessment & Plan Note (Signed)
 On thyroid replacement.  Follow tsh.

## 2023-07-11 NOTE — Assessment & Plan Note (Signed)
 Continue metoprolol  and triam/hctz.  Blood pressure as outlined. . Follow metabolic panel.  Follow pressures. No changes today in medication.

## 2023-07-11 NOTE — Assessment & Plan Note (Signed)
 Continue f/u with endocrinology for treatment.

## 2023-07-11 NOTE — Assessment & Plan Note (Signed)
 Low carb diet and exercise. Follow met b and A1c.   Lab Results  Component Value Date   HGBA1C 6.1 11/06/2022

## 2023-07-11 NOTE — Assessment & Plan Note (Signed)
 Bilateral cerumen present. Debrox. Call if desires ear irrigation

## 2023-07-11 NOTE — Assessment & Plan Note (Signed)
 Diet and exercise.  Has seen GI. Follow liver function tests.  ALT on recent check sligtly elevated. Follow.

## 2023-07-11 NOTE — Assessment & Plan Note (Signed)
 Recently treated. Symptoms have improved. F/u cxr - no active cardiopulmonary disease. Atelectaatic changes - left lung base. Lung fields otherwise clear. Still with persistent cough. Some cough with forced expiration. Treat with prednisone  taper as directed. Hold on further abx. Follow. Call with update.

## 2023-07-18 ENCOUNTER — Encounter: Payer: Self-pay | Admitting: Internal Medicine

## 2023-07-26 DIAGNOSIS — K080 Exfoliation of teeth due to systemic causes: Secondary | ICD-10-CM | POA: Diagnosis not present

## 2023-08-28 ENCOUNTER — Other Ambulatory Visit: Payer: Self-pay

## 2023-08-28 MED FILL — Atorvastatin Calcium Tab 40 MG (Base Equivalent): ORAL | 90 days supply | Qty: 90 | Fill #3 | Status: AC

## 2023-09-09 ENCOUNTER — Encounter: Payer: Self-pay | Admitting: Dermatology

## 2023-09-09 ENCOUNTER — Ambulatory Visit: Admitting: Dermatology

## 2023-09-09 DIAGNOSIS — Z86018 Personal history of other benign neoplasm: Secondary | ICD-10-CM

## 2023-09-09 DIAGNOSIS — L649 Androgenic alopecia, unspecified: Secondary | ICD-10-CM | POA: Diagnosis not present

## 2023-09-09 DIAGNOSIS — L578 Other skin changes due to chronic exposure to nonionizing radiation: Secondary | ICD-10-CM | POA: Diagnosis not present

## 2023-09-09 DIAGNOSIS — L820 Inflamed seborrheic keratosis: Secondary | ICD-10-CM | POA: Diagnosis not present

## 2023-09-09 DIAGNOSIS — Z79899 Other long term (current) drug therapy: Secondary | ICD-10-CM

## 2023-09-09 DIAGNOSIS — Z1283 Encounter for screening for malignant neoplasm of skin: Secondary | ICD-10-CM | POA: Diagnosis not present

## 2023-09-09 DIAGNOSIS — W908XXA Exposure to other nonionizing radiation, initial encounter: Secondary | ICD-10-CM | POA: Diagnosis not present

## 2023-09-09 DIAGNOSIS — Z85828 Personal history of other malignant neoplasm of skin: Secondary | ICD-10-CM

## 2023-09-09 DIAGNOSIS — D1801 Hemangioma of skin and subcutaneous tissue: Secondary | ICD-10-CM

## 2023-09-09 DIAGNOSIS — Z7189 Other specified counseling: Secondary | ICD-10-CM

## 2023-09-09 DIAGNOSIS — L570 Actinic keratosis: Secondary | ICD-10-CM

## 2023-09-09 DIAGNOSIS — Z8589 Personal history of malignant neoplasm of other organs and systems: Secondary | ICD-10-CM

## 2023-09-09 NOTE — Progress Notes (Signed)
 Follow-Up Visit   Subjective  Marie Colon is a 68 y.o. female who presents for the following: Skin Cancer Screening and Full Body Skin Exam hx of scc treated with Mohs, hx of bcc, hx of dysplastic nevi, hx of aks , hx of isks,  Reports spots on forehead, spots at legs, dry spot at breast area Would like to discuss hairloss medication  The patient presents for Total-Body Skin Exam (TBSE) for skin cancer screening and mole check. The patient has spots, moles and lesions to be evaluated, some may be new or changing and the patient may have concern these could be cancer.  The following portions of the chart were reviewed this encounter and updated as appropriate: medications, allergies, medical history  Review of Systems:  No other skin or systemic complaints except as noted in HPI or Assessment and Plan.  Objective  Well appearing patient in no apparent distress; mood and affect are within normal limits.  A full examination was performed including scalp, head, eyes, ears, nose, lips, neck, chest, axillae, abdomen, back, buttocks, bilateral upper extremities, bilateral lower extremities, hands, feet, fingers, toes, fingernails, and toenails. All findings within normal limits unless otherwise noted below.   Relevant physical exam findings are noted in the Assessment and Plan.  left pretibial x 1, right pretibial x 1 (2) Erythematous stuck-on, waxy papule or plaque midline superior forehead x 1, scalp x 2, (3) Erythematous thin papules/macules with gritty scale.   Assessment & Plan   ANDROGENETIC ALOPECIA (FEMALE PATTERN HAIR LOSS) Exam: Diffuse thinning of the crown and widening of the midline part with retention of the frontal hairline   Female Androgenic Alopecia is a chronic condition related to genetics and/or hormonal changes.  In women androgenetic alopecia is commonly associated with menopause but may occur any time after puberty.  It causes hair thinning primarily on the  crown with widening of the part and temporal hairline recession.  Can use OTC Rogaine (minoxidil) 5% solution/foam as directed.  Oral treatments in female patients who have no contraindication may include : - Low dose oral minoxidil 1.25 - 5mg  daily - Spironolactone 50 - 100mg  bid - Finasteride 2.5 - 5 mg daily Adjunctive therapies include: - Low Level Laser Light Therapy (LLLT) - Platelet-rich plasma injections (PRP) - Hair Transplants or scalp reduction    Treatment Plan: Patient interested in discussing hairl oss medication  No ankle swelling observed in visit  No history of heart problems   Discussed minoxidil  Advised if patient feels funny or any notices any significant swelling in ankles to stop medication   Patient will call or send mychart if interested in starting medication  If so will start minoxidil 2.5 tab take 1/2 tab by mouth daily 45 pills 3 rfs   Doses of oral minoxidil for hair loss are considered 'low dose'. This is because the doses used for hair loss are much lower than the doses which are used for conditions such as high blood pressure (hypertension). The doses used for hypertension are 10-40mg  per day.  Side effects are uncommon at the low doses (up to 2.5 mg/day) used to treat hair loss. Potential side effects, more commonly seen at higher doses, include: Increase in hair growth (hypertrichosis) elsewhere on face and body Temporary hair shedding upon starting medication which may last up to 4 weeks Ankle swelling, fluid retention, rapid weight gain more than 5 pounds Low blood pressure and feeling lightheaded or dizzy when standing up quickly Fast or irregular  heartbeat Headaches Long term medication management.  Patient is using long term (months to years) prescription medication  to control their dermatologic condition.  These medications require periodic monitoring to evaluate for efficacy and side effects and may require periodic laboratory  monitoring   SKIN CANCER SCREENING PERFORMED TODAY.  ACTINIC DAMAGE - Chronic condition, secondary to cumulative UV/sun exposure - diffuse scaly erythematous macules with underlying dyspigmentation - Recommend daily broad spectrum sunscreen SPF 30+ to sun-exposed areas, reapply every 2 hours as needed.  - Staying in the shade or wearing long sleeves, sun glasses (UVA+UVB protection) and wide brim hats (4-inch brim around the entire circumference of the hat) are also recommended for sun protection.  - Call for new or changing lesions.  LENTIGINES, SEBORRHEIC KERATOSES, HEMANGIOMAS - Benign normal skin lesions - Benign-appearing - Call for any changes  MELANOCYTIC NEVI - Tan-brown and/or pink-flesh-colored symmetric macules and papules - Benign appearing on exam today - Observation - Call clinic for new or changing moles - Recommend daily use of broad spectrum spf 30+ sunscreen to sun-exposed areas.   HISTORY OF BASAL CELL CARCINOMA OF THE SKIN 10/01/2022 Left forehead ED&C 10/20/2022 - No evidence of recurrence today - Recommend regular full body skin exams - Recommend daily broad spectrum sunscreen SPF 30+ to sun-exposed areas, reapply every 2 hours as needed.  - Call if any new or changing lesions are noted between office visits   HISTORY OF SQUAMOUS CELL CARCINOMA OF THE SKIN 07/20/2022 mid frontal scalp - Mohs 08/26/2022  discussed hair follicles were removed and hair will not grow back in the area - No evidence of recurrence today - No lymphadenopathy - Recommend regular full body skin exams - Recommend daily broad spectrum sunscreen SPF 30+ to sun-exposed areas, reapply every 2 hours as needed.  - Call if any new or changing lesions are noted between office visits   HISTORY OF DYSPLASTIC NEVUS 04/21/2016 left upper back mod to severe  No evidence of recurrence today Recommend regular full body skin exams Recommend daily broad spectrum sunscreen SPF 30+ to sun-exposed areas,  reapply every 2 hours as needed.  Call if any new or changing lesions are noted between office visits   INFLAMED SEBORRHEIC KERATOSIS (2) left pretibial x 1, right pretibial x 1 (2) Symptomatic, irritating, patient would like treated. Destruction of lesion - left pretibial x 1, right pretibial x 1 (2) Complexity: simple   Destruction method: cryotherapy   Informed consent: discussed and consent obtained   Timeout:  patient name, date of birth, surgical site, and procedure verified Lesion destroyed using liquid nitrogen: Yes   Region frozen until ice ball extended beyond lesion: Yes   Outcome: patient tolerated procedure well with no complications   Post-procedure details: wound care instructions given    ACTINIC KERATOSIS (3) midline superior forehead x 1, scalp x 2, (3) Actinic keratoses are precancerous spots that appear secondary to cumulative UV radiation exposure/sun exposure over time. They are chronic with expected duration over 1 year. A portion of actinic keratoses will progress to squamous cell carcinoma of the skin. It is not possible to reliably predict which spots will progress to skin cancer and so treatment is recommended to prevent development of skin cancer.  Recommend daily broad spectrum sunscreen SPF 30+ to sun-exposed areas, reapply every 2 hours as needed.  Recommend staying in the shade or wearing long sleeves, sun glasses (UVA+UVB protection) and wide brim hats (4-inch brim around the entire circumference of the hat). Call for  new or changing lesions. Destruction of lesion - midline superior forehead x 1, scalp x 2, (3) Complexity: simple   Destruction method: cryotherapy   Informed consent: discussed and consent obtained   Timeout:  patient name, date of birth, surgical site, and procedure verified Lesion destroyed using liquid nitrogen: Yes   Region frozen until ice ball extended beyond lesion: Yes   Outcome: patient tolerated procedure well with no  complications   Post-procedure details: wound care instructions given    Return in about 6 months (around 03/11/2024) for ubse hx of sccis hx of bcc.  IEleanor Blush, CMA, am acting as scribe for Alm Rhyme, MD.   Documentation: I have reviewed the above documentation for accuracy and completeness, and I agree with the above.  Alm Rhyme, MD

## 2023-09-09 NOTE — Patient Instructions (Addendum)
 Doses of oral minoxidil for hair loss are considered 'low dose'. This is because the doses used for hair loss are much lower than the doses which are used for conditions such as high blood pressure (hypertension). The doses used for hypertension are 10-40mg  per day.  Side effects are uncommon at the low doses (up to 2.5 mg/day) used to treat hair loss. Potential side effects, more commonly seen at higher doses, include: Increase in hair growth (hypertrichosis) elsewhere on face and body Temporary hair shedding upon starting medication which may last up to 4 weeks Ankle swelling, fluid retention, rapid weight gain more than 5 pounds Low blood pressure and feeling lightheaded or dizzy when standing up quickly Fast or irregular heartbeat Headaches      Recommend daily broad spectrum sunscreen SPF 30+ to sun-exposed areas, reapply every 2 hours as needed. Call for new or changing lesions.  Staying in the shade or wearing long sleeves, sun glasses (UVA+UVB protection) and wide brim hats (4-inch brim around the entire circumference of the hat) are also recommended for sun protection.      Seborrheic Keratosis  What causes seborrheic keratoses? Seborrheic keratoses are harmless, common skin growths that first appear during adult life.  As time goes by, more growths appear.  Some people may develop a large number of them.  Seborrheic keratoses appear on both covered and uncovered body parts.  They are not caused by sunlight.  The tendency to develop seborrheic keratoses can be inherited.  They vary in color from skin-colored to gray, brown, or even black.  They can be either smooth or have a rough, warty surface.   Seborrheic keratoses are superficial and look as if they were stuck on the skin.  Under the microscope this type of keratosis looks like layers upon layers of skin.  That is why at times the top layer may seem to fall off, but the rest of the growth remains and re-grows.     Treatment Seborrheic keratoses do not need to be treated, but can easily be removed in the office.  Seborrheic keratoses often cause symptoms when they rub on clothing or jewelry.  Lesions can be in the way of shaving.  If they become inflamed, they can cause itching, soreness, or burning.  Removal of a seborrheic keratosis can be accomplished by freezing, burning, or surgery. If any spot bleeds, scabs, or grows rapidly, please return to have it checked, as these can be an indication of a skin cancer.  Cryotherapy Aftercare  Wash gently with soap and water everyday.   Apply Vaseline and Band-Aid daily until healed.   Actinic keratoses are precancerous spots that appear secondary to cumulative UV radiation exposure/sun exposure over time. They are chronic with expected duration over 1 year. A portion of actinic keratoses will progress to squamous cell carcinoma of the skin. It is not possible to reliably predict which spots will progress to skin cancer and so treatment is recommended to prevent development of skin cancer.  Recommend daily broad spectrum sunscreen SPF 30+ to sun-exposed areas, reapply every 2 hours as needed.  Recommend staying in the shade or wearing long sleeves, sun glasses (UVA+UVB protection) and wide brim hats (4-inch brim around the entire circumference of the hat). Call for new or changing lesions.     Melanoma ABCDEs  Melanoma is the most dangerous type of skin cancer, and is the leading cause of death from skin disease.  You are more likely to develop melanoma if you: Have  light-colored skin, light-colored eyes, or red or blond hair Spend a lot of time in the sun Tan regularly, either outdoors or in a tanning bed Have had blistering sunburns, especially during childhood Have a close family member who has had a melanoma Have atypical moles or large birthmarks  Early detection of melanoma is key since treatment is typically straightforward and cure rates are  extremely high if we catch it early.   The first sign of melanoma is often a change in a mole or a new dark spot.  The ABCDE system is a way of remembering the signs of melanoma.  A for asymmetry:  The two halves do not match. B for border:  The edges of the growth are irregular. C for color:  A mixture of colors are present instead of an even brown color. D for diameter:  Melanomas are usually (but not always) greater than 6mm - the size of a pencil eraser. E for evolution:  The spot keeps changing in size, shape, and color.  Please check your skin once per month between visits. You can use a small mirror in front and a large mirror behind you to keep an eye on the back side or your body.   If you see any new or changing lesions before your next follow-up, please call to schedule a visit.  Please continue daily skin protection including broad spectrum sunscreen SPF 30+ to sun-exposed areas, reapplying every 2 hours as needed when you're outdoors.   Staying in the shade or wearing long sleeves, sun glasses (UVA+UVB protection) and wide brim hats (4-inch brim around the entire circumference of the hat) are also recommended for sun protection.    Due to recent changes in healthcare laws, you may see results of your pathology and/or laboratory studies on MyChart before the doctors have had a chance to review them. We understand that in some cases there may be results that are confusing or concerning to you. Please understand that not all results are received at the same time and often the doctors may need to interpret multiple results in order to provide you with the best plan of care or course of treatment. Therefore, we ask that you please give us  2 business days to thoroughly review all your results before contacting the office for clarification. Should we see a critical lab result, you will be contacted sooner.   If You Need Anything After Your Visit  If you have any questions or concerns for  your doctor, please call our main line at [phone] and press option 4 to reach your doctor's medical assistant. If no one answers, please leave a voicemail as directed and we will return your call as soon as possible. Messages left after 4 pm will be answered the following business day.   You may also send us  a message via MyChart. We typically respond to MyChart messages within 1-2 business days.  For prescription refills, please ask your pharmacy to contact our office. Our fax number is [phone].  If you have an urgent issue when the clinic is closed that cannot wait until the next business day, you can page your doctor at the number below.    Please note that while we do our best to be available for urgent issues outside of office hours, we are not available 24/7.   If you have an urgent issue and are unable to reach us , you may choose to seek medical care at your doctor's office, retail clinic, urgent  care center, or emergency room.  If you have a medical emergency, please immediately call 911 or go to the emergency department.  Pager Numbers  - Dr. Hester: [phone]  - Dr. Jackquline: [phone]  - Dr. Claudene: [phone]   In the event of inclement weather, please call our main line at [phone] for an update on the status of any delays or closures.  Dermatology Medication Tips: Please keep the boxes that topical medications come in in order to help keep track of the instructions about where and how to use these. Pharmacies typically print the medication instructions only on the boxes and not directly on the medication tubes.   If your medication is too expensive, please contact our office at [phone] option 4 or send us  a message through MyChart.   We are unable to tell what your co-pay for medications will be in advance as this is different depending on your insurance coverage. However, we may be able to find a substitute medication at lower cost or fill out  paperwork to get insurance to cover a needed medication.   If a prior authorization is required to get your medication covered by your insurance company, please allow us  1-2 business days to complete this process.  Drug prices often vary depending on where the prescription is filled and some pharmacies may offer cheaper prices.  The website www.goodrx.com contains coupons for medications through different pharmacies. The prices here do not account for what the cost may be with help from insurance (it may be cheaper with your insurance), but the website can give you the price if you did not use any insurance.  - You can print the associated coupon and take it with your prescription to the pharmacy.  - You may also stop by our office during regular business hours and pick up a GoodRx coupon card.  - If you need your prescription sent electronically to a different pharmacy, notify our office through [facility] or by phone at [phone] option 4.     Si Usted Necesita Algo Despus de Su Visita  Tambin puede enviarnos un mensaje a travs de Clinical cytogeneticist. Por lo general respondemos a los mensajes de MyChart en el transcurso de 1 a 2 das hbiles.  Para renovar recetas, por favor pida a su farmacia que se ponga en contacto con nuestra oficina. Randi lakes de fax es [place] [phone].  Si tiene un asunto urgente cuando la clnica est cerrada y que no puede esperar hasta el siguiente da hbil, puede llamar/localizar a su doctor(a) al nmero que aparece a continuacin.   Por favor, tenga en cuenta que aunque hacemos todo lo posible para estar disponibles para asuntos urgentes fuera del horario de [place], no estamos disponibles las 24 horas del da, los 7 [address]  [address] de la [place].   Si tiene un problema urgente y no puede comunicarse con nosotros, puede optar por buscar atencin mdica  en el consultorio de su doctor(a), en una clnica privada, en un centro de atencin urgente o en una sala de  emergencias.  Si tiene Engineer, drilling, por favor llame inmediatamente al 911 o vaya a la sala de emergencias.  Nmeros de bper  - Dr. Hester: [phone]  - Dra. Jackquline: [phone]  - Dr. Claudene: [phone]   En caso de inclemencias del tiempo, por favor llame a landry capes principal al [phone] para una actualizacin sobre el [place] de cualquier retraso o cierre.  Consejos para la medicacin en dermatologa: Por favor, guarde las  cajas en las que vienen los medicamentos de uso tpico para ayudarle a seguir las instrucciones sobre dnde y cmo usarlos. Las farmacias generalmente imprimen las instrucciones del medicamento slo en las cajas y no directamente en los tubos del [place].   Si su medicamento es muy caro, por favor, pngase en contacto con landry rieger llamando al [phone] y presione la opcin 4 o envenos un mensaje a travs de Clinical cytogeneticist.   No podemos decirle cul ser su copago por los medicamentos por adelantado ya que esto es diferente dependiendo de la cobertura de su seguro. Sin embargo, es posible que podamos encontrar un medicamento sustituto a Audiological scientist un formulario para que el seguro cubra el medicamento que se considera necesario.   Si se requiere una autorizacin previa para que su compaa de seguros malta su medicamento, por favor permtanos de 1 a 2 das hbiles para completar este proceso.  Los precios de los medicamentos varan con frecuencia dependiendo del Environmental consultant de dnde se surte la receta y alguna farmacias pueden ofrecer precios ms baratos.  El sitio web www.goodrx.com tiene cupones para medicamentos de Health and safety inspector. Los precios aqu no tienen en cuenta lo que podra costar con la ayuda del seguro (puede ser ms barato con su seguro), pero el sitio web puede darle el precio si no utiliz Tourist information centre manager.  - Puede imprimir el cupn correspondiente y llevarlo con su receta a la farmacia.  - Tambin puede pasar por  nuestra oficina durante el horario de atencin regular y Education officer, museum una tarjeta de cupones de GoodRx.  - Si necesita que su receta se enve electrnicamente a una farmacia diferente, informe a nuestra oficina a travs de MyChart de [place] o por telfono llamando al [phone] y presione la opcin 4.

## 2023-10-03 ENCOUNTER — Other Ambulatory Visit: Payer: Self-pay | Admitting: Internal Medicine

## 2023-10-03 MED FILL — Metoprolol Tartrate Tab 25 MG: ORAL | 90 days supply | Qty: 180 | Fill #3 | Status: AC

## 2023-10-04 ENCOUNTER — Other Ambulatory Visit: Payer: Self-pay | Admitting: Internal Medicine

## 2023-10-04 ENCOUNTER — Other Ambulatory Visit: Payer: Self-pay

## 2023-10-04 MED FILL — Allopurinol Tab 100 MG: ORAL | 90 days supply | Qty: 90 | Fill #0 | Status: AC

## 2023-11-05 ENCOUNTER — Other Ambulatory Visit: Payer: Self-pay

## 2023-11-12 ENCOUNTER — Encounter: Payer: Self-pay | Admitting: Internal Medicine

## 2023-11-12 ENCOUNTER — Other Ambulatory Visit: Payer: Self-pay

## 2023-11-12 MED FILL — Metoprolol Tartrate Tab 25 MG: ORAL | 90 days supply | Qty: 180 | Fill #4 | Status: CN

## 2023-11-12 MED FILL — Allopurinol Tab 100 MG: ORAL | 90 days supply | Qty: 90 | Fill #1 | Status: CN

## 2023-11-12 MED FILL — Atorvastatin Calcium Tab 40 MG (Base Equivalent): ORAL | 90 days supply | Qty: 90 | Fill #4 | Status: CN

## 2023-11-15 ENCOUNTER — Other Ambulatory Visit (INDEPENDENT_AMBULATORY_CARE_PROVIDER_SITE_OTHER)

## 2023-11-15 DIAGNOSIS — E039 Hypothyroidism, unspecified: Secondary | ICD-10-CM | POA: Diagnosis not present

## 2023-11-15 DIAGNOSIS — E7800 Pure hypercholesterolemia, unspecified: Secondary | ICD-10-CM

## 2023-11-15 DIAGNOSIS — E220 Acromegaly and pituitary gigantism: Secondary | ICD-10-CM | POA: Diagnosis not present

## 2023-11-15 LAB — BASIC METABOLIC PANEL WITH GFR
BUN: 12 mg/dL (ref 6–23)
CO2: 28 meq/L (ref 19–32)
Calcium: 9.3 mg/dL (ref 8.4–10.5)
Chloride: 105 meq/L (ref 96–112)
Creatinine, Ser: 0.99 mg/dL (ref 0.40–1.20)
GFR: 58.64 mL/min — ABNORMAL LOW (ref >60.00)
Glucose, Bld: 101 mg/dL — ABNORMAL HIGH (ref 70–99)
Potassium: 4 meq/L (ref 3.5–5.1)
Sodium: 145 meq/L (ref 135–145)

## 2023-11-15 LAB — HEPATIC FUNCTION PANEL
ALT: 51 U/L — ABNORMAL HIGH (ref 0–35)
AST: 35 U/L (ref 0–37)
Albumin: 4.3 g/dL (ref 3.5–5.2)
Alkaline Phosphatase: 88 U/L (ref 39–117)
Bilirubin, Direct: 0.1 mg/dL (ref 0.0–0.3)
Total Bilirubin: 0.5 mg/dL (ref 0.2–1.2)
Total Protein: 6.2 g/dL (ref 6.0–8.3)

## 2023-11-15 LAB — LIPID PANEL
Cholesterol: 151 mg/dL (ref 0–200)
HDL: 37.5 mg/dL — ABNORMAL LOW (ref >39.00)
LDL Cholesterol: 84 mg/dL (ref 0–99)
NonHDL: 113.82
Total CHOL/HDL Ratio: 4
Triglycerides: 150 mg/dL — ABNORMAL HIGH (ref 0.0–149.0)
VLDL: 30 mg/dL (ref 0.0–40.0)

## 2023-11-15 LAB — TSH: TSH: 2.73 u[IU]/mL (ref 0.35–5.50)

## 2023-11-16 ENCOUNTER — Ambulatory Visit: Payer: Self-pay | Admitting: Internal Medicine

## 2023-11-16 MED FILL — Atorvastatin Calcium Tab 40 MG (Base Equivalent): ORAL | 90 days supply | Qty: 90 | Fill #4 | Status: AC

## 2023-11-18 ENCOUNTER — Other Ambulatory Visit: Payer: Self-pay

## 2023-11-18 ENCOUNTER — Encounter: Payer: Self-pay | Admitting: Internal Medicine

## 2023-11-18 ENCOUNTER — Ambulatory Visit: Admitting: Internal Medicine

## 2023-11-18 VITALS — BP 100/74 | HR 63 | Temp 98.5°F | Ht 61.0 in | Wt 177.6 lb

## 2023-11-18 DIAGNOSIS — E039 Hypothyroidism, unspecified: Secondary | ICD-10-CM | POA: Diagnosis not present

## 2023-11-18 DIAGNOSIS — E7800 Pure hypercholesterolemia, unspecified: Secondary | ICD-10-CM

## 2023-11-18 DIAGNOSIS — R739 Hyperglycemia, unspecified: Secondary | ICD-10-CM

## 2023-11-18 DIAGNOSIS — E220 Acromegaly and pituitary gigantism: Secondary | ICD-10-CM

## 2023-11-18 DIAGNOSIS — Z83719 Family history of colon polyps, unspecified: Secondary | ICD-10-CM

## 2023-11-18 DIAGNOSIS — Z23 Encounter for immunization: Secondary | ICD-10-CM | POA: Diagnosis not present

## 2023-11-18 DIAGNOSIS — D352 Benign neoplasm of pituitary gland: Secondary | ICD-10-CM

## 2023-11-18 DIAGNOSIS — I10 Essential (primary) hypertension: Secondary | ICD-10-CM

## 2023-11-18 DIAGNOSIS — K760 Fatty (change of) liver, not elsewhere classified: Secondary | ICD-10-CM

## 2023-11-18 MED ORDER — TRIAMCINOLONE ACETONIDE 0.1 % EX CREA
1.0000 | TOPICAL_CREAM | Freq: Two times a day (BID) | CUTANEOUS | 0 refills | Status: AC
  Filled 2023-11-18: qty 30, 15d supply, fill #0

## 2023-11-18 NOTE — Progress Notes (Signed)
 Subjective:    Patient ID: Marie Colon, female    DOB: [birth year], 68 y.o.   MRN: [id number]  Patient here for  Chief Complaint  Patient presents with   Hypothyroidism   Hypertension    HPI Here for a scheduled follow up - follow up regarding hypertension, GERD and hypercholesterolemia. Breathing stable. No cough or congestion. Cramping - am. Intermittent flares. May occur a couple of times per week.    Past Medical History:  Diagnosis Date   Acromegaly (HCC)    Actinic keratosis    Angiomyolipoma of kidney    evaluated by Dr Twylla   Basal cell carcinoma 10/01/2022   L forehead, EDC 10/20/22   Fatty liver    GERD (gastroesophageal reflux disease)    Heart murmur    per Dr. Allena Hamilton   History of pyelonephritis    History of ulcerative colitis    Hx of dysplastic nevus 04/21/2016   L upper back, moderate to severe atypia   Hypercholesterolemia    Hypertension    Hypothyroidism    Pituitary macroadenoma (HCC)    s/p removal (elevated growth hormone)   squamous cell carcinoma of skin 07/20/2022   mid frontal scalp, MOHs 08/26/22   Tubal pregnancy    s/p rupture   Wears glasses    Past Surgical History:  Procedure Laterality Date   ABDOMINAL HYSTERECTOMY  2003   abnormal uterine bleeding   APPENDECTOMY     cancer lesion removed from head  05/2022   CHOLECYSTECTOMY  2005   COLONOSCOPY WITH PROPOFOL  N/A 03/18/2017   Procedure: COLONOSCOPY WITH PROPOFOL ;  Surgeon: Gaylyn Gladis PENNER, MD;  Location: [facility] ENDOSCOPY;  Service: Endoscopy;  Laterality: N/A;   DILATION AND CURETTAGE OF UTERUS     MASS EXCISION Right 08/29/2015   Procedure: EXCISION OF RIGHT THIGH MASS SUBFASICAL 15 CM;  Surgeon: Jina Nephew, MD;  Location: [facility] OR;  Service: General;  Laterality: Right;   pituitary tumor removal  2006   Family History  Problem Relation Age of Onset   Brain cancer Father        died - 79   Diabetes Mother    Hypothyroidism Sister    Breast cancer Sister 44    Sjogren's syndrome Sister    Breast cancer Sister 51   Diabetes Other        grandmother   Social History   Socioeconomic History   Marital status: Married    Spouse name: Not on file   Number of children: 2   Years of education: Not on file   Highest education level: 12th grade  Occupational History    Employer: [employer]    Comment: [employer] - [employer]  Tobacco Use   Smoking status: Never   Smokeless tobacco: Never  Vaping Use   Vaping status: Never Used  Substance and Sexual Activity   Alcohol use: No    Alcohol/week: 0.0 standard drinks of alcohol   Drug use: No   Sexual activity: Not on file  Other Topics Concern   Not on file  Social History Narrative   Married   Social Drivers of Health   Financial Resource Strain: Low Risk  (11/16/2023)   Overall Financial Resource Strain (CARDIA)    Difficulty of Paying Living Expenses: Not hard at all  Food Insecurity: No Food Insecurity (11/16/2023)   Hunger Vital Sign    Worried About Running Out of Food in the Last Year: Never true    Ran Out of  Food in the Last Year: Never true  Transportation Needs: No Transportation Needs (11/16/2023)   PRAPARE - Administrator, Civil Service (Medical): No    Lack of Transportation (Non-Medical): No  Physical Activity: Insufficiently Active (11/16/2023)   Exercise Vital Sign    Days of Exercise per Week: 2 days    Minutes of Exercise per Session: 20 min  Stress: No Stress Concern Present (11/16/2023)   [employer] of Occupational Health - Occupational Stress Questionnaire    Feeling of Stress: Only a little  Social Connections: Socially Integrated (11/16/2023)   Social Connection and Isolation Panel    Frequency of Communication with Friends and Family: More than three times a week    Frequency of Social Gatherings with Friends and Family: Twice a week    Attends Religious Services: More than 4 times per year    Active Member of [employer] or Organizations: Yes    Attends  Banker Meetings: 1 to 4 times per year    Marital Status: Married     Review of Systems  Constitutional:  Negative for appetite change and unexpected weight change.  HENT:  Negative for congestion and sinus pressure.   Respiratory:  Negative for cough, chest tightness and shortness of breath.   Cardiovascular:  Negative for chest pain, palpitations and leg swelling.  Gastrointestinal:  Negative for nausea and vomiting.  Genitourinary:  Negative for difficulty urinating and dysuria.  Musculoskeletal:  Negative for joint swelling and myalgias.  Skin:  Negative for color change and rash.  Neurological:  Negative for dizziness and headaches.  Psychiatric/Behavioral:  Negative for agitation and dysphoric mood.        Objective:     BP 100/74 (Cuff Size: Normal)   Pulse 63   Temp 98.5 F (36.9 C) (Oral)   Ht 5' 1 (1.549 m)   Wt 177 lb 9.6 oz (80.6 kg)   SpO2 97%   BMI 33.56 kg/m  Wt Readings from Last 3 Encounters:  11/18/23 177 lb 9.6 oz (80.6 kg)  07/08/23 176 lb 3.2 oz (79.9 kg)  06/14/23 172 lb 2 oz (78.1 kg)    Physical Exam Vitals reviewed.  Constitutional:      General: She is not in acute distress.    Appearance: Normal appearance.  HENT:     Head: Normocephalic and atraumatic.     Right Ear: External ear normal.     Left Ear: External ear normal.     Mouth/Throat:     Pharynx: No oropharyngeal exudate or posterior oropharyngeal erythema.  Eyes:     General: No scleral icterus.       Right eye: No discharge.        Left eye: No discharge.     Conjunctiva/sclera: Conjunctivae normal.  Neck:     Thyroid : No thyromegaly.  Cardiovascular:     Rate and Rhythm: Normal rate and regular rhythm.  Pulmonary:     Effort: No respiratory distress.     Breath sounds: Normal breath sounds. No wheezing.  Abdominal:     General: Bowel sounds are normal.     Palpations: Abdomen is soft.     Tenderness: There is no abdominal tenderness.  Musculoskeletal:         General: No swelling or tenderness.     Cervical back: Neck supple. No tenderness.  Lymphadenopathy:     Cervical: No cervical adenopathy.  Skin:    Findings: No erythema or rash.  Neurological:  Mental Status: She is alert.  Psychiatric:        Mood and Affect: Mood normal.        Behavior: Behavior normal.         Outpatient Encounter Medications as of 11/18/2023  Medication Sig   allopurinol  (ZYLOPRIM ) 100 MG tablet Take 1 tablet (100 mg total) by mouth daily.   aspirin EC 81 MG tablet Take 81 mg by mouth daily.   atorvastatin  (LIPITOR) 40 MG tablet Take 1 tablet (40 mg total) by mouth daily.   cabergoline  (DOSTINEX ) 0.5 MG tablet Take 0.5 tablets (0.25 mg total) by mouth daily.   lansoprazole  (PREVACID ) 30 MG capsule Take 1 capsule (30 mg total) by mouth daily at 12:00 noon.   levothyroxine  (SYNTHROID ) 100 MCG tablet Take 1 tablet (100 mcg total) by mouth daily before breakfast.   metoprolol  tartrate (LOPRESSOR ) 25 MG tablet Take 1 tablet (25 mg total) by mouth 2 (two) times daily   Multiple Vitamin (MULTIVITAMIN) tablet Take 1 tablet by mouth daily.   triamcinolone  cream (KENALOG ) 0.1 % Apply to affected area 2 times daily. Do not use more than 7-10 days in the same place   [DISCONTINUED] albuterol  (VENTOLIN  HFA) 108 (90 Base) MCG/ACT inhaler Inhale 2 puffs into the lungs every 6 (six) hours as needed for wheezing or shortness of breath.   [DISCONTINUED] cabergoline  (DOSTINEX ) 0.5 MG tablet Take 0.5 tablets (0.25 mg total) by mouth daily.   [DISCONTINUED] fluorouracil  (EFUDEX ) 5 % cream apply to left superior forehead near hairline, midline mid scalp vertex, mid frontal scalp twice a day x 14 days (Patient not taking: Reported on 09/09/2023)   [DISCONTINUED] promethazine -dextromethorphan (PROMETHAZINE -DM) 6.25-15 MG/5ML syrup Take 5 mLs by mouth 4 (four) times daily as needed.   No facility-administered encounter medications on file as of 11/18/2023.     Lab Results   Component Value Date   WBC 6.9 06/14/2023   HGB 14.1 06/14/2023   HCT 41.5 06/14/2023   PLT 152.0 06/14/2023   GLUCOSE 101 (H) 11/15/2023   CHOL 151 11/15/2023   TRIG 150.0 (H) 11/15/2023   HDL 37.50 (L) 11/15/2023   LDLDIRECT 89.0 07/07/2022   LDLCALC 84 11/15/2023   ALT 51 (H) 11/15/2023   AST 35 11/15/2023   NA 145 11/15/2023   K 4.0 11/15/2023   CL 105 11/15/2023   CREATININE 0.99 11/15/2023   BUN 12 11/15/2023   CO2 28 11/15/2023   TSH 2.73 11/15/2023   HGBA1C 6.1 11/06/2022    MR BREAST BILATERAL W WO CONTRAST INC CAD Result Date: 06/29/2023 CLINICAL DATA:  High risk screening. Strong family history of breast cancer. No current symptoms. EXAM: BILATERAL BREAST MRI WITH AND WITHOUT CONTRAST TECHNIQUE: Multiplanar, multisequence MR images of both breasts were obtained prior to and following the intravenous administration of 7.5 ml of Gadavist  Three-dimensional MR images were rendered by post-processing of the original MR data on an independent workstation. The three-dimensional MR images were interpreted, and findings are reported in the following complete MRI report for this study. Three dimensional images were evaluated at the independent interpreting workstation using the DynaCAD thin client. COMPARISON:  None available. FINDINGS: Breast composition: b. Scattered fibroglandular tissue. Background parenchymal enhancement: Minimal Right breast: No suspicious enhancing mass or abnormal enhancement. Left breast: No suspicious enhancing mass or abnormal enhancement. Lymph nodes: No abnormal appearing lymph nodes. Ancillary findings:  None. IMPRESSION: No MRI evidence of malignancy in the bilateral breasts. RECOMMENDATION: 1.  Continue routine annual screening mammography. 2. The American  Cancer Society recommends annual MRI and mammography in patients with an estimated lifetime risk of developing breast cancer greater than 20 - 25%, or who are known or suspected to be positive for the  breast cancer gene. BI-RADS CATEGORY  1: Negative. Electronically Signed   By: Inocente Ast M.D.   On: 06/29/2023 10:36       Assessment & Plan:  Hypercholesteremia Assessment & Plan: On pravastatin .  Low cholesterol diet and exercise.  Follow lipid panel.  Lab Results  Component Value Date   CHOL 151 11/15/2023   HDL 37.50 (L) 11/15/2023   LDLCALC 84 11/15/2023   LDLDIRECT 89.0 07/07/2022   TRIG 150.0 (H) 11/15/2023   CHOLHDL 4 11/15/2023     Orders: -     Lipid panel; Future -     Hepatic function panel; Future  Hyperglycemia Assessment & Plan: Low carb diet and exercise. Follow met b and A1c.   Lab Results  Component Value Date   HGBA1C 6.1 11/06/2022     Orders: -     Hemoglobin A1c; Future  Primary hypertension Assessment & Plan: Continue metoprolol  and triam/hctz.  Blood pressure as outlined. . Follow metabolic panel.  Follow pressures. No change in medication today.    Orders: -     Basic metabolic panel with GFR; Future  Hypothyroidism, unspecified type Assessment & Plan: On thyroid  replacement. Follow tsh.    Needs flu shot -     Flu vaccine HIGH DOSE PF(Fluzone Trivalent)  Family history of colonic polyps Assessment & Plan: Colonoscopy 12/2022 - internal hemorrhoids. Recommended f/u in 10 years.    Acromegaly [facility]) Assessment & Plan: Continue f/u with endocrinology. Continues cabergoline .    Fatty liver Assessment & Plan: Diet and exercise.  Has seen GI. Follow liver function tests.  ALT on recent check sligtly elevated. Intermittent cramping as outlined Confirm f/u with GI.    Pituitary macroadenoma (HCC) Assessment & Plan: Previous - elevated GH - acromegaly.  Followed by Dr Damian.  On cabergoline .    Other orders -     Triamcinolone  Acetonide; Apply to affected area 2 times daily. Do not use more than 7-10 days in the same place  Dispense: 30 g; Refill: 0     Allena Hamilton, MD

## 2023-11-22 ENCOUNTER — Encounter: Payer: Self-pay | Admitting: Internal Medicine

## 2023-11-22 DIAGNOSIS — Z1331 Encounter for screening for depression: Secondary | ICD-10-CM | POA: Diagnosis not present

## 2023-11-22 DIAGNOSIS — Z87898 Personal history of other specified conditions: Secondary | ICD-10-CM | POA: Diagnosis not present

## 2023-11-22 DIAGNOSIS — E220 Acromegaly and pituitary gigantism: Secondary | ICD-10-CM | POA: Diagnosis not present

## 2023-11-22 NOTE — Assessment & Plan Note (Signed)
 On pravastatin .  Low cholesterol diet and exercise.  Follow lipid panel.  Lab Results  Component Value Date   CHOL 151 11/15/2023   HDL 37.50 (L) 11/15/2023   LDLCALC 84 11/15/2023   LDLDIRECT 89.0 07/07/2022   TRIG 150.0 (H) 11/15/2023   CHOLHDL 4 11/15/2023

## 2023-11-22 NOTE — Assessment & Plan Note (Signed)
 Previous - elevated GH - acromegaly.  Followed by Dr Lorelei Rogers.  On cabergoline .

## 2023-11-22 NOTE — Assessment & Plan Note (Signed)
 Low carb diet and exercise. Follow met b and A1c.   Lab Results  Component Value Date   HGBA1C 6.1 11/06/2022

## 2023-11-22 NOTE — Assessment & Plan Note (Signed)
 Colonoscopy 12/2022 - internal hemorrhoids. Recommended f/u in 10 years.

## 2023-11-22 NOTE — Assessment & Plan Note (Signed)
 On thyroid replacement.  Follow tsh.

## 2023-11-22 NOTE — Assessment & Plan Note (Signed)
 Continue f/u with endocrinology. Continues cabergoline .

## 2023-11-22 NOTE — Assessment & Plan Note (Addendum)
 Diet and exercise.  Has seen GI. Follow liver function tests.  ALT on recent check sligtly elevated. Intermittent cramping as outlined Confirm f/u with GI.

## 2023-11-22 NOTE — Assessment & Plan Note (Signed)
 Continue metoprolol  and triam/hctz.  Blood pressure as outlined. . Follow metabolic panel.  Follow pressures. No change in medication today.

## 2023-12-06 ENCOUNTER — Encounter: Payer: Self-pay | Admitting: Internal Medicine

## 2023-12-26 ENCOUNTER — Other Ambulatory Visit: Payer: Self-pay

## 2023-12-26 MED FILL — Metoprolol Tartrate Tab 25 MG: ORAL | 90 days supply | Qty: 180 | Fill #4 | Status: AC

## 2023-12-26 MED FILL — Allopurinol Tab 100 MG: ORAL | 90 days supply | Qty: 90 | Fill #1 | Status: AC

## 2023-12-27 ENCOUNTER — Ambulatory Visit: Payer: Self-pay

## 2023-12-27 ENCOUNTER — Ambulatory Visit: Payer: Self-pay | Admitting: Internal Medicine

## 2023-12-27 ENCOUNTER — Encounter: Payer: Self-pay | Admitting: Internal Medicine

## 2023-12-27 ENCOUNTER — Ambulatory Visit

## 2023-12-27 ENCOUNTER — Other Ambulatory Visit: Payer: Self-pay

## 2023-12-27 ENCOUNTER — Ambulatory Visit: Admitting: Internal Medicine

## 2023-12-27 VITALS — BP 124/82 | HR 81 | Temp 98.6°F | Ht 61.0 in | Wt 175.4 lb

## 2023-12-27 DIAGNOSIS — R1031 Right lower quadrant pain: Secondary | ICD-10-CM

## 2023-12-27 DIAGNOSIS — I10 Essential (primary) hypertension: Secondary | ICD-10-CM

## 2023-12-27 DIAGNOSIS — R2242 Localized swelling, mass and lump, left lower limb: Secondary | ICD-10-CM | POA: Diagnosis not present

## 2023-12-27 MED ORDER — IBUPROFEN 400 MG PO TABS
400.0000 mg | ORAL_TABLET | Freq: Three times a day (TID) | ORAL | 1 refills | Status: AC
  Filled 2023-12-27: qty 15, 5d supply, fill #0

## 2023-12-27 MED ORDER — METHOCARBAMOL 500 MG PO TABS
500.0000 mg | ORAL_TABLET | Freq: Three times a day (TID) | ORAL | 0 refills | Status: AC | PRN
  Filled 2023-12-27: qty 30, 10d supply, fill #0

## 2023-12-27 NOTE — Telephone Encounter (Signed)
 Reviewed chart. Has appt now  - work in appt

## 2023-12-27 NOTE — Assessment & Plan Note (Signed)
-   Patient with initially mild pain in the right groin over the last month as she has had to go into the attic to take out decorations and takes lots out of In preparation for Thanksgiving -Patient states the pain acutely worsened on Friday after waking up.  She states that she was very active on Thursday with preparing and cleaning up after Thanksgiving meal -Denies any fevers or chills -On exam, patient has tenderness to palpation at the midpoint of the right inguinal fold -She also has some mild tenderness to palpation of her right trochanter -I suspect patient likely has a muscle strain from her increased physical activity as well as possibly mild trochanteric bursitis -I will treat the patient with Robaxin to use 3 times a day as needed as well as scheduled ibuprofen 400 mg 3 times a day for 5 days to see if this will help with her symptoms -Will also obtain an x-ray of her right femur to rule any bony pathology but this is less likely -Will consider referring to physical therapy if she does not improve -Return precautions given to the patient -Patient also advised caution with using muscle relaxant - No further workup at this time

## 2023-12-27 NOTE — Assessment & Plan Note (Signed)
-   This problem is chronic and stable -Blood pressure today is 124/82 -We will continue current medication regimen including metoprolol  25 mg twice daily  - No further workup at this time

## 2023-12-27 NOTE — Progress Notes (Signed)
 Acute Office Visit  Subjective:     Patient ID: Marie Colon, female    DOB: 06-Jan-1956, 68 y.o.   MRN: [id number]  Chief Complaint  Patient presents with   Acute Visit    Right groin pain x 5 days Pain 4/10 today Standing & walking is painful Friday pain was 9/10    Discussed the use of AI scribe software for clinical note transcription with the patient, who gave verbal consent to proceed.  History of Present Illness SKYLAN GIFT is a 68 year old female who presents with groin pain.  Groin pain - Intermittent groin pain for the past month - Initially mild, significantly worsened after increased physical activity including decorating and preparing for Thanksgiving - Activities exacerbating pain included retrieving items from the attic, prolonged standing, and walking - Pain is most pronounced with standing and walking - Pain improves with rest - Sitting does not exacerbate the pain - Some discomfort at night while sleeping - Pain has improved since resting over the weekend  Left lower extremity swelling - New swelling in the left leg noticed on the morning of the visit - Swelling is not associated with pain - No recent trauma to the area - Family history of blood clots, causing concern about the swelling  Medication use and hepatic considerations - Uses ibuprofen as needed for pain     Review of Systems  Constitutional: Negative.   HENT: Negative.    Respiratory: Negative.    Cardiovascular: Negative.   Gastrointestinal: Negative.   Musculoskeletal:        Right groin pain  Skin:        Localized swelling over the lateral aspect of her left leg  Psychiatric/Behavioral: Negative.          Objective:    BP 124/82   Pulse 81   Temp 98.6 F (37 C)   Ht 5' 1 (1.549 m)   Wt 175 lb 6.4 oz (79.6 kg)   SpO2 97%   BMI 33.14 kg/m    Physical Exam Constitutional:      Appearance: Normal appearance.  HENT:     Head: Normocephalic and  atraumatic.  Cardiovascular:     Rate and Rhythm: Normal rate and regular rhythm.     Heart sounds: Normal heart sounds.  Pulmonary:     Effort: Pulmonary effort is normal.     Breath sounds: Normal breath sounds. No wheezing, rhonchi or rales.  Abdominal:     General: Bowel sounds are normal. There is no distension.     Palpations: Abdomen is soft.     Tenderness: There is no abdominal tenderness. There is no guarding or rebound.  Musculoskeletal:     Right lower leg: No edema.     Left lower leg: No edema.     Comments: Patient with tenderness to palpation in right groin in the right inguinal fold (at the midpoint).  Patient with mild tenderness to palpation over right trochanter  Patient with localized swelling over the lateral aspect of her left leg approximately 1 cm x 1 cm.  No erythema or tenderness to palpation noted.  Neurological:     Mental Status: She is alert.  Psychiatric:        Mood and Affect: Mood normal.        Behavior: Behavior normal.     No results found for any visits on 12/27/23.      Assessment & Plan:   Problem List Items Addressed  This Visit       Cardiovascular and Mediastinum   Hypertension   - This problem is chronic and stable -Blood pressure today is 124/82 -We will continue current medication regimen including metoprolol  25 mg twice daily  - No further workup at this time        Other   Groin pain, right - Primary   - Patient with initially mild pain in the right groin over the last month as she has had to go into the attic to take out decorations and takes lots out of In preparation for Thanksgiving -Patient states the pain acutely worsened on Friday after waking up.  She states that she was very active on Thursday with preparing and cleaning up after Thanksgiving meal -Denies any fevers or chills -On exam, patient has tenderness to palpation at the midpoint of the right inguinal fold -She also has some mild tenderness to palpation  of her right trochanter -I suspect patient likely has a muscle strain from her increased physical activity as well as possibly mild trochanteric bursitis -I will treat the patient with Robaxin to use 3 times a day as needed as well as scheduled ibuprofen 400 mg 3 times a day for 5 days to see if this will help with her symptoms -Will also obtain an x-ray of her right femur to rule any bony pathology but this is less likely -Will consider referring to physical therapy if she does not improve -Return precautions given to the patient -Patient also advised caution with using muscle relaxant - No further workup at this time      Relevant Medications   methocarbamol (ROBAXIN) 500 MG tablet   ibuprofen (ADVIL) 400 MG tablet   Localized swelling, mass, or lump of lower extremity, left   - Patient with mild localized swelling over the lateral aspect of the left lower extremity approximately 1 cm x 1 cm -No tenderness to palpation erythema noted -Patient states that she did have pain at that site for 1 to 2 days prior to the swelling that occurred this morning -I suspect patient likely had trauma to the site and that this should improve on its own -Warm compresses may help -No further workup at this time -Return precautions given to the patient       Meds ordered this encounter  Medications   methocarbamol (ROBAXIN) 500 MG tablet    Sig: Take 1 tablet (500 mg total) by mouth every 8 (eight) hours as needed for muscle spasms.    Dispense:  30 tablet    Refill:  0   ibuprofen (ADVIL) 400 MG tablet    Sig: Take 1 tablet (400 mg total) by mouth 3 (three) times daily.    Dispense:  15 tablet    Refill:  1    No follow-ups on file.  Bellanie Matthew, MD

## 2023-12-27 NOTE — Patient Instructions (Signed)
  VISIT SUMMARY: You came in today because of groin pain that has been bothering you for the past month and has worsened recently after increased physical activity. You also noticed new swelling in the left leg this morning, which is not painful but caused concern due to your family history of blood clots.  YOUR PLAN: -MUSCLE STRAIN OF HIP AND THIGH WITH POSSIBLE TROCHANTERIC BURSITIS: Your groin pain is likely due to a muscle strain and possible inflammation of the bursa (a fluid-filled sac that reduces friction) in your hip area. This was likely worsened by increased activity. There is no significant swelling or signs of a blood clot. We have ordered an x-ray to rule out any bone issues. You should take ibuprofen three times daily for five days to help reduce inflammation.  Will also give you muscle relaxer to use as needed.  If the pain does not improve with rest and medication, we may refer you to physical therapy.   Left leg swelling: I suspect that this is likely secondary to trauma.  Applying warm compresses can help reduce the swelling. Please return if the swelling worsens or if your symptoms change.  INSTRUCTIONS: Please follow up after your groin x-ray to discuss the results. Return sooner if your swelling worsens or if you notice any changes in your symptoms.                      Contains text generated by [employer].                                 Contains text generated by [employer].

## 2023-12-27 NOTE — Assessment & Plan Note (Signed)
-   Patient with mild localized swelling over the lateral aspect of the left lower extremity approximately 1 cm x 1 cm -No tenderness to palpation erythema noted -Patient states that she did have pain at that site for 1 to 2 days prior to the swelling that occurred this morning -I suspect patient likely had trauma to the site and that this should improve on its own -Warm compresses may help -No further workup at this time -Return precautions given to the patient

## 2023-12-27 NOTE — Telephone Encounter (Signed)
 FYI Only or Action Required?: Action required by provider: request for appointment and clinical question for provider.  Patient was last seen in primary care on 11/18/2023 by Glendia Shad, MD.  Called Nurse Triage reporting No chief complaint on file..  Symptoms began several days ago.  Interventions attempted: Rest, hydration, or home remedies.  Symptoms are: gradually worsening.  Triage Disposition: No disposition on file.  Patient/caregiver understands and will follow disposition?:    Copied from CRM [id number]. Topic: Clinical - Red Word Triage >> Dec 27, 2023  8:36 AM Marie Colon wrote: Red Word that prompted transfer to Nurse Triage: Patient has been experiencing groin pain as well as pain above the left ankle starting over the weekend. Reason for Disposition  [1] MODERATE pain (e.g., interferes with normal activities, limping) AND [2] high-risk adult (e.g., age > 60 years, osteoporosis, chronic steroid use)  Answer Assessment - Initial Assessment Questions 1. MECHANISM: How did the injury happen? (e.g., twisting injury, direct blow)      Unsure  2. ONSET: When did the injury happen? (e.g., minutes, hours ago)      Over a month, worse over the past week  3. LOCATION: Where is the injury located?      Right Side  4. APPEARANCE of INJURY: What does the injury look like?  (e.g., looks normal; bruise, swelling)     Appears normal  5. PAIN: Is there pain? If Yes, ask: How bad is the pain?   What does it keep you from doing? (Scale 0-10; or none, mild, moderate, severe)     Intermittent Pain, Varies in severity, during active pain periods 9  6. SIZE: For cuts, bruises, or swelling, ask: How large is it? (e.g., inches or centimeters;  entire joint)      Denies  7. TETANUS: For any breaks in the skin, ask: When was your last tetanus booster?     Unsure  8. OTHER SYMPTOMS: Do you have any other symptoms?      Denies  9. PREGNANCY: Is there any chance you  are pregnant? When was your last menstrual period?     No and No  Protocols used: Groin Injury and Strain-A-AH

## 2023-12-28 ENCOUNTER — Other Ambulatory Visit: Payer: Self-pay

## 2023-12-28 MED ORDER — CABERGOLINE 0.5 MG PO TABS
0.2500 mg | ORAL_TABLET | Freq: Every day | ORAL | 3 refills | Status: AC
  Filled 2024-01-25: qty 32, 64d supply, fill #0

## 2024-01-25 ENCOUNTER — Other Ambulatory Visit: Payer: Self-pay

## 2024-02-29 ENCOUNTER — Other Ambulatory Visit: Payer: Self-pay | Admitting: Internal Medicine

## 2024-03-01 ENCOUNTER — Other Ambulatory Visit: Payer: Self-pay | Admitting: Internal Medicine

## 2024-03-01 ENCOUNTER — Other Ambulatory Visit: Payer: Self-pay

## 2024-03-03 ENCOUNTER — Encounter: Payer: Self-pay | Admitting: Internal Medicine

## 2024-03-03 ENCOUNTER — Other Ambulatory Visit: Payer: Self-pay

## 2024-03-03 MED ORDER — LEVOTHYROXINE SODIUM 100 MCG PO TABS
100.0000 ug | ORAL_TABLET | Freq: Every day | ORAL | 0 refills | Status: AC
  Filled 2024-03-03: qty 90, 90d supply, fill #0

## 2024-03-03 MED ORDER — ATORVASTATIN CALCIUM 40 MG PO TABS
40.0000 mg | ORAL_TABLET | Freq: Every day | ORAL | 0 refills | Status: AC
  Filled 2024-03-03: qty 90, 90d supply, fill #0

## 2024-03-09 ENCOUNTER — Ambulatory Visit: Admitting: Dermatology

## 2024-03-22 ENCOUNTER — Other Ambulatory Visit

## 2024-03-24 ENCOUNTER — Ambulatory Visit: Admitting: Internal Medicine

## 2024-03-28 ENCOUNTER — Ambulatory Visit: Payer: Medicare Other
